# Patient Record
Sex: Male | Born: 1935 | Race: White | Hispanic: No | Marital: Married | Smoking: Never smoker
Health system: Southern US, Community
[De-identification: ages and names within clinical notes are randomized; demographics above are authoritative.]

## PROBLEM LIST (undated history)

## (undated) DIAGNOSIS — G43109 Migraine with aura, not intractable, without status migrainosus: Secondary | ICD-10-CM

## (undated) DIAGNOSIS — I639 Cerebral infarction, unspecified: Secondary | ICD-10-CM

## (undated) DIAGNOSIS — R519 Headache, unspecified: Secondary | ICD-10-CM

## (undated) DIAGNOSIS — K635 Polyp of colon: Secondary | ICD-10-CM

## (undated) DIAGNOSIS — N183 Chronic kidney disease, stage 3 unspecified: Secondary | ICD-10-CM

## (undated) DIAGNOSIS — E876 Hypokalemia: Secondary | ICD-10-CM

## (undated) DIAGNOSIS — E785 Hyperlipidemia, unspecified: Secondary | ICD-10-CM

## (undated) DIAGNOSIS — R51 Headache: Secondary | ICD-10-CM

## (undated) DIAGNOSIS — G43909 Migraine, unspecified, not intractable, without status migrainosus: Secondary | ICD-10-CM

## (undated) DIAGNOSIS — Z9289 Personal history of other medical treatment: Secondary | ICD-10-CM

## (undated) DIAGNOSIS — R42 Dizziness and giddiness: Secondary | ICD-10-CM

## (undated) DIAGNOSIS — I10 Essential (primary) hypertension: Secondary | ICD-10-CM

## (undated) DIAGNOSIS — R339 Retention of urine, unspecified: Secondary | ICD-10-CM

## (undated) DIAGNOSIS — I5189 Other ill-defined heart diseases: Secondary | ICD-10-CM

## (undated) DIAGNOSIS — M109 Gout, unspecified: Secondary | ICD-10-CM

## (undated) DIAGNOSIS — Z8673 Personal history of transient ischemic attack (TIA), and cerebral infarction without residual deficits: Secondary | ICD-10-CM

## (undated) DIAGNOSIS — R55 Syncope and collapse: Secondary | ICD-10-CM

## (undated) DIAGNOSIS — Z8669 Personal history of other diseases of the nervous system and sense organs: Secondary | ICD-10-CM

## (undated) DIAGNOSIS — R739 Hyperglycemia, unspecified: Secondary | ICD-10-CM

## (undated) DIAGNOSIS — I4892 Unspecified atrial flutter: Secondary | ICD-10-CM

## (undated) HISTORY — DX: Hyperlipidemia, unspecified: E78.5

## (undated) HISTORY — DX: Migraine, unspecified, not intractable, without status migrainosus: G43.909

## (undated) HISTORY — DX: Headache: R51

## (undated) HISTORY — DX: Cerebral infarction, unspecified: I63.9

## (undated) HISTORY — PX: EYE SURGERY: SHX253

## (undated) HISTORY — DX: Personal history of other medical treatment: Z92.89

## (undated) HISTORY — DX: Headache, unspecified: R51.9

## (undated) HISTORY — PX: COLONOSCOPY: SHX174

## (undated) HISTORY — DX: Polyp of colon: K63.5

## (undated) HISTORY — DX: Hyperglycemia, unspecified: R73.9

## (undated) HISTORY — DX: Unspecified atrial flutter: I48.92

---

## 1998-08-26 ENCOUNTER — Ambulatory Visit (HOSPITAL_COMMUNITY): Admission: RE | Admit: 1998-08-26 | Discharge: 1998-08-26 | Payer: Self-pay | Admitting: Gastroenterology

## 2002-11-02 ENCOUNTER — Emergency Department (HOSPITAL_COMMUNITY): Admission: EM | Admit: 2002-11-02 | Discharge: 2002-11-03 | Payer: Self-pay | Admitting: Emergency Medicine

## 2004-12-22 ENCOUNTER — Encounter (INDEPENDENT_AMBULATORY_CARE_PROVIDER_SITE_OTHER): Payer: Self-pay | Admitting: Specialist

## 2004-12-22 ENCOUNTER — Ambulatory Visit (HOSPITAL_COMMUNITY): Admission: RE | Admit: 2004-12-22 | Discharge: 2004-12-22 | Payer: Self-pay | Admitting: Gastroenterology

## 2005-11-01 ENCOUNTER — Encounter: Admission: RE | Admit: 2005-11-01 | Discharge: 2005-11-01 | Payer: Self-pay | Admitting: Internal Medicine

## 2007-02-17 DIAGNOSIS — Z8673 Personal history of transient ischemic attack (TIA), and cerebral infarction without residual deficits: Secondary | ICD-10-CM

## 2007-02-17 HISTORY — DX: Personal history of transient ischemic attack (TIA), and cerebral infarction without residual deficits: Z86.73

## 2007-03-02 ENCOUNTER — Emergency Department (HOSPITAL_COMMUNITY): Admission: EM | Admit: 2007-03-02 | Discharge: 2007-03-02 | Payer: Self-pay | Admitting: *Deleted

## 2007-03-04 ENCOUNTER — Ambulatory Visit: Payer: Self-pay | Admitting: *Deleted

## 2007-03-04 ENCOUNTER — Observation Stay (HOSPITAL_COMMUNITY): Admission: EM | Admit: 2007-03-04 | Discharge: 2007-03-05 | Payer: Self-pay | Admitting: Emergency Medicine

## 2011-01-31 NOTE — Consult Note (Signed)
NAME:  Adrian Melendez, Adrian Melendez NO.:  [id number]   MEDICAL RECORD NO.:  [id number]          PATIENT TYPE:  INP   LOCATION:  4707                         FACILITY:  [facility]   PHYSICIAN:  Deanna Artis. Hickling, M.D.DATE OF BIRTH:  Feb 05, 1935   DATE OF CONSULTATION:  03/04/2007  DATE OF DISCHARGE:                                 CONSULTATION   CHIEF COMPLAINT:  Left brain TIA.   HISTORY OF THE PRESENT CONDITION:  I was asked by Dr. Eloise Harman to see  Mr. Luckey, who is a 76 year old right-handed married gentleman, who  had a right vertex headache on Friday, February 28, 2005.  He took Tylenol  and went to sleep.  The next morning, while reading, he had some  problems with word-finding.  This lasted for no more than 5 minutes.  In  very quick sequence he then had paresthesias in the right hand and  forearm, and later in the corner of his right mouth.  The entire cluster  of behaviors lasted for no more than 30 minutes.   He believed that he had a TIA event and went to the hospital.  I have  reviewed the ER record and it only mentions numbness in the hand and  mouth; it does not mention the garbled speech on June 14.   The patient was sent home with a diagnosis of hypertension.  He was  advised to see his primary doctor.  He worried about his condition and  later that night had a migrainous aura followed by a mild to moderate  headache.  He decided to go to the emergency room where he was evaluated  by Dr. Lynelle Doctor, who elected to keep in the hospital until he could be seen  by Dr. Eloise Harman, who decided to admit him.   PAST MEDICAL HISTORY:  Remarkable for hypertension, dyslipidemia,  migraine with aura and migraine variants, colonic polyps.   PAST SURGICAL HISTORY:  None.   CURRENT MEDICATIONS:  1. Coreg 25 twice daily.  2. Zestoretic 40/25 daily.  3. KCl 40 mEq daily.  4. Norvasc 10 mg daily.   DRUG ALLERGIES:  None known.   FAMILY HISTORY:  Father died in his 32s during  World War II.  Mother  died in her 23s.  No history of early coronary artery disease, diabetes  mellitus, colon cancer.  There is a family history of dyslipidemia.   REVIEW OF SYSTEMS:  Positive for dry cough, rhinitis, chronic left  shoulder and ankle pain.  Twelve-system review is otherwise negative  except as noted above.   SOCIAL HISTORY:  The patient is married.  He has a 74 year old daughter.  He does not use alcohol.  He used tobacco years ago when he was young.  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  He has some abnormality in his right  eye that sounded vascular and was treated with laser.  He does not have  macular degeneration.   On admission he had a blood pressure 192/108.  It is considerably  improved.  Zestoretic was changed to Micardis/hydrochlorothiazide 80/25.  His fasting lipid panel was carried out and shows  evidence of  cholesterol of 248, triglyceride 210, HDL cholesterol 33, LDL 173, VLDL  42.  All three glucose determinations are slightly elevated in the 117-  120 range.  This suggests a mild glucose intolerance.  Cardiac enzymes  were negative.   I have reviewed his MRI scan of the brain and it shows significant white  matter disease, a bit more prominent on the left than the right.  He has  three separate lacunes that are cystic near the head of the left  caudate.  There are no acute lesions.  His vessels are ectatic and show  no evidence of occlusion in the intracranial contents.  Carotid Doppler  showed no hemodynamically significant stenosis in the carotids and  antegrade flow in the vertebrals.  A 2-D echocardiogram is pending at  this time.   EXAMINATION TODAY:  GENERAL:  This is a pleasant gentleman, no acute  distress.  VITAL SIGNS:  Blood pressure 132/82, resting pulse 57, respirations 20,  oxygen saturation 94% on room air.  Weight 100.7 kg. height 71 inches.  HEENT:  No bruits, meningismus, infection.  He has a supple neck.  LUNGS:  Clear.  HEART:   No murmurs, pulses normal.  ABDOMEN:  Soft, protuberant.  Bowel sounds normal.  No  hepatosplenomegaly.  EXTREMITIES:  Normal.  NEUROLOGIC EXAMINATION:  Mental status:  Awake, alert, without  dysphasia, dyspraxia, or dysarthria.  Cranial nerves:  Round reactive  pupils.  Visual fields full to double simultaneous stimuli.  Symmetric  facial strength.  Midline tongue.  Air conduction greater than bone  conduction.  Motor examination:  Normal strength.  Fine motor movements  normal.  No drift.  Sensation shows a mild peripheral polyneuropathy to  the stocking and glove distribution.  He has intact stereoagnosis.  Cerebellar examination:  Good finger-to-nose, rapid repetitive  movements.  Gait was normal.  Deep tendon reflexes are diminished.  The  patient had bilateral flexor plantar responses.   IMPRESSION:  1. Transient ischemic attack, left brain. (435.8)  2. Migraine with aura. (345.00)  3. Migraine variant.(345.20)  4. Hypertension. (404.10)  5. Dyslipidemia.  6. Mild glucose intolerance.  7. 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   PLAN:  I agree with treatment with aspirin, blood pressure control,  vigorous lipid control with a statin medication.  We will check  hemoglobin A1c.  The 2-D echocardiogram needs to be reviewed when it is  available.  The patient can be discharged tomorrow because his workup is  complete.   I appreciate the opportunity to participate in his care.  I will follow  him up at your request.      Deanna Artis. Sharene Skeans, M.D.  Electronically Signed     [facility]/MEDQ  D:  03/04/2007  T:  03/05/2007  Job:  [id number]

## 2011-02-03 NOTE — Op Note (Signed)
NAME:  Adrian Melendez, Adrian Melendez NO.:  [id number]   MEDICAL RECORD NO.:  [id number]          PATIENT TYPE:  AMB   LOCATION:  ENDO                         FACILITY:  [facility]   PHYSICIAN:  James L. Malon Kindle., M.D.DATE OF BIRTH:  [birth year]   DATE OF PROCEDURE:  12/22/2004  DATE OF DISCHARGE:                                 OPERATIVE REPORT   PROCEDURE:  Colonoscopy with polypectomy.   MEDICATIONS:  Fentanyl 100 mcg, Versed 8.5 mg IV.   INDICATIONS:  Previous history of adenomatous colon polyps.   DESCRIPTION OF PROCEDURE:  The procedure had been explained to the patient  and consent obtained.   With the patient in the left lateral decubitus position, the Olympus scope  was inserted and advanced.  We were able to advance to the cecum easily.  The ileocecal valve and appendiceal orifice were seen.  The  scope was  withdrawn and the cecum, ascending colon, transverse colon were seen well  and were normal.  No polyps were seen.  In the proximal descending colon, a  0.5 cm sessile polyp was removed through the snare and sucked through the  scope.  No other polyps were seen.  The remainder of the descending and  sigmoid colon were seen well.  No polyps were seen.  The rectum was free of  polyps.  The scope was withdrawn. The patient tolerated the procedure well.   ASSESSMENT:  Descending colon polyp removed.  211.43.   PLAN:  Routine post polypectomy instructions.  Will recommend repeating  procedure in five years and recommend yearly hemoccults.      JLE/MEDQ  D:  12/22/2004  T:  12/22/2004  Job:  [id number]   cc:   Wilson Singer, M.D.  [address]  [place]  [place] [address]  Fax: [phone]

## 2011-02-03 NOTE — Discharge Summary (Signed)
NAME:  Adrian Melendez, Adrian Melendez NO.:  [id number]   MEDICAL RECORD NO.:  [id number]          PATIENT TYPE:  INP   LOCATION:  4707                         FACILITY:  [facility]   PHYSICIAN:  Barry Dienes. Eloise Harman, M.D.DATE OF BIRTH:  1934-12-03   DATE OF ADMISSION:  03/04/2007  DATE OF DISCHARGE:  03/05/2007                               DISCHARGE SUMMARY   CHIEF COMPLAINT:  Headache and transient numbness of the right hand.   HISTORY OF PRESENT ILLNESS:  The patient is a 76 year old white male who  is right-handed and who on the March 01, 2007 in the evening had a  moderate headache for which she took Tylenol and went to sleep.  On  Saturday, June 14th in the a.m., he had transient word-finding  difficulty with numbness of the right hand and right lip.  He was seen  in the emergency room at that time, and it was felt that his symptoms  were due to a musculoskeletal etiology.  On last evening, he had  moderate headache with blurred vision, so he presented to the emergency  room for evaluation.  He was seen at the [facility] emergency room.  At  the time of my evaluation, he felt fine, was no longer having right hand  numbness or headache or difficulty speaking.   PAST MEDICAL HISTORY:  Hypertension, hyperlipidemia, classic ophthalmic  migraines for 15 years, colon polyps with 2006 colonoscopy.   MEDICATIONS:  Prior to admission:  1. Coreg 25 mg p.o. b.i.d.  2. Zestoretic 40/25 1 tablet p.o. q.a.m.  3. Potassium chloride 40 mEq daily.  4. Norvasc 10 mg daily.   ALLERGIES:  NO KNOWN DRUG ALLERGIES.   PAST SURGICAL HISTORY:  None.   SOCIAL HISTORY:  He is married and has one daughter, age 86 years.  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  He has no history of tobacco or alcohol  abuse.   FAMILY HISTORY:  His father died his 30s of injuries from World War II.  His mother died in her 79s.  He has a family history of hyperlipidemia,  but none of early coronary disease, diabetes  mellitus, or colon cancer.   REVIEW OF SYSTEMS:  Lately, he has had a dry cough, rhinitis, and has  chronic problems with aching in the left shoulder and left ankle, as  well as nocturia.  He denies recent fever, chills, shortness of breath,  chest pain, palpitations, nausea, vomiting, diarrhea, constipation,  anxiety, depression, or symptoms of obstructive sleep apnea.   INITIAL PHYSICAL EXAM:  VITAL SIGNS:  Blood pressure 192/108, pulse 56,  respirations 16, temperature 98.5, pulse oxygen saturation 95% on room  air.  GENERAL:  He is a mildly overweight white male, who had an occasional  dry cough.  HEENT:  Exam was within normal limits.  NECK:  Supple and without jugular venous distension or carotid bruit.  CHEST:  Clear to auscultation.  HEART:  Had a irregular rate and rhythm without significant murmur or  gallop.  ABDOMEN:  Normal bowel sounds and no hepatosplenomegaly or tenderness.  EXTREMITIES:  Trace right ankle edema.  NEUROLOGICAL:  He is alert and oriented x4, cranial nerves II-XII were  normal, sensory exam was grossly normal, he had intact finger-to-nose  testing bilaterally, motor strength was 5/5 throughout, gait was normal.   LABORATORY STUDIES:  Serum sodium 139, potassium 3.8, chloride 108, BUN  13, creatinine 1.0, glucose 117, white blood cells 6.8, hemoglobin 14,  hematocrit 43, platelets 292.  A CT scan of the head without IV contrast  was unremarkable.  EKG showed normal sinus rhythm   HOSPITAL COURSE:  The patient's symptoms were consistent with transient  ischemic attacks, so he was admitted to a medical bed with telemetry.  He was also seen by a neurology consultant who recommended initiation of  aspirin treatment, aggressive control of hypertension and elevated  lipids, as well as the imaging studies described below.  On June 16, she  had a transthoracic echocardiogram that showed the following:  Left  ventricular ejection fraction of 60-65%, mild left  ventricular  hypertrophy, mild aortic valve sclerosis without stenosis, left atrial  enlargement with left atrial diameter 45 mm, mild right ventricular  enlargement and mild right atrial enlargement.  He also had a carotid  ultrasound exam that showed bilaterally no evidence of ICA stenosis and  antegrade vertebral artery flow.  A MRI scan of the brain showed no  acute infarcts with old lacunar infarcts in the left caudate.  A MRA  scan of the brain showed possible left posterior cerebral artery  stenosis and mild dilatation of the basilar artery.  A fasting lipid  panel showed total cholesterol 248, with triglycerides 210, HDL 33, LDL  173.   COMPLICATIONS:  None.   CONDITION ON DISCHARGE:  He feels great and has not had any symptoms  suggestive of TIAs, during this hospitalization.  His telemetry has  showed a sinus rhythm throughout his stay.  Most recent vital signs  include blood pressure 141/80, pulse 60, respirations 18, temperature  97.5, pulse oxygen saturation 93% on room air.  His chest is clear to  auscultation.  His heart has a regular rate and rhythm.  His abdomen is  benign, and neurological exam shows no focal deficits.   DISCHARGE DIAGNOSES:  1. Transient ischemic attacks.  2. 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  3. Hypertension, essential, uncontrolled.  4. Hyperlipidemia.  5. Metabolic syndrome.  6. History of classic migraine headaches.  7. Colon polyps.   DISCHARGE MEDICATIONS:  1. Micardis HCT 80/25 1 tablet p.o. q.a.m.  2. Coreg 25 mg p.o. b.i.d.  3. Norvasc 10 mg p.o. daily.  4. Potassium chloride 40 mEq daily.  5. Ecotrin 325 mg daily.  6. Simvastatin 80 mg p.o. daily.  7. Tylenol 500 mg q.i.d. p.r.n. pain or headaches.   DISPOSITION AND FOLLOWUP:  The patient will be seen in followup by Dr.  Jarome Matin at [facility], within 1 week following  discharge.  He was also advised to schedule a follow-up appointment with  Dr.  Ellison Carwin at [facility], in 2 to 4 weeks  following discharge and was given a telephone number to schedule an  appointment.           ______________________________  Barry Dienes. Eloise Harman, M.D.     DGP/MEDQ  D:  04/30/2007  T:  05/01/2007  Job:  [id number]   cc:   Fayrene Fearing L. Malon Kindle., M.D.  Lyn Records, M.D.  Deanna Artis. Sharene Skeans, M.D.

## 2011-07-05 LAB — CBC
HCT: 43.3
Hemoglobin: 14.8
MCV: 91.2
RBC: 4.75
WBC: 6.8

## 2011-07-05 LAB — DIFFERENTIAL
Basophils Absolute: 0
Eosinophils Absolute: 0.2
Lymphs Abs: 1.6
Monocytes Relative: 9
Neutro Abs: 4.4
Neutrophils Relative %: 64

## 2011-07-05 LAB — COMPREHENSIVE METABOLIC PANEL
ALT: 29
AST: 34
BUN: 10
CO2: 26
Calcium: 9.3
GFR calc non Af Amer: >60
Total Bilirubin: 1
Total Protein: 7.4

## 2011-07-05 LAB — URINALYSIS, ROUTINE W REFLEX MICROSCOPIC
Glucose, UA: NEGATIVE
Nitrite: NEGATIVE
pH: 6.5

## 2011-07-05 LAB — I-STAT 8, (EC8 V) (CONVERTED LAB)
Acid-Base Excess: 1
Bicarbonate: 24.2 — ABNORMAL HIGH
Chloride: 108
Glucose, Bld: 117 — ABNORMAL HIGH
HCT: 44
TCO2: 25

## 2011-07-05 LAB — LIPID PANEL: Total CHOL/HDL Ratio: 7.5

## 2011-08-03 ENCOUNTER — Other Ambulatory Visit: Payer: Self-pay | Admitting: Gastroenterology

## 2012-02-29 ENCOUNTER — Encounter (HOSPITAL_COMMUNITY): Payer: Self-pay | Admitting: *Deleted

## 2012-02-29 ENCOUNTER — Observation Stay (HOSPITAL_COMMUNITY): Payer: Medicare Other

## 2012-02-29 ENCOUNTER — Observation Stay (HOSPITAL_COMMUNITY)
Admission: EM | Admit: 2012-02-29 | Discharge: 2012-03-01 | Disposition: A | Discharge status: Home / Self Care | Payer: Medicare Other | Source: Ambulatory Visit | Attending: Cardiovascular Disease | Admitting: Cardiovascular Disease

## 2012-02-29 DIAGNOSIS — R55 Syncope and collapse: Principal | ICD-10-CM

## 2012-02-29 DIAGNOSIS — I10 Essential (primary) hypertension: Secondary | ICD-10-CM | POA: Insufficient documentation

## 2012-02-29 DIAGNOSIS — R110 Nausea: Secondary | ICD-10-CM | POA: Insufficient documentation

## 2012-02-29 DIAGNOSIS — E876 Hypokalemia: Secondary | ICD-10-CM | POA: Insufficient documentation

## 2012-02-29 DIAGNOSIS — R42 Dizziness and giddiness: Secondary | ICD-10-CM

## 2012-02-29 DIAGNOSIS — Z8673 Personal history of transient ischemic attack (TIA), and cerebral infarction without residual deficits: Secondary | ICD-10-CM | POA: Insufficient documentation

## 2012-02-29 DIAGNOSIS — M109 Gout, unspecified: Secondary | ICD-10-CM | POA: Insufficient documentation

## 2012-02-29 HISTORY — DX: Personal history of transient ischemic attack (TIA), and cerebral infarction without residual deficits: Z86.73

## 2012-02-29 HISTORY — DX: Personal history of other diseases of the nervous system and sense organs: Z86.69

## 2012-02-29 HISTORY — DX: Essential (primary) hypertension: I10

## 2012-02-29 HISTORY — DX: Dizziness and giddiness: R42

## 2012-02-29 HISTORY — DX: Hypokalemia: E87.6

## 2012-02-29 HISTORY — DX: Syncope and collapse: R55

## 2012-02-29 HISTORY — DX: Gout, unspecified: M10.9

## 2012-02-29 LAB — BASIC METABOLIC PANEL
Chloride: 100 mEq/L (ref 96–112)
Creatinine, Ser: 1.01 mg/dL (ref 0.50–1.35)
GFR calc Af Amer: 82 mL/min — ABNORMAL LOW (ref >90)
GFR calc non Af Amer: 71 mL/min — ABNORMAL LOW (ref >90)
Glucose, Bld: 120 mg/dL — ABNORMAL HIGH (ref 70–99)
Potassium: 3.2 mEq/L — ABNORMAL LOW (ref 3.5–5.1)
Sodium: 139 mEq/L (ref 135–145)

## 2012-02-29 LAB — CBC
HCT: 41.4 % (ref 39.0–52.0)
HCT: 40 % (ref 39.0–52.0)
Hemoglobin: 14.2 g/dL (ref 13.0–17.0)
MCH: 31.8 pg (ref 26.0–34.0)
MCH: 31.7 pg (ref 26.0–34.0)
MCHC: 35.7 g/dL (ref 30.0–36.0)
Platelets: 210 10*3/uL (ref 150–400)
RBC: 4.65 MIL/uL (ref 4.22–5.81)
RBC: 4.48 MIL/uL (ref 4.22–5.81)
WBC: 9 10*3/uL (ref 4.0–10.5)

## 2012-02-29 LAB — CARDIAC PANEL(CRET KIN+CKTOT+MB+TROPI)
CK, MB: 2.3 ng/mL (ref 0.3–4.0)
Troponin I: <0.3 ng/mL (ref <0.30)

## 2012-02-29 LAB — CREATININE, SERUM: Creatinine, Ser: 0.99 mg/dL (ref 0.50–1.35)

## 2012-02-29 LAB — DIFFERENTIAL
Eosinophils Absolute: 0.1 10*3/uL (ref 0.0–0.7)
Eosinophils Relative: 1 % (ref 0–5)
Monocytes Relative: 6 % (ref 3–12)

## 2012-02-29 LAB — HEPATIC FUNCTION PANEL
Alkaline Phosphatase: 102 U/L (ref 39–117)
Indirect Bilirubin: 0.4 mg/dL (ref 0.3–0.9)
Total Bilirubin: 0.5 mg/dL (ref 0.3–1.2)
Total Protein: 7.2 g/dL (ref 6.0–8.3)

## 2012-02-29 LAB — PROTIME-INR
INR: 1.08 (ref 0.00–1.49)
Prothrombin Time: 14.2 seconds (ref 11.6–15.2)

## 2012-02-29 LAB — POCT I-STAT TROPONIN I

## 2012-02-29 MED ORDER — HYDROCHLOROTHIAZIDE 25 MG PO TABS
25.0000 mg | ORAL_TABLET | Freq: Every day | ORAL | Status: DC
  Administered 2012-03-01: 25 mg via ORAL
  Filled 2012-02-29: qty 1

## 2012-02-29 MED ORDER — AMLODIPINE BESYLATE 10 MG PO TABS
10.0000 mg | ORAL_TABLET | Freq: Every day | ORAL | Status: DC
  Administered 2012-02-29 – 2012-03-01 (×2): 10 mg via ORAL
  Filled 2012-02-29 (×2): qty 1

## 2012-02-29 MED ORDER — SODIUM CHLORIDE 0.9 % IV SOLN: INTRAVENOUS | Status: DC

## 2012-02-29 MED ORDER — TELMISARTAN-HCTZ 80-25 MG PO TABS: 1.0000 | ORAL_TABLET | Freq: Every day | ORAL | Status: DC

## 2012-02-29 MED ORDER — ONDANSETRON HCL 4 MG/2ML IJ SOLN: 4.0000 mg | Freq: Four times a day (QID) | INTRAMUSCULAR | Status: DC | PRN

## 2012-02-29 MED ORDER — POTASSIUM CHLORIDE CRYS ER 20 MEQ PO TBCR
40.0000 meq | EXTENDED_RELEASE_TABLET | Freq: Once | ORAL | Status: AC
  Administered 2012-02-29: 40 meq via ORAL

## 2012-02-29 MED ORDER — ASPIRIN 81 MG PO CHEW
324.0000 mg | CHEWABLE_TABLET | ORAL | Status: AC
  Administered 2012-02-29: 324 mg via ORAL
  Filled 2012-02-29: qty 4

## 2012-02-29 MED ORDER — ONDANSETRON HCL 4 MG/2ML IJ SOLN
INTRAMUSCULAR | Status: AC
  Administered 2012-02-29: 14:00:00
  Filled 2012-02-29: qty 2

## 2012-02-29 MED ORDER — SODIUM CHLORIDE 0.9 % IV BOLUS (SEPSIS)
500.0000 mL | Freq: Once | INTRAVENOUS | Status: AC
  Administered 2012-02-29: 500 mL via INTRAVENOUS

## 2012-02-29 MED ORDER — ASPIRIN EC 81 MG PO TBEC
81.0000 mg | DELAYED_RELEASE_TABLET | Freq: Every day | ORAL | Status: DC
  Administered 2012-03-01: 81 mg via ORAL
  Filled 2012-02-29: qty 1

## 2012-02-29 MED ORDER — ACETAMINOPHEN 325 MG PO TABS: 650.0000 mg | ORAL_TABLET | ORAL | Status: DC | PRN

## 2012-02-29 MED ORDER — IRBESARTAN 300 MG PO TABS
300.0000 mg | ORAL_TABLET | Freq: Every day | ORAL | Status: DC
  Administered 2012-03-01: 300 mg via ORAL
  Filled 2012-02-29: qty 1

## 2012-02-29 MED ORDER — ALPRAZOLAM 0.25 MG PO TABS: 0.2500 mg | ORAL_TABLET | Freq: Two times a day (BID) | ORAL | Status: DC | PRN

## 2012-02-29 MED ORDER — METOPROLOL TARTRATE 12.5 MG HALF TABLET
12.5000 mg | ORAL_TABLET | Freq: Two times a day (BID) | ORAL | Status: DC
  Administered 2012-02-29 – 2012-03-01 (×2): 12.5 mg via ORAL
  Filled 2012-02-29 (×3): qty 1

## 2012-02-29 MED ORDER — ALLOPURINOL 150 MG HALF TABLET
450.0000 mg | ORAL_TABLET | Freq: Every day | ORAL | Status: DC
  Administered 2012-02-29 – 2012-03-01 (×2): 450 mg via ORAL
  Filled 2012-02-29 (×2): qty 1

## 2012-02-29 MED ORDER — POTASSIUM CHLORIDE CRYS ER 20 MEQ PO TBCR
20.0000 meq | EXTENDED_RELEASE_TABLET | Freq: Every day | ORAL | Status: DC
  Administered 2012-03-01: 20 meq via ORAL
  Filled 2012-02-29 (×3): qty 1

## 2012-02-29 MED ORDER — ASPIRIN 300 MG RE SUPP
300.0000 mg | RECTAL | Status: AC
  Filled 2012-02-29: qty 1

## 2012-02-29 MED ORDER — ONDANSETRON HCL 4 MG/2ML IJ SOLN
4.0000 mg | Freq: Once | INTRAMUSCULAR | Status: AC
  Administered 2012-02-29: 4 mg via INTRAVENOUS
  Filled 2012-02-29: qty 2

## 2012-02-29 MED ORDER — NITROGLYCERIN 0.4 MG SL SUBL: 0.4000 mg | SUBLINGUAL_TABLET | SUBLINGUAL | Status: DC | PRN

## 2012-02-29 MED ORDER — ZOLPIDEM TARTRATE 5 MG PO TABS: 5.0000 mg | ORAL_TABLET | Freq: Every evening | ORAL | Status: DC | PRN

## 2012-02-29 MED ORDER — HEPARIN SODIUM (PORCINE) 5000 UNIT/ML IJ SOLN
5000.0000 [IU] | Freq: Three times a day (TID) | INTRAMUSCULAR | Status: DC
  Administered 2012-02-29 – 2012-03-01 (×3): 5000 [IU] via SUBCUTANEOUS
  Filled 2012-02-29 (×5): qty 1

## 2012-02-29 NOTE — H&P (Signed)
Adrian Melendez is an 77 y.o. male.    PCP Dr. Ivery Quale  Chief Complaint: near syncope with lightheadedness and dizziness HPI: 77 year old male presents to the ER with weakness, dizziness, nausea and near syncope. He has a history of HTN. Remote work up many years ago with stress test that was normal.  Also with hyperlipidemia but statins have caused leg weakness.  He has tried several statins and currently is not taking any.  After receiving medication (zofran) for nausea his nausea and dizziness have resolved.  He does have occ. PVC on monitor-he stated that he has had premature beats for years.  Today he felt well until sudden onset of lightheadedness.  This increased, especially if he looked into the sun.  He stated that these symptoms today are different than previous migraines, TIA, and CVA.   Denies any chest pain, no SOB.  EMS was called and pt. Presented to ER.  EKG SR, no acute EKG changes, occ. PVC on second EKG.  First EKG with static on baseline, but was SR.  Initial troponin I is negative.   Past Medical History  Diagnosis Date  . Hypertension   . Gout   . Dizziness 02/29/2012  . Syncope, near 02/29/2012  . HTN (hypertension) 02/29/2012  . Hx-TIA (transient ischemic attack) 02/29/2012  . CVA, old, alterations of sensations   . Hx of migraines     History reviewed. No pertinent past surgical history.  History reviewed. No pertinent family history. His father died in his 59's from injuries in WWII.  Mother died in her 46's, unsure of cause of death.  His grandfather did die with MI, one sister with elevated cholesterol.  Social History:  reports that he has never smoked. He does not have any smokeless tobacco history on file. He reports that he does not drink alcohol or use illicit drugs. 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   Allergies: No Known Allergies  Outpatient Medications:  Allopurinol 300 mg daily ? 450 daily Norvasc 10 mg  daily Colchicine 0.6 mg prn  Naprosyn 500 mg prn arthritis pain Micardis/hct 80-25mg  1 daily K+  But he is not taking currently.   Results for orders placed during the hospital encounter of 02/29/12 (from the past 48 hour(s))  CBC     Status: Normal   Collection Time   02/29/12  3:10 PM      Component Value Range Comment   WBC 9.0  4.0 - 10.5 K/uL    RBC 4.65  4.22 - 5.81 MIL/uL    Hemoglobin 14.8  13.0 - 17.0 g/dL    HCT 16.1  09.6 - 04.5 %    MCV 89.0  78.0 - 100.0 fL    MCH 31.8  26.0 - 34.0 pg    MCHC 35.7  30.0 - 36.0 g/dL    RDW 40.9  81.1 - 91.4 %    Platelets 210  150 - 400 K/uL   DIFFERENTIAL     Status: Abnormal   Collection Time   02/29/12  3:10 PM      Component Value Range Comment   Neutrophils Relative 80 (*) 43 - 77 %    Neutro Abs 7.2  1.7 - 7.7 K/uL    Lymphocytes Relative 13  12 - 46 %    Lymphs Abs 1.1  0.7 - 4.0 K/uL    Monocytes Relative 6  3 - 12 %    Monocytes Absolute 0.6  0.1 - 1.0 K/uL    Eosinophils Relative 1  0 - 5 %    Eosinophils Absolute 0.1  0.0 - 0.7 K/uL    Basophils Relative 0  0 - 1 %    Basophils Absolute 0.0  0.0 - 0.1 K/uL   BASIC METABOLIC PANEL     Status: Abnormal   Collection Time   02/29/12  3:10 PM      Component Value Range Comment   Sodium 139  135 - 145 mEq/L    Potassium 3.2 (*) 3.5 - 5.1 mEq/L    Chloride 100  96 - 112 mEq/L    CO2 20  19 - 32 mEq/L    Glucose, Bld 120 (*) 70 - 99 mg/dL    BUN 17  6 - 23 mg/dL    Creatinine, Ser 1.61  0.50 - 1.35 mg/dL    Calcium 9.6  8.4 - 09.6 mg/dL    GFR calc non Af Amer 71 (*) >90 mL/min    GFR calc Af Amer 82 (*) >90 mL/min   POCT I-STAT TROPONIN I     Status: Normal   Collection Time   02/29/12  3:21 PM      Component Value Range Comment   Troponin i, poc 0.00  0.00 - 0.08 ng/mL    Comment 3             No results found.  ROS: General:no colds or fevers Skin:no rashes or ulcers HEENT:no blurred vision no double vision CV:no chest pain PUL:no SOB GI:no diarhhea, no  constipation, no melena  GU:no hematuria, no dysuria MS:hx of gout and arthritis none now Neuro:no syncope Endo:no diabetes, no thyroid disease   Blood pressure 155/80, pulse 75, temperature 97.6 F (36.4 C), temperature source Oral, resp. rate 16, SpO2 95.00%. PE: General:alert and oriented , pleasant affect, no acute distress Skin:W&D, brisk capillary refill HEENT:normocephalic, sclera clear Neck:supple, no JVD, no bruits Heart:S1S2 RRR with occ. Premature beats, soft systolic murmur 2/6 sem lsb Lungs:harsh breath sounds, no rales. No wheezes EAV:WUJWJ soft non tender very prominent diastasis recti Ext:no edema, 2+ pedal pulses bil. Neuro:A&OX 3, MAE, + facial symmetry, grips, equal, push pull of arms and legs equal.     Assessment/Plan Patient Active Problem List  Diagnosis  . Dizziness associated with nausea  . Syncope, near  . HTN (hypertension)  . Hx-TIA (transient ischemic attack)   PLAN: Admit to tele, monitor, serial CKMBs 2 D echo, VTE pro.   INGOLD,LAURA R 02/29/2012, 4:32 PM   Patient seen and examined. Agree with assessment and plan. 77 yr old WM who presents today after experiencing dizziness and lightheadeness without frank vertigo. This occurred while in car. He was unaware of any rhythm disturbance. No associated paresthesias or weakness. ECG and monitor have shown isolated PAC's and PVC's in ER. He is hypokalemic which may be contributing to arrhythmia; check Mg.  Will admit for observation. 2d echo to assess murmur for which he was unaware. He has h/o TIA/CVA wiill obtain head CT. Doubt ischemic. Will emperically add low dose beta-blocker.   Lennette Bihari, MD, [facility] 02/29/2012 5:11 PM

## 2012-02-29 NOTE — ED Notes (Addendum)
Pt. Reports sudden onset of dizziness, lightheadedness and nausea apporx 1300. Pt states " I feel lightheaded sometimes, from medication, but never like this.  Denies SOB. Denies Chest pain. Sensitive to light. Reports "seeing spots sometimes". Denies headache.  A. O. X 4. NAD. Hxt of hypertension.

## 2012-02-29 NOTE — ED Provider Notes (Signed)
History     CSN: [id number]  Arrival date & time 02/29/12  1322   First MD Initiated Contact with Patient 02/29/12 1408      Chief Complaint  Patient presents with  . Dizziness  . Nausea    Patient is a 77 y.o. male presenting with weakness. The history is provided by the patient and a relative.  Weakness The primary symptoms include dizziness and nausea. Primary symptoms do not include headaches, syncope, loss of consciousness, visual change, focal weakness or vomiting. Episode onset: several hours ago. The symptoms are unchanged. The neurological symptoms are diffuse.  Dizziness also occurs with nausea and weakness. Dizziness does not occur with vomiting.   Additional symptoms include weakness.  worsened by - movement Improved with rest  Pt reports onset of dizziness, feeling faint and nausea Denies cp/sob.  Denies headache.  Denies focal weakness.  No visual change.  Denies vertigo   Past Medical History  Diagnosis Date  . Hypertension   . Gout     History reviewed. No pertinent past surgical history.  History reviewed. No pertinent family history.  History  Substance Use Topics  . Smoking status: Never Smoker   . Smokeless tobacco: Not on file  . Alcohol Use: No      Review of Systems  Cardiovascular: Negative for syncope.  Gastrointestinal: Positive for nausea. Negative for vomiting.  Neurological: Positive for dizziness and weakness. Negative for focal weakness, loss of consciousness and headaches.  All other systems reviewed and are negative.    Allergies  Review of patient's allergies indicates no known allergies.  Home Medications   Current Outpatient Rx  Name Route Sig Dispense Refill  . NAPROXEN 250 MG PO TABS Oral Take 250 mg by mouth daily as needed. For pain      BP 160/70  Pulse 79  Temp 97.6 F (36.4 C) (Oral)  Resp 16  SpO2 100%  Physical Exam CONSTITUTIONAL: Well developed/well nourished HEAD AND FACE:  Normocephalic/atraumatic EYES: EOMI/PERRL ENMT: Mucous membranes moist NECK: supple no meningeal signs SPINE:entire spine nontender CV: S1/S2 noted, no murmurs/rubs/gallops noted LUNGS: Lungs are clear to auscultation bilaterally, no apparent distress ABDOMEN: soft, nontender, no rebound or guarding GU:no cva tenderness NEURO: Pt is awake/alert, moves all extremitiesx4 No arm/leg drift Face is symmetric No past pointing EXTREMITIES: pulses normal, full ROM SKIN: warm, color normal PSYCH: no abnormalities of mood noted  ED Course  Procedures   Labs Reviewed  DIFFERENTIAL - Abnormal; Notable for the following:    Neutrophils Relative 80 (*)     All other components within normal limits  CBC  POCT I-STAT TROPONIN I  BASIC METABOLIC PANEL  pt now improved but did report near syncopal event He is known to [facility], will admit to their service D/w PA Annie Paras to see in ED   MDM  Nursing notes including past medical history and social history reviewed and considered in documentation All labs/vitals reviewed and considered      Date: 02/29/2012  Rate: 78  Rhythm: normal sinus rhythm  QRS Axis: normal  Intervals: normal  ST/T Wave abnormalities: nonspecific ST changes  Conduction Disutrbances:none  Narrative Interpretation:   Old EKG Reviewed: changes noted Artifact noted, but appears to be sinus rhythm      Joya Gaskins, MD 02/29/12 [phone]

## 2012-02-29 NOTE — ED Notes (Signed)
Pt. Reports decreased nausea. Denies lightheadedness and dizziness.

## 2012-02-29 NOTE — ED Notes (Signed)
Pt from home-was driving when he had sudden onset of Nausea-no vomiting this am.  Reports dizziness with movement.  No CP/SOB.  Vitals stable-150s systolic.  CBG 87.  20Lwrist.  4mg  zofran.

## 2012-02-29 NOTE — ED Notes (Signed)
Pt reports driving, having a sudden onset of nausea-denies vomiting.  Pt reports being dizzy with movement and having increased dizziness with light.  Pt denies abdominal pain or chest pain.  No acute distress noted.

## 2012-02-29 NOTE — ED Notes (Signed)
Pt helped to stand at bedside with little assistance to use urinal.  Pt returned to bed with bed rails up and locked

## 2012-03-01 HISTORY — PX: TRANSTHORACIC ECHOCARDIOGRAM: SHX275

## 2012-03-01 LAB — HEMOGLOBIN A1C
Hgb A1c MFr Bld: 5.9 % — ABNORMAL HIGH (ref <5.7)
Mean Plasma Glucose: 123 mg/dL — ABNORMAL HIGH (ref <117)

## 2012-03-01 LAB — CARDIAC PANEL(CRET KIN+CKTOT+MB+TROPI)
CK, MB: 2.8 ng/mL (ref 0.3–4.0)
CK, MB: 2.8 ng/mL (ref 0.3–4.0)
Total CK: 93 U/L (ref 7–232)
Total CK: 122 U/L (ref 7–232)
Troponin I: <0.3 ng/mL (ref <0.30)
Troponin I: <0.3 ng/mL (ref <0.30)

## 2012-03-01 LAB — BASIC METABOLIC PANEL
CO2: 26 mEq/L (ref 19–32)
Chloride: 103 mEq/L (ref 96–112)
GFR calc non Af Amer: 66 mL/min — ABNORMAL LOW (ref >90)
Glucose, Bld: 100 mg/dL — ABNORMAL HIGH (ref 70–99)
Potassium: 3.3 mEq/L — ABNORMAL LOW (ref 3.5–5.1)
Sodium: 143 mEq/L (ref 135–145)

## 2012-03-01 LAB — CBC
HCT: 41.5 % (ref 39.0–52.0)
Hemoglobin: 14.1 g/dL (ref 13.0–17.0)
MCHC: 34 g/dL (ref 30.0–36.0)
RBC: 4.57 MIL/uL (ref 4.22–5.81)
WBC: 7 10*3/uL (ref 4.0–10.5)

## 2012-03-01 LAB — LIPID PANEL
Cholesterol: 212 mg/dL — ABNORMAL HIGH (ref 0–200)
LDL Cholesterol: 134 mg/dL — ABNORMAL HIGH (ref 0–99)
VLDL: 43 mg/dL — ABNORMAL HIGH (ref 0–40)

## 2012-03-01 MED ORDER — POTASSIUM CHLORIDE CRYS ER 20 MEQ PO TBCR: 20.0000 meq | EXTENDED_RELEASE_TABLET | Freq: Every day | ORAL | Status: AC

## 2012-03-01 MED ORDER — METOPROLOL TARTRATE 25 MG PO TABS: 12.5000 mg | ORAL_TABLET | Freq: Two times a day (BID) | ORAL | Status: DC

## 2012-03-01 MED ORDER — ASPIRIN 81 MG PO TBEC: 81.0000 mg | DELAYED_RELEASE_TABLET | Freq: Every day | ORAL | Status: AC

## 2012-03-01 MED ORDER — POTASSIUM CHLORIDE CRYS ER 20 MEQ PO TBCR
40.0000 meq | EXTENDED_RELEASE_TABLET | Freq: Once | ORAL | Status: AC
  Administered 2012-03-01: 40 meq via ORAL
  Filled 2012-03-01: qty 2

## 2012-03-01 NOTE — Discharge Instructions (Signed)
Heart Healthy Diet.  Follow up with Dr. Jarold Motto in 1-2 weeks.  Call our office if any further problems before your appointments  Have blood work done Monday or Tuesday to check potassium, at Dr. Norval Gable office or the 1st floor of Dr. Hazle Coca office after you come to have monitor placed.

## 2012-03-01 NOTE — Progress Notes (Signed)
Discharge instructions given to pt and family. Pt verbalized understanding. Ready for discharge

## 2012-03-01 NOTE — Discharge Summary (Signed)
Physician Discharge Summary  Patient ID: Adrian Melendez MRN: [id number] DOB/AGE: [birth year] 77 y.o.  Admit date: 02/29/2012 Discharge date: 03/01/2012  Discharge Diagnoses:  Principal Problem:  *Syncope, near Active Problems:  Dizziness associated with nausea  Hypokalemia  HTN (hypertension)  Hx-TIA (transient ischemic attack)   Discharged Condition: good  Hospital Course:  77 year old male presents to the ER with weakness, dizziness, nausea and near syncope.  He has a history of HTN. Remote work up many years ago with stress test that was normal. Also with hyperlipidemia but statins have caused leg weakness. He has tried several statins and currently is not taking any. After receiving medication (zofran) for nausea his nausea and dizziness have resolved. He does have occ. PVC on monitor-he stated that he has had premature beats for years.  Day of admit he felt well until sudden onset of lightheadedness. This increased, especially if he looked into the sun. He stated that these symptoms today are different than previous migraines, TIA, and CVA. Denies any chest pain, no SOB. EMS was called and pt. Presented to ER.  EKG SR, no acute EKG changes, occ. PVC on second EKG. First EKG with static on baseline, but was SR.   Patient was admitted for observation overnight where he maintained sinus rhythm with occasional PVCs.  He had no further dizziness lightheadedness no chest pain at all and no shortness of breath.  CT of the head was negative for acute process.  Cardiac enzymes are negative. Patient was seen by Dr. Allyson Sabal.  2-D echo of the heart was within normal limits patient was felt stable and ready for discharge home he'll obtain an event monitor from our office on Monday, June 17 and then follow up with Dr. Allyson Sabal.  Patient was hypokalemic and replaced twice during hospitalization.  Additionally his LDL continues to be elevated at 134 he has been intolerant to statins.  Consults:  None  Significant Diagnostic Studies:  2D echo:  Study Conclusions  - Left ventricle: The cavity size was normal. Wall thickness was normal. Systolic function was normal. The estimated ejection fraction was in the range of 55% to 60%. Wall motion was normal; there were no regional wall motion abnormalities. Left ventricular diastolic function parameters were normal. - Aortic valve: Mild regurgitation. - Mitral valve: Calcified annulus.  CT of the head 02/29/2012:  IMPRESSION:  Chronic atrophy and microvascular ischemic changes. No interval  change or acute process   Chest x-ray was done cardiomegaly without heart failure.  Laboratory data sodium 143 potassium 3.3 chloride 103 CO2 26 BUN 15 creatinine 1.07 calcium 9.3 magnesium 1.5 LFTs were normal Cardiac enzymes were negative with troponin I less than 0.30x3  Total cholesterol 212 triglycerides 216 HDL 35 LDL 134  Hemoglobin 14.1 hematocrit 41.5 WBC 7.0 platelets 223  Hemoglobin A1c 5.9 TSH 1.857  Discharge Exam: Blood pressure 136/72, pulse 55, temperature 97.3 F (36.3 C), temperature source Oral, resp. rate 16, height 5\' 11"  (1.803 m), weight 100.699 kg (222 lb), SpO2 94.00%.    General appearance: alert, cooperative and appears stated age  Neck: no adenopathy, no carotid bruit, no JVD, supple, symmetrical, trachea midline and thyroid not enlarged, symmetric, no tenderness/mass/nodules  Lungs: clear to auscultation bilaterally  Heart: regular rate and rhythm, S1, S2 normal, no murmur, click, rub or gallop  Extremities: extremities normal, atraumatic, no cyanosis or edema  Pulses: 2+ and symmetric    Disposition:  home   Medication List  As of 03/01/2012  4:43 PM  TAKE these medications         allopurinol 300 MG tablet   Commonly known as: ZYLOPRIM   Take 450 mg by mouth daily.      amLODipine 10 MG tablet   Commonly known as: NORVASC   Take 10 mg by mouth daily.      aspirin 81 MG EC tablet   Take 1  tablet (81 mg total) by mouth daily.      colchicine 0.6 MG tablet   Take 0.6 mg by mouth daily.      metoprolol tartrate 25 MG tablet   Commonly known as: LOPRESSOR   Take 0.5 tablets (12.5 mg total) by mouth 2 (two) times daily.      naproxen 250 MG tablet   Commonly known as: NAPROSYN   Take 500 mg by mouth daily as needed. For pain      potassium chloride SA 20 MEQ tablet   Commonly known as: K-DUR,KLOR-CON   Take 1 tablet (20 mEq total) by mouth daily.      telmisartan-hydrochlorothiazide 80-25 MG per tablet   Commonly known as: MICARDIS HCT   Take 1 tablet by mouth daily.           Follow-up Information    Follow up with Runell Gess, MD on 03/04/2012. (to place heart monitor, at 3:00 pm)    Contact information:   [address] [place] [address] [phone]       Follow up with Runell Gess, MD on 03/15/2012. (at  4:00 pm    to see Dr. Allyson Sabal for follow up.)    Contact information:   [address] [place] [address] [phone]         DISCHARGE INSTRUCTIONS: Heart Healthy Diet.  Follow up with Dr. Jarold Motto in 1-2 weeks.  Call our office if any further problems before your appointments  Have blood work done Monday or Tuesday to check potassium, at Dr. Norval Gable office or the 1st floor of Dr. Hazle Coca office after you come to have monitor placed.       SignedLeone Brand 03/01/2012, 4:43 PM

## 2012-03-01 NOTE — Progress Notes (Signed)
Subjective:  No CP/SOB or recurrent dizziness  Objective:  Temp:  [97.6 F (36.4 C)-99.2 F (37.3 C)] 98.6 F (37 C) (06/14 0425) Pulse Rate:  [55-80] 55  (06/14 0425) Resp:  [16-18] 18  (06/14 0425) BP: (155-175)/(70-80) 158/75 mmHg (06/14 0425) SpO2:  [92 %-100 %] 93 % (06/14 0425) Weight:  [100.699 kg (222 lb)-102.2 kg (225 lb 5 oz)] 100.699 kg (222 lb) (06/14 0425) Weight change:   Intake/Output from previous day: 06/13 0701 - 06/14 0700 In: 600 [P.O.:600] Out: 1150 [Urine:1150]  Intake/Output from this shift:    Physical Exam: General appearance: alert, cooperative and appears stated age Neck: no adenopathy, no carotid bruit, no JVD, supple, symmetrical, trachea midline and thyroid not enlarged, symmetric, no tenderness/mass/nodules Lungs: clear to auscultation bilaterally Heart: regular rate and rhythm, S1, S2 normal, no murmur, click, rub or gallop Extremities: extremities normal, atraumatic, no cyanosis or edema Pulses: 2+ and symmetric  Lab Results: Results for orders placed during the hospital encounter of 02/29/12 (from the past 48 hour(s))  CBC     Status: Normal   Collection Time   02/29/12  3:10 PM      Component Value Range Comment   WBC 9.0  4.0 - 10.5 K/uL    RBC 4.65  4.22 - 5.81 MIL/uL    Hemoglobin 14.8  13.0 - 17.0 g/dL    HCT 16.1  09.6 - 04.5 %    MCV 89.0  78.0 - 100.0 fL    MCH 31.8  26.0 - 34.0 pg    MCHC 35.7  30.0 - 36.0 g/dL    RDW 40.9  81.1 - 91.4 %    Platelets 210  150 - 400 K/uL   DIFFERENTIAL     Status: Abnormal   Collection Time   02/29/12  3:10 PM      Component Value Range Comment   Neutrophils Relative 80 (*) 43 - 77 %    Neutro Abs 7.2  1.7 - 7.7 K/uL    Lymphocytes Relative 13  12 - 46 %    Lymphs Abs 1.1  0.7 - 4.0 K/uL    Monocytes Relative 6  3 - 12 %    Monocytes Absolute 0.6  0.1 - 1.0 K/uL    Eosinophils Relative 1  0 - 5 %    Eosinophils Absolute 0.1  0.0 - 0.7 K/uL    Basophils Relative 0  0 - 1 %    Basophils  Absolute 0.0  0.0 - 0.1 K/uL   BASIC METABOLIC PANEL     Status: Abnormal   Collection Time   02/29/12  3:10 PM      Component Value Range Comment   Sodium 139  135 - 145 mEq/L    Potassium 3.2 (*) 3.5 - 5.1 mEq/L    Chloride 100  96 - 112 mEq/L    CO2 20  19 - 32 mEq/L    Glucose, Bld 120 (*) 70 - 99 mg/dL    BUN 17  6 - 23 mg/dL    Creatinine, Ser 7.82  0.50 - 1.35 mg/dL    Calcium 9.6  8.4 - 95.6 mg/dL    GFR calc non Af Amer 71 (*) >90 mL/min    GFR calc Af Amer 82 (*) >90 mL/min   POCT I-STAT TROPONIN I     Status: Normal   Collection Time   02/29/12  3:21 PM      Component Value Range Comment   Troponin i,  poc 0.00  0.00 - 0.08 ng/mL    Comment 3            CARDIAC PANEL(CRET KIN+CKTOT+MB+TROPI)     Status: Normal   Collection Time   02/29/12  4:46 PM      Component Value Range Comment   Total CK 71  7 - 232 U/L    CK, MB 2.3  0.3 - 4.0 ng/mL    Troponin I <0.30  <0.30 ng/mL    Relative Index RELATIVE INDEX IS INVALID  0.0 - 2.5   HEPATIC FUNCTION PANEL     Status: Normal   Collection Time   02/29/12  4:46 PM      Component Value Range Comment   Total Protein 7.2  6.0 - 8.3 g/dL    Albumin 4.1  3.5 - 5.2 g/dL    AST 21  0 - 37 U/L    ALT 17  0 - 53 U/L    Alkaline Phosphatase 102  39 - 117 U/L    Total Bilirubin 0.5  0.3 - 1.2 mg/dL    Bilirubin, Direct 0.1  0.0 - 0.3 mg/dL    Indirect Bilirubin 0.4  0.3 - 0.9 mg/dL   PROTIME-INR     Status: Normal   Collection Time   02/29/12  4:46 PM      Component Value Range Comment   Prothrombin Time 14.2  11.6 - 15.2 seconds    INR 1.08  0.00 - 1.49   MAGNESIUM     Status: Normal   Collection Time   02/29/12  4:46 PM      Component Value Range Comment   Magnesium 1.5  1.5 - 2.5 mg/dL   TSH     Status: Normal   Collection Time   02/29/12  4:46 PM      Component Value Range Comment   TSH 1.857  0.350 - 4.500 uIU/mL   CBC     Status: Normal   Collection Time   02/29/12  5:40 PM      Component Value Range Comment   WBC 8.3   4.0 - 10.5 K/uL    RBC 4.48  4.22 - 5.81 MIL/uL    Hemoglobin 14.2  13.0 - 17.0 g/dL    HCT 16.1  09.6 - 04.5 %    MCV 89.3  78.0 - 100.0 fL    MCH 31.7  26.0 - 34.0 pg    MCHC 35.5  30.0 - 36.0 g/dL    RDW 40.9  81.1 - 91.4 %    Platelets 206  150 - 400 K/uL   CREATININE, SERUM     Status: Abnormal   Collection Time   02/29/12  5:40 PM      Component Value Range Comment   Creatinine, Ser 0.99  0.50 - 1.35 mg/dL    GFR calc non Af Amer 79 (*) >90 mL/min    GFR calc Af Amer >90  >90 mL/min   CARDIAC PANEL(CRET KIN+CKTOT+MB+TROPI)     Status: Normal   Collection Time   03/01/12 12:27 AM      Component Value Range Comment   Total CK 93  7 - 232 U/L    CK, MB 2.8  0.3 - 4.0 ng/mL    Troponin I <0.30  <0.30 ng/mL    Relative Index RELATIVE INDEX IS INVALID  0.0 - 2.5   CBC     Status: Normal   Collection Time   03/01/12  6:00 AM  Component Value Range Comment   WBC 7.0  4.0 - 10.5 K/uL    RBC 4.57  4.22 - 5.81 MIL/uL    Hemoglobin 14.1  13.0 - 17.0 g/dL    HCT 16.1  09.6 - 04.5 %    MCV 90.8  78.0 - 100.0 fL    MCH 30.9  26.0 - 34.0 pg    MCHC 34.0  30.0 - 36.0 g/dL    RDW 40.9  81.1 - 91.4 %    Platelets 223  150 - 400 K/uL   BASIC METABOLIC PANEL     Status: Abnormal   Collection Time   03/01/12  6:00 AM      Component Value Range Comment   Sodium 143  135 - 145 mEq/L    Potassium 3.3 (*) 3.5 - 5.1 mEq/L    Chloride 103  96 - 112 mEq/L    CO2 26  19 - 32 mEq/L    Glucose, Bld 100 (*) 70 - 99 mg/dL    BUN 15  6 - 23 mg/dL    Creatinine, Ser 7.82  0.50 - 1.35 mg/dL    Calcium 9.3  8.4 - 95.6 mg/dL    GFR calc non Af Amer 66 (*) >90 mL/min    GFR calc Af Amer 77 (*) >90 mL/min   LIPID PANEL     Status: Abnormal   Collection Time   03/01/12  6:00 AM      Component Value Range Comment   Cholesterol 212 (*) 0 - 200 mg/dL    Triglycerides 213 (*) <150 mg/dL    HDL 35 (*) >08 mg/dL    Total CHOL/HDL Ratio 6.1      VLDL 43 (*) 0 - 40 mg/dL    LDL Cholesterol 657 (*) 0  - 99 mg/dL     Imaging: Imaging results have been reviewed  Assessment/Plan:   1. Principal Problem: 2.  *Syncope, near 3. Active Problems: 4.  Dizziness associated with nausea 5.  HTN (hypertension) 6.  Hx-TIA (transient ischemic attack) 7.  Hypokalemia 8.   Time Spent Directly with Patient:  20 minutes  Length of Stay:  LOS: 1 day   Pt admitted with dizziness. Symptoms resolved with hydration. He has had frequent episodes similar to this w/o syncope. Denies CP/SOB. EKG/CXR and head CT w/o abnormality. K+ low. Exam benign. 2D pending this AM. OK to be D/Cd home later today. Replete K. OP event monitor. ROV with me 2 weeks.  Sani Madariaga J 03/01/2012, 8:30 AM

## 2012-03-01 NOTE — Care Management Note (Unsigned)
    Page 1 of 1   03/01/2012     9:06:44 AM   CARE MANAGEMENT NOTE 03/01/2012  Patient:  Adrian Melendez, Adrian Melendez   Account Number:  [id number]  Date Initiated:  03/01/2012  Documentation initiated by:  SIMMONS,Licet Dunphy  Subjective/Objective Assessment:   ADMITTED WITH NEAR SYNCOPE; LIVES AT HOME WITH WIFE -Adrian Melendez; IPTA; USES [facility] ON [address].     Action/Plan:   DISCHARGE PLANNING INITIATED.   Anticipated DC Date:  03/02/2012   Anticipated DC Plan:  HOME/SELF CARE      DC Planning Services  CM consult      Choice offered to / List presented to:             Status of service:  In process, will continue to follow Medicare Important Message given?   (If response is "NO", the following Medicare IM given date fields will be blank) Date Medicare IM given:   Date Additional Medicare IM given:    Discharge Disposition:    Per UR Regulation:  Reviewed for med. necessity/level of care/duration of stay  If discussed at Long Length of Stay Meetings, dates discussed:    Comments:  03/01/12 0900  Kairie Vangieson SIMMONS RN, BSN [phone] NCM WILL FOLLOW.

## 2012-03-01 NOTE — Progress Notes (Signed)
  Echocardiogram 2D Echocardiogram has been performed.  Adrian Melendez A 03/01/2012, 10:21 AM

## 2012-03-28 DIAGNOSIS — Z9289 Personal history of other medical treatment: Secondary | ICD-10-CM

## 2012-03-28 HISTORY — DX: Personal history of other medical treatment: Z92.89

## 2013-05-28 ENCOUNTER — Ambulatory Visit (INDEPENDENT_AMBULATORY_CARE_PROVIDER_SITE_OTHER): Payer: Medicare Other | Admitting: Cardiology

## 2013-05-28 ENCOUNTER — Encounter: Payer: Self-pay | Admitting: Cardiology

## 2013-05-28 VITALS — BP 160/70 | HR 72 | Ht 71.0 in | Wt 232.7 lb

## 2013-05-28 DIAGNOSIS — I4891 Unspecified atrial fibrillation: Secondary | ICD-10-CM

## 2013-05-28 DIAGNOSIS — E785 Hyperlipidemia, unspecified: Secondary | ICD-10-CM

## 2013-05-28 DIAGNOSIS — I480 Paroxysmal atrial fibrillation: Secondary | ICD-10-CM

## 2013-05-28 DIAGNOSIS — Z7901 Long term (current) use of anticoagulants: Secondary | ICD-10-CM

## 2013-05-28 DIAGNOSIS — R42 Dizziness and giddiness: Secondary | ICD-10-CM

## 2013-05-28 DIAGNOSIS — Z8673 Personal history of transient ischemic attack (TIA), and cerebral infarction without residual deficits: Secondary | ICD-10-CM

## 2013-05-28 DIAGNOSIS — I10 Essential (primary) hypertension: Secondary | ICD-10-CM

## 2013-05-28 NOTE — Patient Instructions (Signed)
Your physician recommends that you schedule a follow-up appointment in:6 months with Dr Berry 

## 2013-05-28 NOTE — Assessment & Plan Note (Signed)
He is not on a beta blocker

## 2013-05-28 NOTE — Assessment & Plan Note (Signed)
No recurrent sustained PAF episodes, only occasional brief "flutter"

## 2013-05-28 NOTE — Assessment & Plan Note (Addendum)
132/80 on repeat

## 2013-05-28 NOTE — Progress Notes (Signed)
05/28/2013 Adrian Melendez   1935/07/07  [id number]  Primary Physicia Garlan Fillers, MD Primary Cardiologist: Dr Allyson Sabal  HPI:  78 y/o who had near syncope June 2013. He was seen in the ER. He was in NSR then. A follow up Myoview and echo were normal July 2013. He did have PAF with pauses on a Holter monitor. Dr Allyson Sabal felt he was at enough risk that he was placed on Xarelto, (he had a documented TIA in 2008 and has HTN). He has done well since. He is not on a beta blocker. He has only occasional "fluttering".   Current Outpatient Prescriptions  Medication Sig Dispense Refill  . allopurinol (ZYLOPRIM) 300 MG tablet Take 450 mg by mouth daily.      [name] amLODipine (NORVASC) 10 MG tablet Take 10 mg by mouth daily.      . colchicine 0.6 MG tablet Take 0.6 mg by mouth daily.      . potassium chloride SA (K-DUR,KLOR-CON) 20 MEQ tablet Take 20 mEq by mouth daily.      [name] telmisartan-hydrochlorothiazide (MICARDIS HCT) 80-25 MG per tablet Take 1 tablet by mouth daily.      [name] 20 MG TABS tablet Take 20 mg by mouth daily.       No current facility-administered medications for this visit.    Allergies  Allergen Reactions  . Statins     Cause leg weakness     History   Social History  . Marital Status: Married    Spouse Name: N/A    Number of Children: N/A  . Years of Education: N/A   Occupational History  . Not on file.   Social History Main Topics  . Smoking status: Never Smoker   . Smokeless tobacco: Not on file  . Alcohol Use: No  . Drug Use: No  . Sexual Activity: Yes   Other Topics Concern  . Not on file   Social History Narrative  . No narrative on file     Review of Systems: General: negative for chills, fever, night sweats or weight changes.  Cardiovascular: negative for chest pain, dyspnea on exertion, edema, orthopnea, palpitations, paroxysmal nocturnal dyspnea or shortness of breath Dermatological: negative for rash Respiratory: negative for cough or  wheezing Urologic: negative for hematuria Abdominal: negative for nausea, vomiting, diarrhea, bright red blood per rectum, melena, or hematemesis Neurologic: negative for visual changes, syncope, or dizziness All other systems reviewed and are otherwise negative except as noted above.He denies snoring or daytime somnolence. His says his does not report that he has significant apnea or snoring.    Blood pressure 160/70, pulse 72, height 5\' 11"  (1.803 m), weight 232 lb 11.2 oz (105.552 kg).  General appearance: alert, cooperative and no distress Lungs: clear to auscultation bilaterally Heart: regular rate and rhythm Abdomen: obese  EKG NSR, LAD  ASSESSMENT AND PLAN:   PAF (paroxysmal atrial fibrillation) by Holter June 2012 No recurrent sustained PAF episodes, only occasional brief "flutter"  Chronic anticoagulation- Xarelto .  Hx-TIA (transient ischemic attack) June 2008 .  HTN (hypertension) 132/80 on repeat  Dizziness associated with nausea june 2013- suspected to be secondary to PAF with pauses He is not on a beta blocker  Dyslipidemia- statin intol .   PLAN  Same cardiac Rx, Avoid OTC decongestants or stimulants. F/U Dr Allyson Sabal in 6 months.  Adrian Melendez KPA-C 05/28/2013 2:15 PM

## 2013-05-29 ENCOUNTER — Encounter: Payer: Self-pay | Admitting: Cardiovascular Disease

## 2013-06-05 ENCOUNTER — Other Ambulatory Visit: Payer: Self-pay | Admitting: Cardiology

## 2013-06-05 NOTE — Telephone Encounter (Signed)
Rx was sent to pharmacy electronically. 

## 2013-06-10 ENCOUNTER — Encounter: Payer: Self-pay | Admitting: *Deleted

## 2013-06-11 ENCOUNTER — Encounter: Payer: Self-pay | Admitting: Cardiology

## 2013-06-11 ENCOUNTER — Ambulatory Visit (INDEPENDENT_AMBULATORY_CARE_PROVIDER_SITE_OTHER): Payer: Medicare Other | Admitting: Cardiology

## 2013-06-11 VITALS — BP 160/90 | HR 74 | Ht 71.0 in | Wt 232.6 lb

## 2013-06-11 DIAGNOSIS — I10 Essential (primary) hypertension: Secondary | ICD-10-CM

## 2013-06-11 DIAGNOSIS — Z7901 Long term (current) use of anticoagulants: Secondary | ICD-10-CM

## 2013-06-11 DIAGNOSIS — Z8673 Personal history of transient ischemic attack (TIA), and cerebral infarction without residual deficits: Secondary | ICD-10-CM

## 2013-06-11 DIAGNOSIS — R42 Dizziness and giddiness: Secondary | ICD-10-CM

## 2013-06-11 DIAGNOSIS — I4891 Unspecified atrial fibrillation: Secondary | ICD-10-CM

## 2013-06-11 NOTE — Patient Instructions (Addendum)
Keep your appointment with Dr Allyson Sabal in 6 months.

## 2013-06-11 NOTE — Progress Notes (Signed)
06/11/2013 Adrian Melendez   1934-10-05  [id number]  Primary Physicia Adrian Fillers, MD Primary Cardiologist: Dr Adrian Melendez  HPI:  The patient is a 78 year old, moderately overweight, married Caucasian male, father of 1, grandfather to 1 granddaughter who I just saw a few weeks ago. He came back today because he had some more questions about atrial fibrillation.. 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 His risk factors include hypertension, hyperlipidemia intolerant to statin drugs. He had an episode of presyncope requiring hospitalization back in June. A 2D echo was normal, as was a head CT. A monitor showed PVCs. He was begun on a low dose beta blocker. Myoview performed in our office March 28, 2012, was nonischemic. However, event monitor did show PAF with fairly long pauses which would certainly explain his symptoms. Dr Adrian Melendez elected to put him on Xarelto and discontinued his beta blocker. Since that time, he has felt clinically improve  He still has occasional episodes that last "a few seconds".    Current Outpatient Prescriptions  Medication Sig Dispense Refill  . allopurinol (ZYLOPRIM) 300 MG tablet Take 450 mg by mouth daily.      [name] amLODipine (NORVASC) 10 MG tablet Take 10 mg by mouth daily.      . colchicine 0.6 MG tablet Take 0.6 mg by mouth daily.      . potassium chloride SA (K-DUR,KLOR-CON) 20 MEQ tablet TAKE ONE TABLET BY MOUTH ONE TIME DAILY  30 tablet  11  . telmisartan-hydrochlorothiazide (MICARDIS HCT) 80-25 MG per tablet Take 1 tablet by mouth daily.      Adrian Melendez 20 MG TABS tablet Take 20 mg by mouth daily.       No current facility-administered medications for this visit.    Allergies  Allergen Reactions  . Statins     Cause leg weakness     History   Social History  . Marital Status: Married    Spouse Name: N/A    Number of Children: 1  . 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 Smoking  status: Never Smoker   . Smokeless tobacco: Not on file  . Alcohol Use: No  . Drug Use: No  . Sexual Activity: Yes   Other Topics Concern  . Not on file   Social History Narrative  . No narrative on file     Review of Systems: General: negative for chills, fever, night sweats or weight changes.  Cardiovascular: negative for chest pain, dyspnea on exertion, edema, orthopnea, palpitations, paroxysmal nocturnal dyspnea or shortness of breath Dermatological: negative for rash Respiratory: negative for cough or wheezing Urologic: negative for hematuria Abdominal: negative for nausea, vomiting, diarrhea, bright red blood per rectum, melena, or hematemesis Neurologic: negative for visual changes, syncope, or dizziness All other systems reviewed and are otherwise negative except as noted above. He does not snore, he denies day somnolence.   Blood pressure 160/90, pulse 74, height 5\' 11"  (1.803 m), weight 232 lb 9.6 oz (105.507 kg).  General appearance: alert, cooperative, no distress and moderately obese Heart: regular rate and rhythm  EKG NSR, 1st degree AVB  ASSESSMENT AND PLAN:   Dizziness associated with nausea june 2013- suspected to be secondary to PAF with pauses .  HTN (hypertension) .  Chronic anticoagulation- Xarelto .  Hx-TIA (transient ischemic attack) June 2008 .   PLAN  I answered Adrian Melendez questions and we discussed different aspects of the treatment  and symptoms of PAF. He'll keep his appointment with Dr Adrian Melendez in 6 months.  Adrian Melendez KPA-C 06/11/2013 2:40 PM

## 2013-06-12 ENCOUNTER — Encounter: Payer: Self-pay | Admitting: Cardiology

## 2013-06-30 ENCOUNTER — Other Ambulatory Visit: Payer: Self-pay | Admitting: Pharmacist Clinician (PhC)/ Clinical Pharmacy Specialist

## 2013-07-31 LAB — IFOBT (OCCULT BLOOD): IFOBT: NEGATIVE

## 2013-08-04 ENCOUNTER — Other Ambulatory Visit: Payer: Self-pay | Admitting: Gastroenterology

## 2013-11-28 ENCOUNTER — Encounter: Payer: Self-pay | Admitting: Cardiovascular Disease

## 2013-11-28 ENCOUNTER — Ambulatory Visit (INDEPENDENT_AMBULATORY_CARE_PROVIDER_SITE_OTHER): Payer: Medicare Other | Admitting: Cardiovascular Disease

## 2013-11-28 VITALS — BP 171/81 | HR 73 | Ht 71.0 in | Wt 229.0 lb

## 2013-11-28 DIAGNOSIS — Z79899 Other long term (current) drug therapy: Secondary | ICD-10-CM

## 2013-11-28 DIAGNOSIS — I480 Paroxysmal atrial fibrillation: Secondary | ICD-10-CM

## 2013-11-28 DIAGNOSIS — I4891 Unspecified atrial fibrillation: Secondary | ICD-10-CM

## 2013-11-28 DIAGNOSIS — E785 Hyperlipidemia, unspecified: Secondary | ICD-10-CM

## 2013-11-28 DIAGNOSIS — E782 Mixed hyperlipidemia: Secondary | ICD-10-CM

## 2013-11-28 DIAGNOSIS — I10 Essential (primary) hypertension: Secondary | ICD-10-CM

## 2013-11-28 NOTE — Assessment & Plan Note (Signed)
Mildly elevated today though he says he has an element of "whitecoat hypertension. His blood pressure home runs in the 130/80 range

## 2013-11-28 NOTE — Patient Instructions (Signed)
Dr Allyson SabalBerry has ordered some blood work. This needs to be done FASTING.  Your physician recommends that you schedule a follow-up appointment in: 6 months with Corine ShelterLuke Kilroy, PA-C. Dr Allyson SabalBerry wants you to follow-up in 1 year. You will receive a reminder letter in the mail two months in advance. If you don't receive a letter, please call our office to schedule the follow-up appointment.

## 2013-11-28 NOTE — Assessment & Plan Note (Signed)
Into normal sinus rhythm on Xarelto anticoagulations

## 2013-11-28 NOTE — Assessment & Plan Note (Signed)
Not on statin therapy. We will recheck a lipid and liver profile. 

## 2013-11-28 NOTE — Progress Notes (Signed)
11/28/2013 Adrian Melendez   07/11/1935  [id number]  Primary Physician Garlan Fillers, MD Primary Cardiologist: Runell Gess MD Adrian Melendez   HPI:  The patient is a 79 year old, moderately overweight, married Caucasian male, father of 1, grandfather to 1 granddaughter who I just saw a few weeks ago. He came back today because he had some more questions about atrial fibrillation.. 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 His risk factors include hypertension, hyperlipidemia intolerant to statin drugs. He had an episode of presyncope requiring hospitalization back in June. A 2D echo was normal, as was a head CT. A monitor showed PVCs. He was begun on a low dose beta blocker. Myoview performed in our office March 28, 2012, was nonischemic. However, event monitor did show PAF with fairly long pauses which would certainly explain his symptoms. Dr Allyson Sabal elected to put him on Xarelto and discontinued his beta blocker. Since that time, he has felt clinically improve He still has occasional episodes that last "a few seconds". He saw Corine Shelter PA-C in the office back in September. Since that time he has been completely asymptomatic.   Current Outpatient Prescriptions  Medication Sig Dispense Refill  . allopurinol (ZYLOPRIM) 300 MG tablet Take 450 mg by mouth daily.      [name] amLODipine (NORVASC) 10 MG tablet Take 10 mg by mouth daily.      . colchicine 0.6 MG tablet Take 0.6 mg by mouth as needed.       . potassium chloride SA (K-DUR,KLOR-CON) 20 MEQ tablet TAKE ONE TABLET BY MOUTH ONE TIME DAILY  30 tablet  11  . telmisartan-hydrochlorothiazide (MICARDIS HCT) 80-25 MG per tablet Take 1 tablet by mouth daily.      Adrian Melendez 20 MG TABS tablet TAKE 1 TABLET BY MOUTH EVERY DAY  30 tablet  5   No current facility-administered medications for this visit.    Allergies  Allergen Reactions  . Statins     Cause leg weakness     History   Social History  . Marital Status: Married   Spouse Name: N/A    Number of Children: 1  . 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 Smoking status: Never Smoker   . Smokeless tobacco: Not on file  . Alcohol Use: No  . Drug Use: No  . Sexual Activity: Yes   Other Topics Concern  . Not on file   Social History Narrative  . No narrative on file     Review of Systems: General: negative for chills, fever, night sweats or weight changes.  Cardiovascular: negative for chest pain, dyspnea on exertion, edema, orthopnea, palpitations, paroxysmal nocturnal dyspnea or shortness of breath Dermatological: negative for rash Respiratory: negative for cough or wheezing Urologic: negative for hematuria Abdominal: negative for nausea, vomiting, diarrhea, bright red blood per rectum, melena, or hematemesis Neurologic: negative for visual changes, syncope, or dizziness All other systems reviewed and are otherwise negative except as noted above.    Blood pressure 171/81, pulse 73, height 5\' 11"  (1.803 m), weight 229 lb (103.874 kg).  General appearance: alert and no distress Neck: no adenopathy, no carotid bruit, no JVD, supple, symmetrical, trachea midline and thyroid not enlarged, symmetric, no tenderness/mass/nodules Lungs: clear to auscultation bilaterally Heart: regular rate and rhythm, S1, S2 normal, no murmur, click, rub or gallop Extremities: extremities normal, atraumatic, no cyanosis or edema  EKG normal sinus rhythm  at 3973 with no ST or T wave changes  ASSESSMENT AND PLAN:   PAF (paroxysmal atrial fibrillation) by Holter June 2012 Into normal sinus rhythm on Xarelto anticoagulations  HTN (hypertension) Mildly elevated today though he says he has an element of "whitecoat hypertension. His blood pressure home runs in the 130/80 range  Dyslipidemia- statin intol Not on statin therapy. We will recheck a lipid and liver profile      Runell GessJonathan J. Berry MD  [facility] Coast Healthcare SystemFACP,FACC,FAHA, [facility] CenterFSCAI 11/28/2013 2:07 PM

## 2013-12-29 ENCOUNTER — Other Ambulatory Visit: Payer: Self-pay | Admitting: Cardiovascular Disease

## 2014-01-27 ENCOUNTER — Other Ambulatory Visit: Payer: Self-pay | Admitting: Cardiovascular Disease

## 2014-01-28 ENCOUNTER — Telehealth: Payer: Self-pay | Admitting: Pharmacist Clinician (PhC)/ Clinical Pharmacy Specialist

## 2014-01-28 NOTE — Telephone Encounter (Signed)
Wanted to verify if we had labs, he has copy at home.

## 2014-01-28 NOTE — Telephone Encounter (Signed)
Pt LMOM for return call.  I had spoke with him earlier in the week, regarding lab work.  Refilled Xarelto, but wanted copy of most recent BMET to be sure dose was correct.

## 2014-04-24 ENCOUNTER — Telehealth: Payer: Self-pay | Admitting: Pharmacist Clinician (PhC)/ Clinical Pharmacy Specialist

## 2014-04-24 NOTE — Telephone Encounter (Signed)
Pt LMOM has some prescription naproxen 550mg  from several years ago, used to take for gout and back problems.  Has not needed for almost 2 years.  Has back ache this week, wanted to know if okay to take.  Rx filled in 2013.    Advised pt that occasional use, with food, is okay.  Suggested he would be better off discarding that bottle, as it it at least 18 months outdated.  Recommended Aleve 220mg  1 tablq12h x 2-3 days max.  With food.  If still problematic, speak to PCP.  Pt voiced understanding

## 2014-06-02 ENCOUNTER — Encounter: Payer: Self-pay | Admitting: Cardiology

## 2014-06-02 ENCOUNTER — Ambulatory Visit (INDEPENDENT_AMBULATORY_CARE_PROVIDER_SITE_OTHER): Payer: Medicare Other | Admitting: Cardiology

## 2014-06-02 VITALS — BP 126/68 | HR 70 | Ht 71.0 in | Wt 223.5 lb

## 2014-06-02 DIAGNOSIS — I480 Paroxysmal atrial fibrillation: Secondary | ICD-10-CM

## 2014-06-02 DIAGNOSIS — I4891 Unspecified atrial fibrillation: Secondary | ICD-10-CM

## 2014-06-02 DIAGNOSIS — E785 Hyperlipidemia, unspecified: Secondary | ICD-10-CM

## 2014-06-02 DIAGNOSIS — Z8673 Personal history of transient ischemic attack (TIA), and cerebral infarction without residual deficits: Secondary | ICD-10-CM

## 2014-06-02 NOTE — Progress Notes (Signed)
06/02/2014 Adrian Melendez   10-13-1934  [id number]  Primary Physicia Adrian Fillers, MD Primary Cardiologist: Dr Adrian Melendez  HPI:  79 year old, moderately overweight, married Caucasian male, father of 1, grandfather to 1 granddaughter who is followed by Dr Adrian Melendez. 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 He had an episode of presyncope requiring hospitalization back in June 2013.  An 2D echo was normal, as was a head CT. A monitor showed PVCs. He was begun on a low dose beta blocker. Myoview performed in our office March 28, 2012, was nonischemic. However, event monitor did show PAF with fairly long pauses which would certainly explain his symptoms. Dr Adrian Melendez elected to put him on Xarelto and discontinued his beta blocker. Since that time, he has done well. He still has occasional episodes that last "a few seconds. He attributes them to some problems he has been having with a Rt eye cataract.     Current Outpatient Prescriptions  Medication Sig Dispense Refill  . allopurinol (ZYLOPRIM) 300 MG tablet Take 450 mg by mouth daily.      [name] amLODipine (NORVASC) 10 MG tablet Take 10 mg by mouth daily.      . colchicine 0.6 MG tablet Take 0.6 mg by mouth as needed.       . potassium chloride SA (K-DUR,KLOR-CON) 20 MEQ tablet TAKE ONE TABLET BY MOUTH ONE TIME DAILY  30 tablet  11  . telmisartan-hydrochlorothiazide (MICARDIS HCT) 80-25 MG per tablet Take 1 tablet by mouth daily.      Adrian Melendez 20 MG TABS tablet TAKE 1 TABLET BY MOUTH EVERY DAY  30 tablet  5   No current facility-administered medications for this visit.    Allergies  Allergen Reactions  . Statins     Cause leg weakness     History   Social History  . Marital Status: Married    Spouse Name: N/A    Number of Children: 1  . 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  Smoking status: Never Smoker   . Smokeless tobacco: Not on file  . Alcohol Use: No  . Drug Use: No  . Sexual Activity: Yes   Other Topics Concern  . Not on file   Social History Narrative  . No narrative on file     Review of Systems: General: negative for chills, fever, night sweats or weight changes.  Cardiovascular: negative for chest pain, dyspnea on exertion, edema, orthopnea, palpitations, paroxysmal nocturnal dyspnea or shortness of breath Dermatological: negative for rash Respiratory: negative for cough or wheezing Urologic: negative for hematuria Abdominal: negative for nausea, vomiting, diarrhea, bright red blood per rectum, melena, or hematemesis Neurologic: negative for visual changes, syncope, or dizziness All other systems reviewed and are otherwise negative except as noted above.    Blood pressure 126/68, pulse 70, height  (1.803 m), weight 223 lb 8 oz (101.379 kg).  General appearance: alert, cooperative, no distress and mildly obese Neck: no carotid bruit and no JVD Lungs: clear to auscultation bilaterally Heart: regular rate and rhythm  EKG NSR, PVCs-rate 70  ASSESSMENT AND PLAN:   PAF (paroxysmal atrial fibrillation) by Holter June 2012 Holding NSR/SB  HTN (hypertension) Controlled  Chronic anticoagulation- Xarelto .  Dyslipidemia- statin intol .  Hx-TIA (transient ischemic attack) June 2008 .   PLAN  Same cardiac Rx. He  is low risk from a cardiac standpoint for cataract surgery if he needs that. He can hold his Xarelto if needed. He has had brief "seconds" dizzy spells and occasional short runs of tachycardia that really don't bother him much. I did not change his medications. He can see Dr Adrian Melendez in 6 months.   Adrian Melendez KPA-C 06/02/2014 12:07 PM

## 2014-06-02 NOTE — Patient Instructions (Addendum)
NO CHANGE WITH CURRENT MEDICATIONS.   Your physician wants you to follow-up in 6 MONTH DR BERRY.  You will receive a reminder letter in the mail two months in advance. If you don't receive a letter, please call our office to schedule the follow-up appointment.

## 2014-06-02 NOTE — Assessment & Plan Note (Signed)
Holding NSR-SB 

## 2014-06-02 NOTE — Assessment & Plan Note (Signed)
Controlled.  

## 2014-08-10 ENCOUNTER — Institutional Professional Consult (permissible substitution): Payer: Medicare Other | Admitting: Neurology

## 2014-08-10 ENCOUNTER — Telehealth: Payer: Self-pay | Admitting: Neurology

## 2014-08-10 NOTE — Telephone Encounter (Signed)
Called and confirmed rescheduled appt. Confirmed with wife

## 2014-08-31 ENCOUNTER — Ambulatory Visit (INDEPENDENT_AMBULATORY_CARE_PROVIDER_SITE_OTHER): Payer: Medicare Other | Admitting: Neurology

## 2014-08-31 ENCOUNTER — Encounter: Payer: Self-pay | Admitting: Neurology

## 2014-08-31 VITALS — BP 178/86 | HR 70 | Temp 97.6°F | Ht 69.0 in | Wt 228.0 lb

## 2014-08-31 DIAGNOSIS — R600 Localized edema: Secondary | ICD-10-CM

## 2014-08-31 DIAGNOSIS — E669 Obesity, unspecified: Secondary | ICD-10-CM

## 2014-08-31 DIAGNOSIS — R0683 Snoring: Secondary | ICD-10-CM

## 2014-08-31 DIAGNOSIS — I480 Paroxysmal atrial fibrillation: Secondary | ICD-10-CM

## 2014-08-31 DIAGNOSIS — G4734 Idiopathic sleep related nonobstructive alveolar hypoventilation: Secondary | ICD-10-CM

## 2014-08-31 DIAGNOSIS — R351 Nocturia: Secondary | ICD-10-CM

## 2014-08-31 NOTE — Progress Notes (Signed)
Subjective:    Patient ID: Adrian Melendez is a 79 y.o. male.  HPI     Adrian Foley, MD, PhD [facility] [address], [address] [address] [place], [place] [address]  Dear Dr. Eloise Harman,   I saw your patient, Adrian Melendez, upon your kind request in my neurologic clinic today for initial consultation of his sleep disorder, in particular, concern for underlying obstructive sleep apnea in the context of an abnormal pulse oximetry test and insomnia reported. The patient is accompanied by his wife today. As you know, Ms. Adrian Melendez is a 79 year old right-handed gentleman with an underlying medical history of hyperlipidemia, history of TIA, paroxysmal A. fib on Xarelto, gout, ophthalmic migraines, hyperglycemia, and obesity, who had a an abnormal pulse oximetry test on 07/03/2014 average oxygen saturation was 89.2%, nadir was 79%. Time below 88% saturation was 161.3 minutes. Total recording time was 5 hours and 34 minutes. He snores very little. He has significant nocturia, at least 3 times per night, every 1 1/2 hours it appears.  He goes to bed around midnight. He does not sleep well and wake up well rested. Primarily he feels his sleep is disrupted by his bathroom breaks. He usually is up around 5 AM but still lays in bed and dozes off until 9 AM. He denies restless leg symptoms. He likes to sleep on his sides. He does not have any gasping sensations in his wife is not sure if he has any problems with his breathing in his sleep. He likes to take a nap in the late morning hours. His Epworth sleepiness score is 5 out of 24 today. He has at times very vivid dreams. For the past month he has noted right leg swelling. He has some on the left as well.   His Past Medical History Is Significant For: Past Medical History  Diagnosis Date  . Hypertension   . Gout   . Dizziness 02/29/2012  . Syncope, near 02/29/2012  . Hx-TIA (transient ischemic attack) June 2008  . Hx of  migraines   . Gout   . Hyperlipidemia     statin intolerance  . History of nuclear stress test 03/28/2012    lexiscan myoview; negative for ischemia  . PAF (paroxysmal atrial fibrillation)     xarelto  . Headache   . Migraines     classic ophthalmic  . Colon polyps     colonoscopy 2006  . Hyperlipidemia   . Hyperglycemia     His Past Surgical History Is Significant For: Past Surgical History  Procedure Laterality Date  . Eye surgery      laser surgery to seal vein   . Transthoracic echocardiogram  03/01/2012    EF 55-60% with normal systolic function; mild AV regurg; calcified mitral annulus - ordered for syncope  . Colonoscopy      2006,11/12    His Family History Is Significant For: Family History  Problem Relation Age of Onset  . Cancer Maternal Grandmother   . Heart disease Maternal Grandfather   . Pneumonia Paternal Grandfather     His Social History Is Significant For: History   Social History  . Marital Status: Married    Spouse Name: N/A    Number of Children: 1  . 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 Smoking status: Never Smoker   . Smokeless tobacco: Never Used  . Alcohol Use: No  .  Drug Use: No  . Sexual Activity: Yes   Other Topics Concern  . None   Social History Narrative   Consumes no caffeine    His Allergies Are:  Allergies  Allergen Reactions  . Statins     Cause leg weakness   :   His Current Medications Are:  Outpatient Encounter Prescriptions as of 08/31/2014  Medication Sig  . allopurinol (ZYLOPRIM) 300 MG tablet Take 450 mg by mouth daily.  Melendez Kitchen. amLODipine (NORVASC) 10 MG tablet Take 10 mg by mouth daily.  . colchicine 0.6 MG tablet Take 0.6 mg by mouth as needed.   . metoprolol succinate (TOPROL-XL) 25 MG 24 hr tablet   . potassium chloride SA (K-DUR,KLOR-CON) 20 MEQ tablet TAKE ONE TABLET BY MOUTH ONE TIME DAILY  . telmisartan-hydrochlorothiazide  (MICARDIS HCT) 80-25 MG per tablet Take 1 tablet by mouth daily.  [name]. XARELTO 20 MG TABS tablet TAKE 1 TABLET BY MOUTH EVERY DAY  :  Review of Systems:  Out of a complete 14 point review of systems, all are reviewed and negative with the exception of these symptoms as listed below:   Review of Systems  Constitutional: Positive for fatigue.  Cardiovascular: Positive for leg swelling.  Gastrointestinal:       Urination problems  Endocrine:       Flushing  Musculoskeletal:       Joint pain    Objective:  Neurologic Exam  Physical Exam Physical Examination:   Filed Vitals:   08/31/14 1322  BP: 178/86  Pulse: 70  Temp: 97.6 F (36.4 C)   General Examination: The patient is a very pleasant 79 y.o. male in no acute distress. He appears well-developed and well-nourished and very well groomed.   HEENT: Normocephalic, atraumatic, pupils are equal, round and reactive to light and accommodation. Funduscopic exam is normal with sharp disc margins noted. Extraocular tracking is good without limitation to gaze excursion or nystagmus noted. Normal smooth pursuit is noted. Hearing is grossly intact. Tympanic membranes are clear bilaterally. Face is symmetric with normal facial animation and normal facial sensation. Speech is clear with no dysarthria noted. There is no hypophonia. There is no lip, neck/head, jaw or voice tremor. Neck is supple with full range of passive and active motion. There are no carotid bruits on auscultation. Oropharynx exam reveals: moderate mouth dryness, adequate dental hygiene and moderate airway crowding, due to wider tongue base, redundant soft palate and tonsils in place which I did not visualize fully. Mallampati is class III. Tongue protrudes centrally and palate elevates symmetrically. Neck size is 16-7/8.   Chest: Clear to auscultation without wheezing, rhonchi or crackles noted.  Heart: S1+S2+0, regular and normal without murmurs, rubs or gallops noted. He has the  occasional extra beat. Generally speaking his pulse and heart rate are regular.  Abdomen: Soft, non-tender and non-distended with normal bowel sounds appreciated on auscultation.  Extremities: There is 2+ pitting edema in the right leg and 1+ edema on the left leg. He has no calf tenderness.  Skin: Warm and dry without trophic changes noted. There are no varicose veins.  Musculoskeletal: exam reveals no obvious joint deformities, tenderness or joint swelling or erythema.   Neurologically:  Mental status: The patient is awake, alert and oriented in all 4 spheres. His immediate and remote memory, attention, language skills and fund of knowledge are appropriate. There is no evidence of aphasia, agnosia, apraxia or anomia. Speech is clear with normal prosody and enunciation. Thought process is linear. Mood  is normal and affect is normal.  Cranial nerves II - XII are as described above under HEENT exam. In addition: shoulder shrug is normal with equal shoulder height noted. Motor exam: Normal bulk, strength and tone is noted. There is no drift, tremor or rebound. Romberg is negative. Reflexes are 2+ throughout. Babinski: Toes are flexor bilaterally. Fine motor skills and coordination: intact with normal finger taps, normal hand movements, normal rapid alternating patting, normal foot taps and normal foot agility.  Cerebellar testing: No dysmetria or intention tremor on finger to nose testing. Heel to shin is unremarkable bilaterally. There is no truncal or gait ataxia.  Sensory exam: intact to light touch, pinprick, vibration, temperature sense in the upper and lower extremities.  Gait, station and balance: He stands easily. No veering to one side is noted. No leaning to one side is noted. Posture is age-appropriate and stance is narrow based. Gait shows normal stride length and normal pace. No problems turning are noted. He turns en bloc. Tandem walk is slightly difficult for him.                 Assessment and Plan:   In summary, Adrian Melendez is a very pleasant 79 y.o.-year old male with an underlying medical history of hyperlipidemia, history of TIA, paroxysmal A. fib on Xarelto, gout, ophthalmic migraines, hyperglycemia, and obesity, who reports mild snoring, significant nocturia, nonrestorative sleep and had a recent abnormal pulse oximetry test overnight. His history and physical exam are concerning for underlying obstructive sleep apnea (OSA). I had a long chat with the patient and his wife about my findings and the diagnosis of OSA, its prognosis and treatment options. We talked about medical treatments, surgical interventions and non-pharmacological approaches. I explained in particular the risks and ramifications of untreated moderate to severe OSA, especially with respect to developing cardiovascular disease down the Road, including congestive heart failure, difficult to treat hypertension, cardiac arrhythmias, or stroke. Even type 2 diabetes has, in part, been linked to untreated OSA. Symptoms of untreated OSA include daytime sleepiness, memory problems, mood irritability and mood disorder such as depression and anxiety, lack of energy, as well as recurrent headaches, especially morning headaches. We talked about trying to maintain a healthy lifestyle in general, as well as the importance of weight control. I encouraged the patient to eat healthy, exercise daily and keep well hydrated, to keep a scheduled bedtime and wake time routine, to not skip any meals and eat healthy snacks in between meals. I advised the patient not to drive when feeling sleepy. I recommended the following at this time: sleep study with potential positive airway pressure titration. (We will score hypopneas at 4% and split the sleep study into diagnostic and treatment portion, if the estimated. 2 hour AHI is >15/h). He's concerned about not being able to sleep with all the sensors and wires. I tried to reassure  him.  I explained the sleep test procedure to the patient and also outlined possible surgical and non-surgical treatment options of OSA, including the use of a custom-made dental device (which would require a referral to a specialist dentist or oral surgeon), upper airway surgical options, such as pillar implants, radiofrequency surgery, tongue base surgery, and UPPP (which would involve a referral to an ENT surgeon). Rarely, jaw surgery such as mandibular advancement may be considered.  I also explained the CPAP treatment option to the patient, who indicated that he would be reluctant but willing to try CPAP if the need arises.  I explained the importance of being compliant with PAP treatment, not only for insurance purposes but primarily to improve His symptoms, and for the patient's long term health benefit, including to reduce His cardiovascular risks. I answered all their questions today and the patient and his wife were in agreement. I would like to see him back after the sleep study is completed and encouraged him to call with any interim questions, concerns, problems or updates.   Thank you very much for allowing me to participate in the care of this nice patient. If I can be of any further assistance to you please do not hesitate to call me at [phone].  Sincerely,   Adrian FoleySaima Biagio Snelson, MD, PhD

## 2014-08-31 NOTE — Patient Instructions (Signed)

## 2014-10-01 ENCOUNTER — Other Ambulatory Visit: Payer: Self-pay | Admitting: Cardiovascular Disease

## 2014-11-25 ENCOUNTER — Other Ambulatory Visit: Payer: Self-pay | Admitting: Cardiovascular Disease

## 2014-11-25 LAB — HEPATIC FUNCTION PANEL
ALBUMIN: 4.2 g/dL (ref 3.5–5.2)
ALT: 12 U/L (ref 0–53)
AST: 17 U/L (ref 0–37)
Alkaline Phosphatase: 110 U/L (ref 39–117)
BILIRUBIN DIRECT: 0.1 mg/dL (ref 0.0–0.3)
BILIRUBIN INDIRECT: 0.7 mg/dL (ref 0.2–1.2)
Total Bilirubin: 0.8 mg/dL (ref 0.2–1.2)
Total Protein: 6.9 g/dL (ref 6.0–8.3)

## 2014-11-25 LAB — LIPID PANEL
Cholesterol: 189 mg/dL (ref 0–200)
HDL: 32 mg/dL — ABNORMAL LOW (ref >=40)
LDL CALC: 121 mg/dL — AB (ref 0–99)
Total CHOL/HDL Ratio: 5.9 Ratio
Triglycerides: 178 mg/dL — ABNORMAL HIGH (ref <150)
VLDL: 36 mg/dL (ref 0–40)

## 2014-12-01 ENCOUNTER — Ambulatory Visit (INDEPENDENT_AMBULATORY_CARE_PROVIDER_SITE_OTHER): Payer: Medicare Other | Admitting: Cardiovascular Disease

## 2014-12-01 ENCOUNTER — Encounter: Payer: Self-pay | Admitting: Cardiovascular Disease

## 2014-12-01 VITALS — BP 200/98 | HR 75 | Ht 68.0 in | Wt 219.0 lb

## 2014-12-01 DIAGNOSIS — I480 Paroxysmal atrial fibrillation: Secondary | ICD-10-CM

## 2014-12-01 DIAGNOSIS — E785 Hyperlipidemia, unspecified: Secondary | ICD-10-CM

## 2014-12-01 DIAGNOSIS — I10 Essential (primary) hypertension: Secondary | ICD-10-CM

## 2014-12-01 NOTE — Assessment & Plan Note (Signed)
History of paroxysmal atrial ablation in the past maintaining sinus rhythm. He was on a beta blocker one point which was discontinued because of long pauses. He is on Xarelto  oral anticoagulation.

## 2014-12-01 NOTE — Progress Notes (Signed)
12/01/2014 Adrian DrownWilliam R Melendez   02/02/1935  [id number]  Primary Physician Garlan FillersPATERSON,DANIEL G, MD Primary Cardiologist: Runell GessJonathan J. Lanore Renderos MD Roseanne RenoFACP,FACC,FAHA, FSCAI   HPI: The patient is a 80 year old, moderately overweight, married Caucasian male, father of 1, grandfather to 1 granddaughter who I just saw 1 year ago.  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 His risk factors include hypertension, hyperlipidemia intolerant to statin drugs. He had an episode of presyncope requiring hospitalization back in June. A 2D echo was normal, as was a head CT. A monitor showed PVCs. He was begun on a low dose beta blocker. Myoview performed in our office March 28, 2012, was nonischemic. However, event monitor did show PAF with fairly long pauses which would certainly explain his symptoms. I elected to put him on Xarelto and discontinued his beta blocker. Since that time, he has felt clinically improve He still has occasional episodes that last "a few seconds". He saw Corine ShelterLuke Kilroy PA-C in the office back in September. Since that time he has been completely asymptomatic.   Current Outpatient Prescriptions  Medication Sig Dispense Refill  . allopurinol (ZYLOPRIM) 300 MG tablet Take 450 mg by mouth daily.    . colchicine 0.6 MG tablet Take 0.6 mg by mouth as needed.     . potassium chloride SA (K-DUR,KLOR-CON) 20 MEQ tablet TAKE ONE TABLET BY MOUTH ONE TIME DAILY 30 tablet 11  . rivaroxaban (XARELTO) 20 MG TABS tablet Take 1 tablet (20 mg total) by mouth daily with supper. 30 tablet 5  . telmisartan-hydrochlorothiazide (MICARDIS HCT) 80-25 MG per tablet Take 1 tablet by mouth daily.     No current facility-administered medications for this visit.    Allergies  Allergen Reactions  . Statins     Cause leg weakness     History   Social History  . Marital Status: Married    Spouse Name: N/A  . Number of Children: 1  . Years of Education: [employer]   Occupational History  . 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 Smoking status: Never Smoker   . Smokeless tobacco: Never Used  . Alcohol Use: No  . Drug Use: No  . Sexual Activity: Yes   Other Topics Concern  . Not on file   Social History Narrative   Consumes no caffeine     Review of Systems: General: negative for chills, fever, night sweats or weight changes.  Cardiovascular: negative for chest pain, dyspnea on exertion, edema, orthopnea, palpitations, paroxysmal nocturnal dyspnea or shortness of breath Dermatological: negative for rash Respiratory: negative for cough or wheezing Urologic: negative for hematuria Abdominal: negative for nausea, vomiting, diarrhea, bright red blood per rectum, melena, or hematemesis Neurologic: negative for visual changes, syncope, or dizziness All other systems reviewed and are otherwise negative except as noted above.    Blood pressure 200/98, pulse 75, height 5\' 8"  (1.727 m), weight 219 lb (99.338 kg).  General appearance: alert and no distress Neck: no adenopathy, no carotid bruit, no JVD, supple, symmetrical, trachea midline and thyroid not enlarged, symmetric, no tenderness/mass/nodules Lungs: clear to auscultation bilaterally Heart: regular rate and rhythm, S1, S2 normal, no murmur, click, rub or gallop Extremities: extremities normal, atraumatic, no cyanosis or edema  EKG normal sinus rhythm at 75 with occasional PVCs. He also has septal Q waves. I personally reviewed this EKG  ASSESSMENT AND PLAN:   PAF (paroxysmal atrial fibrillation) by Holter June 2012 History of paroxysmal atrial ablation in the past  maintaining sinus rhythm. He was on a beta blocker one point which was discontinued because of long pauses. He is on Xarelto  oral anticoagulation.   HTN (hypertension) History of hypertension blood pressure measured sat 200/98. He is on Micardis 100 for thiazide. His blood pressure at home however is 135/80. Continue current medications   Dyslipidemia-  statin intol History of mild hyperlipidemia lab work performed 11/25/14 revealing a total cholesterol 189, LDL 121. He is statin intolerant. This point. I do not feel a need to rechallenge him given that he is 79 without history of ischemic heart disease.       Runell Gess MD FACP,FACC,FAHA, [facility] 12/01/2014 3:37 PM

## 2014-12-01 NOTE — Patient Instructions (Signed)
Your physician wants you to follow-up in: 1 year with Dr Berry. You will receive a reminder letter in the mail two months in advance. If you don't receive a letter, please call our office to schedule the follow-up appointment.  

## 2014-12-01 NOTE — Assessment & Plan Note (Signed)
History of hypertension blood pressure measured sat 200/98. He is on Micardis 100 for thiazide. His blood pressure at home however is 135/80. Continue current medications

## 2014-12-01 NOTE — Assessment & Plan Note (Signed)
History of mild hyperlipidemia lab work performed 11/25/14 revealing a total cholesterol 189, LDL 121. He is statin intolerant. This point. I do not feel a need to rechallenge him given that he is 79 without history of ischemic heart disease.

## 2014-12-03 ENCOUNTER — Telehealth: Payer: Self-pay | Admitting: *Deleted

## 2014-12-03 NOTE — Telephone Encounter (Signed)
Patient has an upcoming colonoscopy on 12/29/14 due to a history of colon polyps and Adrian Melendez is requesting permission to hold xarelto  I will defer to Dr Allyson SabalBerry.

## 2014-12-04 NOTE — Telephone Encounter (Signed)
Okay to stop his overall toe for his colonoscopy

## 2014-12-07 NOTE — Telephone Encounter (Signed)
verbage correction for Dr Hazle CocaBerry's note--xarelto. Okay to stop xarelto for colonoscopy.    Belenda CruiseKristin, please confirm how many days the xarelto can be help prior to the colonoscopy. Thanks!

## 2014-12-08 NOTE — Telephone Encounter (Signed)
I will route this message to Dr Randa EvensEdwards and request a copy of most recent BMP.

## 2014-12-08 NOTE — Telephone Encounter (Signed)
Don't have any record of recent BMET to assess kidney function.  Would recommend to hold x 48 hrs (2 doses).  If possible, can we get patient in for BMET to ensure CrCl WNL?

## 2014-12-09 ENCOUNTER — Telehealth: Payer: Self-pay | Admitting: Cardiovascular Disease

## 2014-12-09 NOTE — Telephone Encounter (Signed)
Please call.

## 2014-12-09 NOTE — Telephone Encounter (Signed)
i called and asked for [facility].  She was not able to answer the phone, but the woman that was on the phone commented that Adrian Folksmanda wanted to tell me that they did not have any recent labs on Adrian Melendez.  Noted.

## 2014-12-17 ENCOUNTER — Telehealth: Payer: Self-pay | Admitting: Cardiovascular Disease

## 2014-12-17 NOTE — Telephone Encounter (Signed)
   Have patient come in to see Kristen for BP evaluation and blood pressure medication changes

## 2014-12-17 NOTE — Telephone Encounter (Signed)
Pt called in stating that he was on Norvasc to help with his BP pressure but a side effect of the medicine was that it caused swelling in his legs. Dr. Allyson SabalBerry discontinued that medicine. Now this BP is now fluctuating and he would like to find another medication to help with his BP without causing edema. Please call back  Thank

## 2014-12-17 NOTE — Telephone Encounter (Signed)
Returned call to patient Dr.Berry advised to see Belenda CruiseKristin for B/P eval and B/P medication changes.Schedulers will call back with appointment with Belenda CruiseKristin.

## 2014-12-17 NOTE — Telephone Encounter (Signed)
Returned call to patient he stated his B/P has been elevated.Stated last time he saw Dr.Berry he stopped Norvasc because of legs swelling.Stated he had to restart because of elevated B/P.Stated legs started swelling again so he stopped.B/P now is 193/89 pulse 68.B/P has been ranging 163/81,144/88.Stated he would like a generic B/P medication for cost reasons.Advised [facility].

## 2014-12-29 ENCOUNTER — Other Ambulatory Visit: Payer: Self-pay | Admitting: Gastroenterology

## 2014-12-31 ENCOUNTER — Ambulatory Visit: Payer: Medicare Other | Admitting: Pharmacist Clinician (PhC)/ Clinical Pharmacy Specialist

## 2015-02-28 ENCOUNTER — Ambulatory Visit (INDEPENDENT_AMBULATORY_CARE_PROVIDER_SITE_OTHER): Payer: Medicare Other | Admitting: Neurology

## 2015-02-28 DIAGNOSIS — G4720 Circadian rhythm sleep disorder, unspecified type: Secondary | ICD-10-CM

## 2015-02-28 DIAGNOSIS — G4734 Idiopathic sleep related nonobstructive alveolar hypoventilation: Secondary | ICD-10-CM

## 2015-02-28 DIAGNOSIS — R9431 Abnormal electrocardiogram [ECG] [EKG]: Secondary | ICD-10-CM

## 2015-02-28 DIAGNOSIS — G4733 Obstructive sleep apnea (adult) (pediatric): Secondary | ICD-10-CM

## 2015-02-28 DIAGNOSIS — G479 Sleep disorder, unspecified: Secondary | ICD-10-CM

## 2015-02-28 DIAGNOSIS — R351 Nocturia: Secondary | ICD-10-CM

## 2015-02-28 NOTE — Sleep Study (Signed)
Please see the scanned sleep study interpretation located in the Procedure tab within the Chart Review section. 

## 2015-03-04 ENCOUNTER — Telehealth: Payer: Self-pay | Admitting: Neurology

## 2015-03-04 NOTE — Telephone Encounter (Signed)
Patient seen on 08/31/14, baseline sleep study on 02/28/15.  Please call and notify the patient that the recent sleep study did show evidence of obstructive sleep apnea. Unfortunately, he did not sleep well at all. Please inform patient that I would like to go over the details of the study during a follow up appointment and discuss treatment options. Pls arrange a followup appointment (please utilize a followu-up slot). Also, route or fax report to PCP and referring MD, if other than PCP.  Once you have spoken to patient, you can close this encounter.   Thanks,  Everlean Bucher, MD, PhD [facility]) 

## 2015-03-09 NOTE — Telephone Encounter (Signed)
Number is busy, I will try back later. I will fax this report to PCP.

## 2015-03-10 ENCOUNTER — Telehealth: Payer: Self-pay

## 2015-03-10 NOTE — Telephone Encounter (Signed)
Patient seen on 08/31/14, baseline sleep study on 02/28/15.  Please call and notify the patient that the recent sleep study did show evidence of obstructive sleep apnea. Unfortunately, he did not sleep well at all. Please inform patient that I would like to go over the details of the study during a follow up appointment and discuss treatment options. Pls arrange a followup appointment (please utilize a followu-up slot). Also, route or fax report to PCP and referring MD, if other than PCP.  Once you have spoken to patient, you can close this encounter.   Thanks,  [name], MD, PhD [facility])

## 2015-03-10 NOTE — Telephone Encounter (Signed)
Left message to call back for sleep study results.  

## 2015-03-10 NOTE — Telephone Encounter (Signed)
I spoke to patient. He is aware of results and was not able to come in until July 19. He will call us if needed sooner.

## 2015-04-05 ENCOUNTER — Other Ambulatory Visit: Payer: Self-pay | Admitting: Cardiovascular Disease

## 2015-04-06 ENCOUNTER — Ambulatory Visit (INDEPENDENT_AMBULATORY_CARE_PROVIDER_SITE_OTHER): Payer: Medicare Other | Admitting: Neurology

## 2015-04-06 ENCOUNTER — Encounter: Payer: Self-pay | Admitting: Neurology

## 2015-04-06 ENCOUNTER — Encounter: Payer: Self-pay | Admitting: Cardiovascular Disease

## 2015-04-06 VITALS — BP 158/72 | HR 72 | Resp 16 | Ht 69.0 in | Wt 220.0 lb

## 2015-04-06 DIAGNOSIS — G4734 Idiopathic sleep related nonobstructive alveolar hypoventilation: Secondary | ICD-10-CM

## 2015-04-06 DIAGNOSIS — G4733 Obstructive sleep apnea (adult) (pediatric): Secondary | ICD-10-CM | POA: Diagnosis not present

## 2015-04-06 DIAGNOSIS — I480 Paroxysmal atrial fibrillation: Secondary | ICD-10-CM | POA: Diagnosis not present

## 2015-04-06 NOTE — Progress Notes (Signed)
Subjective:    Patient ID: Adrian Melendez is a 79 y.o. male.  HPI     Interim history:   Adrian Melendez is a 80 year old right-handed gentleman with an underlying medical history of hyperlipidemia, history of TIA, paroxysmal A. fib on Xarelto, gout, ophthalmic migraines, hyperglycemia, and obesity, who presents for follow-up consultation of his sleep disorder, after his recent sleep study. The patient is accompanied by his wife and daughter today.  I first met him on 08/31/2014 at the request of his primary care physician at which time the patient reported snoring, nocturia, and nonrestorative sleep. I invited him back for sleep study. He had a baseline sleep study on 02/28/2015 and underwent over his test results with him in detail today. His sleep efficiency was very poor and the study was very limited because of poor sleep consolidation and 6 restroom breaks and a sleep efficiency of only 5.3%. His sleep latency was 249 minutes. Wake after sleep onset was 165.5 minutes. He had an elevated arousal index. He had no significant PLMS or EEG changes. He had intermittent PVCs on EKG. He had a total of 1 obstructive and 4 central apneas as well as 14 obstructive hypopneas. AHI was 49.6 per hour. Average oxygen saturation was 91%, nadir was 86%. He had absence of slow wave and REM sleep.   Today, 04/06/2015: He reports no significant changes in his symptoms. He still has to get up to use the bathroom quite a bit during the night. He feels that he did not have a good sleep study because he was nervous about it and he felt the technologist was not very reassuring and seemed irritated with him, especially since he had to get up all tubal times to use the bathroom. He is not sure if he goes in and out of A. fib. He sees his cardiologist once a year. He continues to have some right leg swelling. This improved when he was taken off of amlodipine but then his blood pressure increased so amlodipine was restarted  in the lower dose.  Previously:   He had an abnormal pulse oximetry test on 07/03/2014: average oxygen saturation was 89.2%, nadir was 79%. Time below 88% saturation was 161.3 minutes. Total recording time was 5 hours and 34 minutes. He snores very little. He has significant nocturia, at least 3 times per night, every 1 1/2 hours it appears.  He goes to bed around midnight. He does not sleep well and wake up well rested. Primarily he feels his sleep is disrupted by his bathroom breaks. He usually is up around 5 AM but still lays in bed and dozes off until 9 AM. He denies restless leg symptoms. He likes to sleep on his sides. He does not have any gasping sensations in his wife is not sure if he has any problems with his breathing in his sleep. He likes to take a nap in the late morning hours. His Epworth sleepiness score is 5 out of 24 today. He has at times very vivid dreams. For the past month he has noted right leg swelling. He has some on the left as   His Past Medical History Is Significant For: Past Medical History  Diagnosis Date  . Hypertension   . Gout   . Dizziness 02/29/2012  . Syncope, near 02/29/2012  . Hx-TIA (transient ischemic attack) June 2008  . Hx of migraines   . Gout   . Hyperlipidemia     statin intolerance  . History of  nuclear stress test 03/28/2012    lexiscan myoview; negative for ischemia  . PAF (paroxysmal atrial fibrillation)     xarelto  . Headache   . Migraines     classic ophthalmic  . Colon polyps     colonoscopy 2006  . Hyperlipidemia   . Hyperglycemia     His Past Surgical History Is Significant For: Past Surgical History  Procedure Laterality Date  . Eye surgery      laser surgery to seal vein   . Transthoracic echocardiogram  03/01/2012    EF 55-60% with normal systolic function; mild AV regurg; calcified mitral annulus - ordered for syncope  . Colonoscopy      2006,11/12    His Family History Is Significant For: Family History  Problem  Relation Age of Onset  . Cancer Maternal Grandmother   . Heart disease Maternal Grandfather   . Pneumonia Paternal Grandfather     His Social History Is Significant For: History   Social History  . Marital Status: Married    Spouse Name: N/A  . Number of Children: 1  . Years of Education: [employer]   Occupational History  . retired Corporate treasurer    Social History Main Topics  . Smoking status: Never Smoker   . Smokeless tobacco: Never Used  . Alcohol Use: No  . Drug Use: No  . Sexual Activity: Yes   Other Topics Concern  . None   Social History Narrative   Consumes no caffeine    His Allergies Are:  Allergies  Allergen Reactions  . Statins     Cause leg weakness   :   His Current Medications Are:  Outpatient Encounter Prescriptions as of 04/06/2015  Medication Sig  . allopurinol (ZYLOPRIM) 300 MG tablet Take 450 mg by mouth daily.  [name] amLODipine (NORVASC) 5 MG tablet TK 1 T PO QD FOR BP  . colchicine 0.6 MG tablet Take 0.6 mg by mouth as needed.   . potassium chloride SA (K-DUR,KLOR-CON) 20 MEQ tablet TAKE ONE TABLET BY MOUTH ONE TIME DAILY  . telmisartan-hydrochlorothiazide (MICARDIS HCT) 80-25 MG per tablet Take 1 tablet by mouth daily.  Alveda Reasons 20 MG TABS tablet TAKE 1 TABLET BY MOUTH DAILY WITH SUPPER   No facility-administered encounter medications on file as of 04/06/2015.  :  Review of Systems:  Out of a complete 14 point review of systems, all are reviewed and negative with the exception of these symptoms as listed below:   Review of Systems  All other systems reviewed and are negative.   Objective:  Neurologic Exam  Physical Exam Physical Examination:   Filed Vitals:   04/06/15 1515  BP: 158/72  Pulse: 72  Resp: 16   General Examination: The patient is a very pleasant 80 y.o. male in no acute distress. He appears well-developed and well-nourished and very well groomed. He is in good spirits today.  HEENT: Normocephalic, atraumatic, pupils  are equal, round and reactive to light and accommodation. Extraocular tracking is good without limitation to gaze excursion or nystagmus noted. Normal smooth pursuit is noted. Hearing is grossly intact. Face is symmetric with normal facial animation and normal facial sensation. Speech is clear with no dysarthria noted. There is no hypophonia. There is no lip, neck/head, jaw or voice tremor. Neck is supple with full range of passive and active motion. There are no carotid bruits on auscultation. Oropharynx exam reveals: moderate mouth dryness, adequate dental hygiene and moderate airway crowding, due to wider tongue  base, redundant soft palate and tonsils in place which I did not visualize fully. Mallampati is class III. Tongue protrudes centrally and palate elevates symmetrically.  Chest: Clear to auscultation without wheezing, rhonchi or crackles noted.  Heart: S1+S2+0, regular and normal without murmurs, rubs or gallops noted. He has the occasional extra beat. Generally speaking his pulse and heart rate are regular.  Abdomen: Soft, non-tender and non-distended with normal bowel sounds appreciated on auscultation.  Extremities: There is 1+ pitting edema in the right leg and trace edema on the left leg. He has no calf tenderness.  Skin: Warm and dry without trophic changes noted. There are no varicose veins.  Musculoskeletal: exam reveals no obvious joint deformities, tenderness or joint swelling or erythema.   Neurologically:  Mental status: The patient is awake, alert and oriented in all 4 spheres. His immediate and remote memory, attention, language skills and fund of knowledge are appropriate. There is no evidence of aphasia, agnosia, apraxia or anomia. Speech is clear with normal prosody and enunciation. Thought process is linear. Mood is normal and affect is normal.  Cranial nerves II - XII are as described above under HEENT exam. In addition: shoulder shrug is normal with equal shoulder height  noted. Motor exam: Normal bulk, strength and tone is noted. There is no drift, tremor or rebound. Romberg is negative. Reflexes are 2+ throughout. Fine motor skills and coordination: intact.  Cerebellar testing: No dysmetria or intention tremor on finger to nose testing.There is no truncal or gait ataxia.  Sensory exam: intact to light touch, pinprick, vibration, temperature sense in the upper and lower extremities.  Gait, station and balance: He stands easily. No veering to one side is noted. No leaning to one side is noted. Posture is age-appropriate and stance is narrow based. Gait shows normal stride length and normal pace. No problems turning are noted. He turns en bloc.                 Assessment and Plan:   In summary, Adrian Melendez is a very pleasant 80 year old male with an underlying medical history of hyperlipidemia, history of TIA, paroxysmal A. fib on Xarelto, gout, ophthalmic migraines, hyperglycemia, and obesity, who presents for follow-up consultation after his recent sleep study. His sleep study in June showed very limited sleep with poor sleep consolidation and multiple bathroom breaks. Nevertheless, the limited data available suggests obstructive sleep disordered breathing. I talked to the patient and his family about his symptoms of nocturia, nonrestorative sleep, daytime tiredness, and in light of his medical history of paroxysmal A. Fib, how obstructive sleep apnea comes into play. In addition, he had an abnormal pulse oximetry test in October 2015. At this juncture, I suggested that we try AutoPap at home. This will allow him to try out positive airway pressure treatment in the comfort of his own bedroom and ease himself into it gradually. He is advised to think about it and discuss it with his family as well. He is encouraged to call or email once he is ready to embark on a trial of AutoPap. I will see him back after that. I explained the sleep study findings to him and answered  all their questions. I had a long chat with the patient and his wife about my findings and the diagnosis of OSA, its prognosis and treatment options. We talked about medical treatments, surgical interventions and non-pharmacological approaches. I explained in particular the risks and ramifications of untreated moderate to severe OSA, especially  with respect to developing cardiovascular disease down the Road, including congestive heart failure, difficult to treat hypertension, cardiac arrhythmias, or stroke. Even type 2 diabetes has, in part, been linked to untreated OSA. Symptoms of untreated OSA include daytime sleepiness, memory problems, mood irritability and mood disorder such as depression and anxiety, lack of energy, as well as recurrent headaches, especially morning headaches. The patient and his family were in agreement.  I spent 15 minutes in total face-to-face time with the patient, more than 50% of which was spent in counseling and coordination of care, reviewing test results, reviewing medication and discussing or reviewing the diagnosis of OSA, its prognosis and treatment options.

## 2015-04-06 NOTE — Patient Instructions (Signed)
Please think about trying autoPAP at home for treatment of your sleep apnea.   I will see you back as needed. Please call or email, when you are ready to try it, as discussed.   Based on your limited sleep study results you have evidence of obstructive sleep apnea or OSA. Please remember, the risks and ramifications of moderate to severe obstructive sleep apnea or OSA are: Cardiovascular disease, including congestive heart failure, stroke, difficult to control hypertension, arrhythmias, and even type 2 diabetes has been linked to untreated OSA. Sleep apnea causes disruption of sleep and sleep deprivation in most cases, which, in turn, can cause recurrent headaches, problems with memory, mood, concentration, focus, and vigilance. Most people with untreated sleep apnea report excessive daytime sleepiness, which can affect their ability to drive. Please do not drive if you feel sleepy.

## 2015-05-03 ENCOUNTER — Other Ambulatory Visit: Payer: Self-pay | Admitting: Cardiovascular Disease

## 2015-06-02 ENCOUNTER — Telehealth: Payer: Self-pay | Admitting: Neurology

## 2015-06-02 DIAGNOSIS — G4733 Obstructive sleep apnea (adult) (pediatric): Secondary | ICD-10-CM

## 2015-06-02 NOTE — Telephone Encounter (Signed)
Patient called stating he is agreeable to try the Autopap at home. Please call and advise. Patient can be reached at [phone].

## 2015-06-02 NOTE — Telephone Encounter (Signed)
I spoke to patient. He has an account at [facility]. Orders will be sent there.

## 2015-06-02 NOTE — Telephone Encounter (Signed)
AutoPAP order place, please process, thx

## 2015-07-29 ENCOUNTER — Encounter: Payer: Self-pay | Admitting: Neurology

## 2015-07-29 ENCOUNTER — Telehealth: Payer: Self-pay

## 2015-07-29 ENCOUNTER — Ambulatory Visit (INDEPENDENT_AMBULATORY_CARE_PROVIDER_SITE_OTHER): Payer: Medicare Other | Admitting: Neurology

## 2015-07-29 VITALS — BP 172/84 | HR 78 | Resp 16 | Ht 69.0 in | Wt 222.0 lb

## 2015-07-29 DIAGNOSIS — G4733 Obstructive sleep apnea (adult) (pediatric): Secondary | ICD-10-CM

## 2015-07-29 DIAGNOSIS — Z9989 Dependence on other enabling machines and devices: Principal | ICD-10-CM

## 2015-07-29 NOTE — Progress Notes (Signed)
Subjective:    Patient ID: Adrian Melendez is a 80 y.o. male.  HPI     Interim history:  Adrian Melendez is a 80 year old right-handed gentleman with an underlying medical history of hyperlipidemia, history of TIA, paroxysmal A. fib on Xarelto, gout, ophthalmic migraines, hyperglycemia, and obesity, who presents for follow-up consultation of his obstructive sleep apnea, now on treatment with AutoPap. The patient is accompanied by his daughter today. I last saw him on 04/06/2015, at which time we talked about his recent sleep study results. He reported no significant changes in his symptoms. He had significant nocturia. He had some right leg swelling. He did not have a good experience with a sleep study. I suggested we try him on AutoPap at home. He wanted to think about it. I asked him to call us back when he was ready to embark on a trial of treatment at home. He called back on 06/02/2015, reported that he would like to try AutoPap therapy. We set him up through a DME company.  Today, 07/29/2015: I reviewed his AutoPap compliance data from 06/27/2015 through 07/26/2015 which is a total of 30 days during which time he used his machine every night with percent used days greater than 4 hours at 100%, indicating superb compliance with an average usage of 4 hours and 15 minutes, residual AHI low at 0.8 per hour, 95th percentile of pressure at 7.1, leak low with the 95th percentile at 5 L/m on a pressure setting of 5-15 cm with EPR.   Today, 07/29/2015: He reports doing fairly well. He uses a medium FFM. He is still adapting to it, perhaps a little better rested and less nocturia. No big problem going to sleep and staying asleep. He is willing to continue with it.   Previously:   I first met him on 08/31/2014 at the request of his primary care physician at which time the patient reported snoring, nocturia, and nonrestorative sleep. I invited him back for sleep study. He had a baseline sleep study on  02/28/2015 and underwent over his test results with him in detail today. His sleep efficiency was very poor and the study was very limited because of poor sleep consolidation and 6 restroom breaks and a sleep efficiency of only 5.3%. His sleep latency was 249 minutes. Wake after sleep onset was 165.5 minutes. He had an elevated arousal index. He had no significant PLMS or EEG changes. He had intermittent PVCs on EKG. He had a total of 1 obstructive and 4 central apneas as well as 14 obstructive hypopneas. AHI was 49.6 per hour. Average oxygen saturation was 91%, nadir was 86%. He had absence of slow wave and REM sleep.   He had an abnormal pulse oximetry test on 07/03/2014: average oxygen saturation was 89.2%, nadir was 79%. Time below 88% saturation was 161.3 minutes. Total recording time was 5 hours and 34 minutes. He snores very little. He has significant nocturia, at least 3 times per night, every 1 1/2 hours it appears.   He goes to bed around midnight. He does not sleep well and wake up well rested. Primarily he feels his sleep is disrupted by his bathroom breaks. He usually is up around 5 AM but still lays in bed and dozes off until 9 AM. He denies restless leg symptoms. He likes to sleep on his sides. He does not have any gasping sensations in his wife is not sure if he has any problems with his breathing in his sleep. He  likes to take a nap in the late morning hours. His Epworth sleepiness score is 5 out of 24 today. He has at times very vivid dreams. For the past month he has noted right leg swelling. He has some on the left as well.   His Past Medical History Is Significant For: Past Medical History  Diagnosis Date  . Hypertension   . Gout   . Dizziness 02/29/2012  . Syncope, near 02/29/2012  . Hx-TIA (transient ischemic attack) June 2008  . Hx of migraines   . Gout   . Hyperlipidemia     statin intolerance  . History of nuclear stress test 03/28/2012    lexiscan myoview; negative for  ischemia  . PAF (paroxysmal atrial fibrillation) ([place])     xarelto  . Headache   . Migraines     classic ophthalmic  . Colon polyps     colonoscopy 2006  . Hyperlipidemia   . Hyperglycemia     His Past Surgical History Is Significant For: Past Surgical History  Procedure Laterality Date  . Eye surgery      laser surgery to seal vein   . Transthoracic echocardiogram  03/01/2012    EF 55-60% with normal systolic function; mild AV regurg; calcified mitral annulus - ordered for syncope  . Colonoscopy      2006,11/12    His Family History Is Significant For: Family History  Problem Relation Age of Onset  . Cancer Maternal Grandmother   . Heart disease Maternal Grandfather   . Pneumonia Paternal Grandfather     His Social History Is Significant For: Social History   Social History  . Marital Status: Married    Spouse Name: N/A  . Number of Children: 1  . Years of Education: [employer]   Occupational History  . retired Corporate treasurer    Social History Main Topics  . Smoking status: Never Smoker   . Smokeless tobacco: Never Used  . Alcohol Use: No  . Drug Use: No  . Sexual Activity: Yes   Other Topics Concern  . None   Social History Narrative   Consumes no caffeine    His Allergies Are:  Allergies  Allergen Reactions  . Statins     Cause leg weakness   :   His Current Medications Are:  Outpatient Encounter Prescriptions as of 07/29/2015  Medication Sig  . allopurinol (ZYLOPRIM) 300 MG tablet Take 450 mg by mouth daily.  [name] amLODipine (NORVASC) 5 MG tablet TK 1 T PO QD FOR BP  . colchicine 0.6 MG tablet Take 0.6 mg by mouth as needed.   . potassium chloride SA (K-DUR,KLOR-CON) 20 MEQ tablet TAKE ONE TABLET BY MOUTH ONE TIME DAILY  . telmisartan-hydrochlorothiazide (MICARDIS HCT) 80-25 MG per tablet Take 1 tablet by mouth daily.  Alveda Reasons 20 MG TABS tablet TAKE 1 TABLET BY MOUTH DAILY WITH SUPPER.   No facility-administered encounter medications on file as  of 07/29/2015.  :  Review of Systems:  Out of a complete 14 point review of systems, all are reviewed and negative with the exception of these symptoms as listed below:   Review of Systems  Neurological:       Patient is here for AutoPap f/u. States he has trouble being able to sleep with the machine.     Objective:  Neurologic Exam  Physical Exam Physical Examination:   Filed Vitals:   07/29/15 1137  BP: 172/84  Pulse: 78  Resp: 16   General Examination:  The patient is a very pleasant 80 y.o. male in no acute distress. He appears well-developed and well-nourished and very well groomed. He is in good spirits today.  HEENT: Normocephalic, atraumatic, pupils are equal, round and reactive to light and accommodation. Extraocular tracking is good without limitation to gaze excursion or nystagmus noted. Normal smooth pursuit is noted. Hearing is grossly intact. Face is symmetric with normal facial animation and normal facial sensation. Speech is clear with no dysarthria noted. There is no hypophonia. There is no lip, neck/head, jaw or voice tremor. Neck is supple with full range of passive and active motion. There are no carotid bruits on auscultation. Oropharynx exam reveals: moderate mouth dryness, adequate dental hygiene and moderate airway crowding, due to wider tongue base, redundant soft palate and tonsils in place which I did not visualize fully. Mallampati is class III. Tongue protrudes centrally and palate elevates symmetrically.  Chest: Clear to auscultation without wheezing, rhonchi or crackles noted.  Heart: S1+S2+0, regular and normal without murmurs, rubs or gallops noted. He has the occasional extra beat. Generally speaking his pulse and heart rate are regular.  Abdomen: Soft, non-tender and non-distended with normal bowel sounds appreciated on auscultation.  Extremities: There is 1 to 2+ pitting edema in the right leg and trace to 1+ edema on the left leg. He has no calf  tenderness.  Skin: Warm and dry without trophic changes noted. There are no varicose veins.  Musculoskeletal: exam reveals no obvious joint deformities, tenderness or joint swelling or erythema, maybe slight R knee swelling, medially.   Neurologically:  Mental status: The patient is awake, alert and oriented in all 4 spheres. His immediate and remote memory, attention, language skills and fund of knowledge are appropriate. There is no evidence of aphasia, agnosia, apraxia or anomia. Speech is clear with normal prosody and enunciation. Thought process is linear. Mood is normal and affect is normal.  Cranial nerves II - XII are as described above under HEENT exam. In addition: shoulder shrug is normal with equal shoulder height noted. Motor exam: Normal bulk, strength and tone is noted. There is no drift, tremor or rebound. Romberg is negative. Reflexes are 1+ throughout. Fine motor skills and coordination: intact.  Cerebellar testing: No dysmetria or intention tremor on finger to nose testing.There is no truncal or gait ataxia.  Sensory exam: intact to light touch in the upper and lower extremities.  Gait, station and balance: He stands easily. No veering to one side is noted. No leaning to one side is noted. Posture is age-appropriate and stance is narrow based. Gait shows normal stride length and normal pace. No problems turning are noted. He turns en bloc.                 Assessment and Plan:   In summary, Adrian Melendez is a very pleasant 80 year old male with an underlying medical history of hyperlipidemia, history of TIA, paroxysmal A. fib on Xarelto, gout, ophthalmic migraines, hyperglycemia, and obesity, who presents for follow-up consultation of his obstructive sleep apnea. He had a sleep study in June of this year which showed very limited sleep and what ever little sleepy had showed evidence of sleep apnea. He was agreeable to trying AutoPap at the last visit and has been on it for a  little over a month. His compliance is superb. He is congratulated on this and commended for trying. While he does not have a day and night difference or great results thus far, he  does admit that his wife told him he looks better rested. He has more consolidated sleep and less nocturia. I encouraged him to continue with treatment. Today, I suggested we change him from AutoPap to a set pressure of 7. He's using a medium full facemask and maybe eventually able to try nose mask. We will wait it out some. His exam is stable. Perhaps his right foot and leg swelling is a little worse. It tends to swell more on the right side he reports. Swelling improved after he was taken off of Norvasc for a while but he is back on it. I talked to the patient and his daughter at length today. He is advised to continue with treatment.  Sometimes it takes a few months to get fully used to it and realizes the benefit from it. At this juncture, I suggested we do a 3 month checkup, and I placed an order for CPAP pressure of 7 cm. Down the road, we will think about trying to nose mask. I answered all their questions today and the patient and his daughter were in agreement.  I spent 20 minutes in total face-to-face time with the patient, more than 50% of which was spent in counseling and coordination of care, reviewing test results, reviewing medication and discussing or reviewing the diagnosis of OSA, its prognosis and treatment options.

## 2015-07-29 NOTE — Telephone Encounter (Signed)
Patient did not show for appt today.                         

## 2015-07-29 NOTE — Patient Instructions (Signed)
Please continue using your CPAP regularly. While your insurance requires that you use CPAP at least 4 hours each night on 70% of the nights, I recommend, that you not skip any nights and use it throughout the night if you can. Getting used to CPAP and staying with the treatment long term does take time and patience and discipline. Untreated obstructive sleep apnea when it is moderate to severe can have an adverse impact on cardiovascular health and raise her risk for heart disease, arrhythmias, hypertension, congestive heart failure, stroke and diabetes. Untreated obstructive sleep apnea causes sleep disruption, nonrestorative sleep, and sleep deprivation. This can have an impact on your day to day functioning and cause daytime sleepiness and impairment of cognitive function, memory loss, mood disturbance, and problems focussing. Using CPAP regularly can improve these symptoms.  We will change pressure to 7 cm and perhaps you can try a nose mask in the near future.

## 2015-07-29 NOTE — Telephone Encounter (Signed)
Patient was late for appt but we were able to work in to schedule.

## 2015-09-21 ENCOUNTER — Telehealth: Payer: Self-pay | Admitting: Neurology

## 2015-09-21 NOTE — Telephone Encounter (Signed)
I sent Angelica ChessmanMandy with [facility] a message about this patient asking her to check on this. I called the patient and let him know that I have contact [facility] about the pressure change.

## 2015-09-21 NOTE — Telephone Encounter (Signed)
Patient is calling and states that he understood that the pressure was to be changed to 7cm for his CPAP machine.  He had not noticed any change so he called [facility] [phone] and they stated they had not received an order for change.  Please call [facility] with order or contact patient to clarify.  Thanks!

## 2015-10-28 ENCOUNTER — Ambulatory Visit: Payer: Medicare Other | Admitting: Neurology

## 2015-10-29 ENCOUNTER — Telehealth: Payer: Self-pay | Admitting: Cardiovascular Disease

## 2015-10-29 MED ORDER — RIVAROXABAN 20 MG PO TABS: ORAL_TABLET | ORAL | Status: DC

## 2015-10-29 NOTE — Telephone Encounter (Signed)
Refill for 30 days of Xarelto 20 mgsent to [facility] on [address]

## 2015-10-29 NOTE — Telephone Encounter (Signed)
New message      *STAT* If patient is at the pharmacy, call can be transferred to refill team.   1. Which medications need to be refilled? (please list name of each medication and dose if known) xarelto  2. Which pharmacy/location (including street and city if local pharmacy) is medication to be sent to? [email or web address]   3. Do they need a 30 day or 90 day supply? 30 day

## 2015-11-25 ENCOUNTER — Encounter: Payer: Self-pay | Admitting: Neurology

## 2015-11-25 ENCOUNTER — Ambulatory Visit (INDEPENDENT_AMBULATORY_CARE_PROVIDER_SITE_OTHER): Payer: Medicare Other | Admitting: Neurology

## 2015-11-25 VITALS — BP 148/80 | HR 70 | Resp 18 | Ht 69.0 in | Wt 225.0 lb

## 2015-11-25 DIAGNOSIS — R351 Nocturia: Secondary | ICD-10-CM | POA: Diagnosis not present

## 2015-11-25 DIAGNOSIS — R600 Localized edema: Secondary | ICD-10-CM | POA: Diagnosis not present

## 2015-11-25 DIAGNOSIS — G4733 Obstructive sleep apnea (adult) (pediatric): Secondary | ICD-10-CM

## 2015-11-25 DIAGNOSIS — G479 Sleep disorder, unspecified: Secondary | ICD-10-CM

## 2015-11-25 NOTE — Progress Notes (Signed)
Subjective:    Patient ID: Adrian Melendez is a 81 y.o. male.  HPI     Interim history:   [name] is a 81 year old right-handed gentleman with an underlying medical history of hyperlipidemia, history of TIA, paroxysmal A. fib on Xarelto, gout, ophthalmic migraines, hyperglycemia, and obesity, who presents for follow-up consultation of his obstructive sleep apnea, on treatment with CPAP. The patient is accompanied by his daughter today. I last saw him on 07/29/2015, at which time he was fully compliant with his AutoPap treatment. He reported doing fairly well. He was using a fullface mask. We mutually agreed to switch him from AutoPap to set pressure of 7 cm. Also suggested he could try nasal mask when he would be up for a mask replacement next time.  Today, 11/25/2015: I reviewed his CPAP compliance data from 09/22/2015 through 11/23/2015 which is a total of 63 days during which time he used his CPAP machine only 11 days with percent used days greater than 4 hours it only 10%, indicating noncompliance. He has essentially stopped using his CPAP on 10/22/2015.  Today, 11/25/2015: He reports he has never actually been able to sleep very well with the CPAP or autoPAP machine. He was just using it while awake to fulfill compliance criteria. I guess I misunderstood last time that he was actually sleeping some with it and that he felt slightly improved with his sleep quality and nocturia. It turns out, that he actually meant to say that he was using it and in between he would sleep without it. He still has nocturia. He still has lower extremity swelling, right more than left. He has not seen a urologist. He has not been tried on a bladder medication. He says his PSA levels have been fine. He also reports difficulty with the mask and since he likes to sleep on his sides, the mask does not feel comfortable. He also had difficulty obtaining supplies but finally got new supplies recently.  Previously:    I saw him on 04/06/2015, at which time we talked about his recent sleep study results. He reported no significant changes in his symptoms. He had significant nocturia. He had some right leg swelling. He did not have a good experience with a sleep study. I suggested we try him on AutoPap at home. He wanted to think about it. I asked him to call us back when he was ready to embark on a trial of treatment at home. He called back on 06/02/2015, reported that he would like to try AutoPap therapy. We set him up through a DME company.  I reviewed his AutoPap compliance data from 06/27/2015 through 07/26/2015 which is a total of 30 days during which time he used his machine every night with percent used days greater than 4 hours at 100%, indicating superb compliance with an average usage of 4 hours and 15 minutes, residual AHI low at 0.8 per hour, 95th percentile of pressure at 7.1, leak low with the 95th percentile at 5 L/m on a pressure setting of 5-15 cm with EPR.   I first met him on 08/31/2014 at the request of his primary care physician at which time the patient reported snoring, nocturia, and nonrestorative sleep. I invited him back for sleep study. He had a baseline sleep study on 02/28/2015 and I went over his test results with him in detail today. His sleep efficiency was very poor and the study was very limited because of poor sleep consolidation and 6 restroom  breaks and a sleep efficiency of only 5.3%. His sleep latency was 249 minutes. Wake after sleep onset was 165.5 minutes. He had an elevated arousal index. He had no significant PLMS or EEG changes. He had intermittent PVCs on EKG. He had a total of 1 obstructive and 4 central apneas as well as 14 obstructive hypopneas. AHI was 49.6 per hour. Average oxygen saturation was 91%, nadir was 86%. He had absence of slow wave and REM sleep.   He had an abnormal pulse oximetry test on 07/03/2014: average oxygen saturation was 89.2%, nadir was 79%. Time  below 88% saturation was 161.3 minutes. Total recording time was 5 hours and 34 minutes. He snores very little. He has significant nocturia, at least 3 times per night, every 1 1/2 hours it appears.   He goes to bed around midnight. He does not sleep well and wake up well rested. Primarily he feels his sleep is disrupted by his bathroom breaks. He usually is up around 5 AM but still lays in bed and dozes off until 9 AM. He denies restless leg symptoms. He likes to sleep on his sides. He does not have any gasping sensations in his wife is not sure if he has any problems with his breathing in his sleep. He likes to take a nap in the late morning hours. His Epworth sleepiness score is 5 out of 24 today. He has at times very vivid dreams. For the past month he has noted right leg swelling. He has some on the left as well.    His Past Medical History Is Significant For: Past Medical History  Diagnosis Date  . Hypertension   . Gout   . Dizziness 02/29/2012  . Syncope, near 02/29/2012  . Hx-TIA (transient ischemic attack) June 2008  . Hx of migraines   . Gout   . Hyperlipidemia     statin intolerance  . History of nuclear stress test 03/28/2012    lexiscan myoview; negative for ischemia  . PAF (paroxysmal atrial fibrillation) ([place])     xarelto  . Headache   . Migraines     classic ophthalmic  . Colon polyps     colonoscopy 2006  . Hyperlipidemia   . Hyperglycemia     His Past Surgical History Is Significant For: Past Surgical History  Procedure Laterality Date  . Eye surgery      laser surgery to seal vein   . Transthoracic echocardiogram  03/01/2012    EF 55-60% with normal systolic function; mild AV regurg; calcified mitral annulus - ordered for syncope  . Colonoscopy      2006,11/12    His Family History Is Significant For: Family History  Problem Relation Age of Onset  . Cancer Maternal Grandmother   . Heart disease Maternal Grandfather   . Pneumonia Paternal Grandfather      His Social History Is Significant For: Social History   Social History  . Marital Status: Married    Spouse Name: N/A  . Number of Children: 1  . Years of Education: [employer]   Occupational History  . retired Corporate treasurer    Social History Main Topics  . Smoking status: Never Smoker   . Smokeless tobacco: Never Used  . Alcohol Use: No  . Drug Use: No  . Sexual Activity: Yes   Other Topics Concern  . None   Social History Narrative   Consumes no caffeine    His Allergies Are:  Allergies  Allergen Reactions  .  Statins     Cause leg weakness   :   His Current Medications Are:  Outpatient Encounter Prescriptions as of 11/25/2015  Medication Sig  . allopurinol (ZYLOPRIM) 300 MG tablet Take 450 mg by mouth daily.  [name] amLODipine (NORVASC) 5 MG tablet TK 1 T PO QD FOR BP  . colchicine 0.6 MG tablet Take 0.6 mg by mouth as needed.   . potassium chloride SA (K-DUR,KLOR-CON) 20 MEQ tablet TAKE ONE TABLET BY MOUTH ONE TIME DAILY  . rivaroxaban (XARELTO) 20 MG TABS tablet TAKE 1 TABLET BY MOUTH DAILY WITH SUPPER.  [name] telmisartan-hydrochlorothiazide (MICARDIS HCT) 80-25 MG per tablet Take 1 tablet by mouth daily.   No facility-administered encounter medications on file as of 11/25/2015.  :  Review of Systems:  Out of a complete 14 point review of systems, all are reviewed and negative with the exception of these symptoms as listed below:   Review of Systems  Neurological:       Patient reports that he cannot sleep with CPAP. No new concerns.     Objective:  Neurologic Exam  Physical Exam Physical Examination:   Filed Vitals:   11/25/15 1611  BP: 148/80  Pulse: 70  Resp: 18   General Examination: The patient is a very pleasant 81 y.o. male in no acute distress. He appears well-developed and well-nourished and well groomed.   HEENT: Normocephalic, atraumatic, pupils are equal, round and reactive to light and accommodation. Extraocular tracking is good without  limitation to gaze excursion or nystagmus noted. Normal smooth pursuit is noted. Hearing is grossly intact. Face is symmetric with normal facial animation and normal facial sensation. Speech is clear with no dysarthria noted. There is no hypophonia. There is no lip, neck/head, jaw or voice tremor. Neck is supple with full range of passive and active motion. There are no carotid bruits on auscultation. Oropharynx exam reveals: mild mouth dryness, adequate dental hygiene and moderate airway crowding, due to wider tongue base, redundant soft palate and tonsils in place. Mallampati is class III. Tongue protrudes centrally and palate elevates symmetrically.  Chest: Clear to auscultation without wheezing, rhonchi or crackles noted.  Heart: S1+S2+0, regular and normal without murmurs, rubs or gallops noted. He has the occasional extra beat. Generally speaking his pulse and heart rate are regular.  Abdomen: Soft, non-tender and non-distended with normal bowel sounds appreciated on auscultation.  Extremities: There is 1 to 2+ pitting edema in the right leg and trace to 1+ edema on the left leg. He has no calf tenderness, stable findings.  Skin: Warm and dry without trophic changes noted. There are no varicose veins.  Musculoskeletal: exam reveals no obvious joint deformities, tenderness or joint swelling or erythema, maybe slight R knee swelling, medially.   Neurologically:  Mental status: The patient is awake, alert and oriented in all 4 spheres. His immediate and remote memory, attention, language skills and fund of knowledge are appropriate. There is no evidence of aphasia, agnosia, apraxia or anomia. Speech is clear with normal prosody and enunciation. Thought process is linear. Mood is normal and affect is normal.  Cranial nerves II - XII are as described above under HEENT exam. In addition: shoulder shrug is normal with equal shoulder height noted. Motor exam: Normal bulk, strength and tone is noted.  There is no drift, tremor or rebound. Reflexes are 1+ throughout. Fine motor skills and coordination: intact.  Cerebellar testing: No dysmetria or intention tremor.  Sensory exam: intact to light touch in the  upper and lower extremities.  Gait, station and balance: He stands easily. No veering to one side is noted. No leaning to one side is noted. Posture is age-appropriate and stance is narrow based. Gait shows normal stride length and normal pace. No problems turning are noted. He turns en bloc.                 Assessment and Plan:   In summary, EARLY STEEL is a very pleasant 81 year old male with an underlying medical history of hyperlipidemia, history of TIA, paroxysmal A. fib on Xarelto, gout, ophthalmic migraines, hyperglycemia, and obesity, who presents for follow-up consultation of his obstructive sleep apnea. Hehad a baseline sleep study on 02/28/2015,which unfortunately showed very little sleep and some evidence of obstructive sleep apnea. Based on that we mutually agree to try him on AutoPap therapy and he was using it indicating mild improvements but today he states that he was actually not using it to sleep and mostly was awake while using it and did not actually feel better sleep better or had improvement in his nocturia which I documented last time. At any rate, we switched this to CPAP of 7 cm and he was using a fullface mask which he continues to use. I suggested he continue to try. He could try melatonin to help him sleep at night. He is encouraged to talk to his primary care physician about potentially trying a bladder medication for bladder hyperactivity or consider seeing a urologist. We talked about sleep apnea and potential alternative treatment options. While he can stand to lose some weight and is indeed encouraged to try to lose some weight, he is also advised that weighs loss takes time and in fact he has gained a little bit of weight. In addition, weight loss does not always  fix sleep apnea and may not help his sleep maintenance problems or nocturia. Surgical option is really not desirable and we mutually agreed that surgery to upper airway is not an option for him. At this juncture, he is encouraged to continue to try and I will see him back routinely in a few months, sooner if needed. I answered all their questions today and the patient and his daughter were in agreement.  I spent 25 minutes in total face-to-face time with the patient, more than 50% of which was spent in counseling and coordination of care, reviewing test results, reviewing medication and discussing or reviewing the diagnosis of OSA, its prognosis and treatment options.

## 2015-11-25 NOTE — Patient Instructions (Addendum)
Please try to use your CPAP regularly. While your insurance requires that you use CPAP at least 4 hours each night on 70% of the nights, I recommend, that you not skip any nights and use it throughout the night if you can. Getting used to CPAP and staying with the treatment long term does take time and patience and discipline. Untreated obstructive sleep apnea when it is moderate to severe can have an adverse impact on cardiovascular health and raise her risk for heart disease, arrhythmias, hypertension, congestive heart failure, stroke and diabetes. Untreated obstructive sleep apnea causes sleep disruption, nonrestorative sleep, and sleep deprivation. This can have an impact on your day to day functioning and cause daytime sleepiness and impairment of cognitive function, memory loss, mood disturbance, and problems focussing. Using CPAP regularly can improve these symptoms.  You can try Melatonin at night for sleep: take 3 to 5 mg one to two hours before your bedtime.   Maybe Dr. Eloise HarmanPaterson is comfortable trying you on a bladder medication?

## 2015-11-30 ENCOUNTER — Encounter: Payer: Self-pay | Admitting: *Deleted

## 2016-01-05 ENCOUNTER — Encounter (HOSPITAL_COMMUNITY): Payer: Self-pay | Admitting: Emergency Medicine

## 2016-01-05 ENCOUNTER — Telehealth: Payer: Self-pay | Admitting: Cardiovascular Disease

## 2016-01-05 DIAGNOSIS — M109 Gout, unspecified: Secondary | ICD-10-CM | POA: Diagnosis present

## 2016-01-05 DIAGNOSIS — Z79899 Other long term (current) drug therapy: Secondary | ICD-10-CM

## 2016-01-05 DIAGNOSIS — I129 Hypertensive chronic kidney disease with stage 1 through stage 4 chronic kidney disease, or unspecified chronic kidney disease: Secondary | ICD-10-CM | POA: Diagnosis present

## 2016-01-05 DIAGNOSIS — N183 Chronic kidney disease, stage 3 (moderate): Secondary | ICD-10-CM | POA: Diagnosis present

## 2016-01-05 DIAGNOSIS — I6322 Cerebral infarction due to unspecified occlusion or stenosis of basilar arteries: Principal | ICD-10-CM | POA: Diagnosis present

## 2016-01-05 DIAGNOSIS — R42 Dizziness and giddiness: Secondary | ICD-10-CM | POA: Diagnosis not present

## 2016-01-05 DIAGNOSIS — Z6830 Body mass index (BMI) 30.0-30.9, adult: Secondary | ICD-10-CM

## 2016-01-05 DIAGNOSIS — G43109 Migraine with aura, not intractable, without status migrainosus: Secondary | ICD-10-CM | POA: Diagnosis present

## 2016-01-05 DIAGNOSIS — I480 Paroxysmal atrial fibrillation: Secondary | ICD-10-CM | POA: Diagnosis present

## 2016-01-05 DIAGNOSIS — Z7901 Long term (current) use of anticoagulants: Secondary | ICD-10-CM

## 2016-01-05 DIAGNOSIS — E785 Hyperlipidemia, unspecified: Secondary | ICD-10-CM | POA: Diagnosis present

## 2016-01-05 DIAGNOSIS — E669 Obesity, unspecified: Secondary | ICD-10-CM | POA: Diagnosis present

## 2016-01-05 DIAGNOSIS — N179 Acute kidney failure, unspecified: Secondary | ICD-10-CM | POA: Diagnosis present

## 2016-01-05 DIAGNOSIS — Z8673 Personal history of transient ischemic attack (TIA), and cerebral infarction without residual deficits: Secondary | ICD-10-CM

## 2016-01-05 DIAGNOSIS — E876 Hypokalemia: Secondary | ICD-10-CM | POA: Diagnosis present

## 2016-01-05 LAB — COMPREHENSIVE METABOLIC PANEL
ALT: 17 U/L (ref 17–63)
ANION GAP: 14 (ref 5–15)
AST: 23 U/L (ref 15–41)
Albumin: 4.1 g/dL (ref 3.5–5.0)
Alkaline Phosphatase: 96 U/L (ref 38–126)
BUN: 18 mg/dL (ref 6–20)
CALCIUM: 9.3 mg/dL (ref 8.9–10.3)
CHLORIDE: 102 mmol/L (ref 101–111)
CO2: 24 mmol/L (ref 22–32)
CREATININE: 1.35 mg/dL — AB (ref 0.61–1.24)
GFR, EST AFRICAN AMERICAN: 56 mL/min — AB (ref >60)
GFR, EST NON AFRICAN AMERICAN: 49 mL/min — AB (ref >60)
Glucose, Bld: 133 mg/dL — ABNORMAL HIGH (ref 65–99)
Potassium: 3.4 mmol/L — ABNORMAL LOW (ref 3.5–5.1)
Sodium: 140 mmol/L (ref 135–145)
Total Bilirubin: 0.8 mg/dL (ref 0.3–1.2)
Total Protein: 7.4 g/dL (ref 6.5–8.1)

## 2016-01-05 LAB — CBC WITH DIFFERENTIAL/PLATELET
Basophils Absolute: 0.1 10*3/uL (ref 0.0–0.1)
Basophils Relative: 1 %
EOS PCT: 3 %
Eosinophils Absolute: 0.2 10*3/uL (ref 0.0–0.7)
HCT: 42.7 % (ref 39.0–52.0)
Hemoglobin: 14.6 g/dL (ref 13.0–17.0)
LYMPHS ABS: 2 10*3/uL (ref 0.7–4.0)
LYMPHS PCT: 26 %
MCH: 31.2 pg (ref 26.0–34.0)
MCHC: 34.2 g/dL (ref 30.0–36.0)
MCV: 91.2 fL (ref 78.0–100.0)
MONO ABS: 0.5 10*3/uL (ref 0.1–1.0)
MONOS PCT: 7 %
Neutro Abs: 5 10*3/uL (ref 1.7–7.7)
Neutrophils Relative %: 63 %
PLATELETS: 237 10*3/uL (ref 150–400)
RBC: 4.68 MIL/uL (ref 4.22–5.81)
RDW: 13.5 % (ref 11.5–15.5)
WBC: 7.8 10*3/uL (ref 4.0–10.5)

## 2016-01-05 NOTE — Telephone Encounter (Signed)
Pt calling c/o lightheadedness since Saturday , BP sitting 136-88-denies chest pain-SOB-pls advise [phone]

## 2016-01-05 NOTE — ED Notes (Signed)
Pt. reports " silent migraine " aura onset this evening , seeing lights with intermittent lightheadedness , alert and oriented , denies nausea or photophobia .

## 2016-01-05 NOTE — Telephone Encounter (Signed)
Returned call to pt. C/o feeling lightheaded at times. He says the spells only last for a "moment, just a couple seconds." It may happen when he is walking or sitting. He feels like his head is "filling up." He said he had been more congested and needing to blow his nose. Pt states his average BP has been 130/88.  He is taking his medications as listed in chart. Encouraged pt to see his primary care in the meantime. He has an appt with Dr Allyson SabalBerry on 01/28/16.   Says he has had "silent migraines for years." Where he sees bright lights and jagged lines for about 15 minutes. He said the frequency has increased. Encouraged him to contact his primary care to address that issue.  Will route to Dr Allyson SabalBerry for further recommendation if needed.

## 2016-01-06 ENCOUNTER — Encounter (HOSPITAL_COMMUNITY): Payer: Medicare Other

## 2016-01-06 ENCOUNTER — Inpatient Hospital Stay (HOSPITAL_COMMUNITY)
Admission: EM | Admit: 2016-01-06 | Discharge: 2016-01-07 | DRG: 065 | Disposition: A | Discharge status: Home / Self Care | Payer: Medicare Other | Attending: Internal Medicine | Admitting: Internal Medicine

## 2016-01-06 ENCOUNTER — Emergency Department (HOSPITAL_COMMUNITY): Payer: Medicare Other

## 2016-01-06 ENCOUNTER — Observation Stay (HOSPITAL_COMMUNITY): Payer: Medicare Other

## 2016-01-06 DIAGNOSIS — G43619 Persistent migraine aura with cerebral infarction, intractable, without status migrainosus: Secondary | ICD-10-CM

## 2016-01-06 DIAGNOSIS — I4891 Unspecified atrial fibrillation: Secondary | ICD-10-CM | POA: Diagnosis not present

## 2016-01-06 DIAGNOSIS — Z79899 Other long term (current) drug therapy: Secondary | ICD-10-CM | POA: Diagnosis not present

## 2016-01-06 DIAGNOSIS — Z8673 Personal history of transient ischemic attack (TIA), and cerebral infarction without residual deficits: Secondary | ICD-10-CM | POA: Diagnosis not present

## 2016-01-06 DIAGNOSIS — N179 Acute kidney failure, unspecified: Secondary | ICD-10-CM | POA: Diagnosis present

## 2016-01-06 DIAGNOSIS — E785 Hyperlipidemia, unspecified: Secondary | ICD-10-CM | POA: Diagnosis present

## 2016-01-06 DIAGNOSIS — I639 Cerebral infarction, unspecified: Secondary | ICD-10-CM

## 2016-01-06 DIAGNOSIS — Z7901 Long term (current) use of anticoagulants: Secondary | ICD-10-CM | POA: Diagnosis not present

## 2016-01-06 DIAGNOSIS — G43611 Persistent migraine aura with cerebral infarction, intractable, with status migrainosus: Secondary | ICD-10-CM | POA: Diagnosis not present

## 2016-01-06 DIAGNOSIS — G459 Transient cerebral ischemic attack, unspecified: Secondary | ICD-10-CM | POA: Diagnosis not present

## 2016-01-06 DIAGNOSIS — E669 Obesity, unspecified: Secondary | ICD-10-CM | POA: Diagnosis present

## 2016-01-06 DIAGNOSIS — N183 Chronic kidney disease, stage 3 (moderate): Secondary | ICD-10-CM | POA: Diagnosis present

## 2016-01-06 DIAGNOSIS — R42 Dizziness and giddiness: Secondary | ICD-10-CM

## 2016-01-06 DIAGNOSIS — I480 Paroxysmal atrial fibrillation: Secondary | ICD-10-CM

## 2016-01-06 DIAGNOSIS — I10 Essential (primary) hypertension: Secondary | ICD-10-CM | POA: Diagnosis not present

## 2016-01-06 DIAGNOSIS — M109 Gout, unspecified: Secondary | ICD-10-CM | POA: Diagnosis present

## 2016-01-06 DIAGNOSIS — I6322 Cerebral infarction due to unspecified occlusion or stenosis of basilar arteries: Secondary | ICD-10-CM | POA: Diagnosis present

## 2016-01-06 DIAGNOSIS — Z6830 Body mass index (BMI) 30.0-30.9, adult: Secondary | ICD-10-CM | POA: Diagnosis not present

## 2016-01-06 DIAGNOSIS — I63011 Cerebral infarction due to thrombosis of right vertebral artery: Secondary | ICD-10-CM

## 2016-01-06 DIAGNOSIS — G43109 Migraine with aura, not intractable, without status migrainosus: Secondary | ICD-10-CM | POA: Diagnosis present

## 2016-01-06 DIAGNOSIS — E876 Hypokalemia: Secondary | ICD-10-CM | POA: Diagnosis present

## 2016-01-06 DIAGNOSIS — H539 Unspecified visual disturbance: Secondary | ICD-10-CM

## 2016-01-06 DIAGNOSIS — I6340 Cerebral infarction due to embolism of unspecified cerebral artery: Secondary | ICD-10-CM

## 2016-01-06 DIAGNOSIS — I129 Hypertensive chronic kidney disease with stage 1 through stage 4 chronic kidney disease, or unspecified chronic kidney disease: Secondary | ICD-10-CM | POA: Diagnosis present

## 2016-01-06 HISTORY — DX: Migraine with aura, not intractable, without status migrainosus: G43.109

## 2016-01-06 LAB — LIPID PANEL
CHOL/HDL RATIO: 6.6 ratio
CHOLESTEROL: 223 mg/dL — AB (ref 0–200)
HDL: 34 mg/dL — ABNORMAL LOW (ref >40)
LDL Cholesterol: 158 mg/dL — ABNORMAL HIGH (ref 0–99)
Triglycerides: 157 mg/dL — ABNORMAL HIGH (ref <150)
VLDL: 31 mg/dL (ref 0–40)

## 2016-01-06 LAB — SEDIMENTATION RATE: Sed Rate: 19 mm/hr — ABNORMAL HIGH (ref 0–16)

## 2016-01-06 MED ORDER — ASPIRIN 81 MG PO CHEW
324.0000 mg | CHEWABLE_TABLET | Freq: Once | ORAL | Status: AC
  Administered 2016-01-06: 324 mg via ORAL
  Filled 2016-01-06: qty 4

## 2016-01-06 MED ORDER — STROKE: EARLY STAGES OF RECOVERY BOOK
Freq: Once | Status: AC
  Administered 2016-01-06: 1
  Filled 2016-01-06: qty 1

## 2016-01-06 MED ORDER — IRBESARTAN 300 MG PO TABS
300.0000 mg | ORAL_TABLET | Freq: Every day | ORAL | Status: DC
  Administered 2016-01-06 – 2016-01-07 (×2): 300 mg via ORAL
  Filled 2016-01-06 (×2): qty 1

## 2016-01-06 MED ORDER — SODIUM CHLORIDE 0.9% FLUSH
3.0000 mL | Freq: Two times a day (BID) | INTRAVENOUS | Status: DC
  Administered 2016-01-06 – 2016-01-07 (×3): 3 mL via INTRAVENOUS

## 2016-01-06 MED ORDER — DIPHENHYDRAMINE HCL 50 MG/ML IJ SOLN
12.5000 mg | Freq: Once | INTRAMUSCULAR | Status: AC
  Administered 2016-01-06: 12.5 mg via INTRAVENOUS
  Filled 2016-01-06: qty 1

## 2016-01-06 MED ORDER — POTASSIUM CHLORIDE CRYS ER 20 MEQ PO TBCR
20.0000 meq | EXTENDED_RELEASE_TABLET | Freq: Every day | ORAL | Status: DC
  Administered 2016-01-06 – 2016-01-07 (×2): 20 meq via ORAL
  Filled 2016-01-06 (×4): qty 1

## 2016-01-06 MED ORDER — ALLOPURINOL 100 MG PO TABS
450.0000 mg | ORAL_TABLET | Freq: Every day | ORAL | Status: DC
  Administered 2016-01-06 – 2016-01-07 (×2): 450 mg via ORAL
  Filled 2016-01-06 (×2): qty 5

## 2016-01-06 MED ORDER — DIVALPROEX SODIUM ER 500 MG PO TB24
500.0000 mg | ORAL_TABLET | Freq: Every day | ORAL | Status: DC
  Administered 2016-01-06 – 2016-01-07 (×2): 500 mg via ORAL
  Filled 2016-01-06 (×2): qty 1

## 2016-01-06 MED ORDER — HYDROCHLOROTHIAZIDE 25 MG PO TABS
25.0000 mg | ORAL_TABLET | Freq: Every day | ORAL | Status: DC
  Administered 2016-01-06 – 2016-01-07 (×2): 25 mg via ORAL
  Filled 2016-01-06 (×2): qty 1

## 2016-01-06 MED ORDER — AMLODIPINE BESYLATE 5 MG PO TABS
5.0000 mg | ORAL_TABLET | Freq: Every day | ORAL | Status: DC
  Administered 2016-01-06 – 2016-01-07 (×2): 5 mg via ORAL
  Filled 2016-01-06 (×2): qty 1

## 2016-01-06 MED ORDER — HYDRALAZINE HCL 20 MG/ML IJ SOLN
10.0000 mg | INTRAMUSCULAR | Status: DC | PRN
  Filled 2016-01-06: qty 1

## 2016-01-06 MED ORDER — SENNOSIDES-DOCUSATE SODIUM 8.6-50 MG PO TABS
1.0000 | ORAL_TABLET | Freq: Every evening | ORAL | Status: DC | PRN
  Administered 2016-01-06: 1 via ORAL
  Filled 2016-01-06: qty 1

## 2016-01-06 MED ORDER — CLOPIDOGREL BISULFATE 75 MG PO TABS: 75.0000 mg | ORAL_TABLET | Freq: Every day | ORAL | Status: DC

## 2016-01-06 MED ORDER — METOCLOPRAMIDE HCL 5 MG/ML IJ SOLN
10.0000 mg | Freq: Once | INTRAMUSCULAR | Status: AC
  Administered 2016-01-06: 10 mg via INTRAVENOUS
  Filled 2016-01-06: qty 2

## 2016-01-06 MED ORDER — ROSUVASTATIN CALCIUM 20 MG PO TABS
20.0000 mg | ORAL_TABLET | Freq: Every day | ORAL | Status: DC
  Administered 2016-01-06 – 2016-01-07 (×2): 20 mg via ORAL
  Filled 2016-01-06 (×2): qty 1

## 2016-01-06 MED ORDER — RIVAROXABAN 20 MG PO TABS
20.0000 mg | ORAL_TABLET | Freq: Every day | ORAL | Status: DC
  Administered 2016-01-06: 20 mg via ORAL
  Filled 2016-01-06: qty 1

## 2016-01-06 MED ORDER — TELMISARTAN-HCTZ 80-25 MG PO TABS: 1.0000 | ORAL_TABLET | Freq: Every day | ORAL | Status: DC

## 2016-01-06 MED ORDER — DEXTROSE 5 % IV SOLN
1000.0000 mg | Freq: Once | INTRAVENOUS | Status: DC
  Filled 2016-01-06: qty 10

## 2016-01-06 MED ORDER — POTASSIUM CHLORIDE CRYS ER 20 MEQ PO TBCR
20.0000 meq | EXTENDED_RELEASE_TABLET | Freq: Once | ORAL | Status: AC
  Administered 2016-01-06: 20 meq via ORAL
  Filled 2016-01-06: qty 1

## 2016-01-06 MED ORDER — AMITRIPTYLINE HCL 25 MG PO TABS
25.0000 mg | ORAL_TABLET | Freq: Every day | ORAL | Status: DC
Start: 2016-01-06 — End: 2016-01-06
  Administered 2016-01-06: 25 mg via ORAL
  Filled 2016-01-06: qty 1

## 2016-01-06 MED ORDER — ASPIRIN 81 MG PO CHEW
81.0000 mg | CHEWABLE_TABLET | Freq: Every day | ORAL | Status: DC
  Administered 2016-01-06 – 2016-01-07 (×2): 81 mg via ORAL
  Filled 2016-01-06 (×2): qty 1

## 2016-01-06 NOTE — ED Notes (Signed)
EDP at bedside  

## 2016-01-06 NOTE — ED Notes (Signed)
Called main lab to add on sed rate

## 2016-01-06 NOTE — ED Notes (Signed)
Called MRI, stated unable to get pt before taking upstairs. Another pt on the table. Will inform floor when calling report

## 2016-01-06 NOTE — Progress Notes (Signed)
VASCULAR LAB PRELIMINARY  PRELIMINARY  PRELIMINARY  PRELIMINARY  Carotid duplex completed.    Preliminary report:  1-39% ICA plaquing.  Vertebral artery flow is antegrade.   Emiya Loomer, RVT 01/06/2016, 9:10 AM

## 2016-01-06 NOTE — Progress Notes (Signed)
STROKE TEAM PROGRESS NOTE   HISTORY OF PRESENT ILLNESS Adrian DrownWilliam R Melendez is a 81 y.o. male with complex medical history including afib on xarelto - he is compliant and "retinal, acephalgic migraines" characterized by squigles and colorful phosphenes. They can last up to 2 minutes and typically come without headache. He denies any other neurological symptoms. Typically after these spells he is tired. They last 15 minutes. He has had these for the last several years, and has a frequency of approx 6 per year. But since Saturday he has been getting 6 per day or so and the pattern is increasing. Because of this he came to the ED and an MRI showed tiny cortically based strokes. Patient was not administered IV t-PA.   SUBJECTIVE (INTERVAL HISTORY) His daughter Adrian Melendez is at the bedside.  Overall he feels his condition is stable.I obtained detailed history from him about his longstanding migraines. He says his visual disturbances are still there, just less pronounced. Present in both eyes. Jagged lines with bright colors than progress to big shapes moving all around. No accompanied HA. typically last 15 mins, may or may not recur. This time, they have not resolved.    OBJECTIVE Temp:  [97.5 F (36.4 C)-98.7 F (37.1 C)] 97.8 F (36.6 C) (04/20 1145) Pulse Rate:  [56-74] 66 (04/20 1145) Cardiac Rhythm:  [-] Atrial fibrillation (04/20 0544) Resp:  [13-20] 17 (04/20 1145) BP: (140-195)/(77-91) 140/77 mmHg (04/20 1145) SpO2:  [91 %-99 %] 97 % (04/20 1145) Weight:  [99.338 kg (219 lb)-99.791 kg (220 lb)] 99.338 kg (219 lb) (04/20 0536)  CBC:   Recent Labs Lab 01/05/16 2250  WBC 7.8  NEUTROABS 5.0  HGB 14.6  HCT 42.7  MCV 91.2  PLT 237    Basic Metabolic Panel:   Recent Labs Lab 01/05/16 2250  NA 140  K 3.4*  CL 102  CO2 24  GLUCOSE 133*  BUN 18  CREATININE 1.35*  CALCIUM 9.3    Lipid Panel:     Component Value Date/Time   CHOL 223* 01/05/2016 0451   TRIG 157* 01/05/2016  0451   HDL 34* 01/05/2016 0451   CHOLHDL 6.6 01/05/2016 0451   VLDL 31 01/05/2016 0451   LDLCALC 158* 01/05/2016 0451   HgbA1c:  Lab Results  Component Value Date   HGBA1C 5.9* 03/01/2012   Urine Drug Screen: No results found for: LABOPIA, COCAINSCRNUR, LABBENZ, AMPHETMU, THCU, LABBARB    IMAGING  Adrian Maxine GlennMra Head Wo Contrast 01/06/2016  1. The Right PCA is within normal limits. 2. Severe proximal Basilar artery stenosis, and severe Left PCA origin stenosis have developed since 2008. 3. 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 Right PICA remains patent. The left vertebral artery is chronically dominant. 4. The anterior circulation is more stable since 2008, with only mild bilateral MCA origin stenoses.   Adrian Brain Wo Contrast 01/06/2016   At least 3 subcentimeter foci of probable acute ischemia RIGHT occipital lobe. Focal loss of RIGHT vertebral artery V4 flow void concerning for focal occlusion. Moderate to severe chronic small vessel ischemic disease. Old small RIGHT cerebellar and LEFT pontine infarcts. Old bilateral basal ganglia lacunar infarcts.   Carotid Doppler   There is 1-39% bilateral ICA stenosis. Vertebral artery flow is antegrade.    PHYSICAL EXAM Pleasant elderly Caucasian male not in distress. . Afebrile. Head is nontraumatic. Neck is supple without bruit.    Cardiac exam no murmur or gallop. Lungs are clear  to auscultation. Distal pulses are well felt. Neurological Exam ;  Awake  Alert oriented x 3. Normal speech and language.eye movements full without nystagmus.fundi were not visualized. Vision acuity  appears normal. Partial left hemianopia on bedside testing.Hearing is normal. Palatal movements are normal. Face symmetric. Tongue midline. Normal strength, tone, reflexes and coordination. Normal sensation. Gait deferred. ASSESSMENT/PLAN Adrian. SAMMUEL Melendez is a 81 y.o. male with history of atrial fibrillation on xarelto and  retinal migraines presenting with visual auras. He did not receive IV t-PA.   Stroke:  Multiple small  right occipital infarcts embolic secondary to known atrial fibrillation vs Migrainous infarction or symptomatic basilar artery stenosis  Resultant  Visual field abnormalities  MRI  Punctate acute infarcts R occipital lobe. Old R cerebellar, L pontine, B basal ganglia lacunes  MRA  Severe prox BA stenosis, severe L PCA origin stenosis. R VA occlusion. Anterior circulation stable.   Carotid Doppler  No significant stenosis   2D Echo  pending   LDL 158  HgbA1c pending  xarelto for VTE prophylaxis Diet Heart Room service appropriate?: Yes; Fluid consistency:: Thin  Xarelto (rivaroxaban) daily prior to admission, now on Xarelto (rivaroxaban) daily. Add aspirin low dose given large vessel stenosis  Patient counseled to be compliant with his antithrombotic medications  Ongoing aggressive stroke risk factor management  Therapy recommendations:  pending   Disposition:  pending   Basilar Artery stenosis  Will add low dose aspirin to xarelto given large vessel stenosis  No intervention at this time  Follow   Paroxysmal Atrial Fibrillation  Home anticoagulation:  Xarelto (rivaroxaban) daily   Continue xarelto at discharge   Hypertension  Stable, 150-180s Permissive hypertension (OK if < 220/120) but gradually normalize in 5-7 days  Hyperlipidemia  Home meds:  No statin  LDL 158, goal < 70  Intolerant to multiple statins in the past  Consider trying a different statin vs OP injections  Other Stroke Risk Factors  Advanced age  Obesity, Body mass index is 30.56 kg/(m^2).   Hx stroke/TIA  Migraines. Elavil added yesterday by neuro for prophylaxis. Received depacon this am. Will d/c elavil and add depakote 500 daily.  Other Active Problems  Gout   Hypokalemia, replaced  Hospital day # 0  Adrian Melendez [name] See Amion for Pager  information 01/06/2016 1:07 PM  I have personally examined this patient, reviewed notes, independently viewed imaging studies, participated in medical decision making and plan of care. I have made any additions or clarifications directly to the above note. Agree with note above. He presented with prolonged visual migraine symptoms but has right occipital infarcts-etiology unclear migrainous infarct versus embolic from afib or basilar stenosis. He is at very high risk for recurrent stroke, TIA, neurological worsening and presents a treatment challenge with medical decision making of high complexity as he has failed anticoagulation. Recommend add aspirin 81 mg for basilar stenosis. Start Depakote for visual migraines. I reviewed risk benefit of intracranial stenting with patient and daughter and stated current evidence does favor aggressive medical therapy first over stenting. Greater than 50% time during this 35 minute visit was spent on coordination of care and counselling on stroke prevention treatment strategies and discussion with daughter at bedside. Delia Heady, MD Medical Director [facility] Pager: [phone] 01/06/2016 6:44 PM    To contact Stroke Continuity provider, please refer to [email or web address]. After hours, contact General Neurology

## 2016-01-06 NOTE — Progress Notes (Signed)
Patient arrived around 0530 alert and oriented no pain with wife and daughter, able to ambulate to bed from stretcher and to bathroom with standby assistance.

## 2016-01-06 NOTE — ED Provider Notes (Signed)
CSN: [id number]     Arrival date & time 01/05/16  2205 History  By signing my name below, I, Bethel Born, attest that this documentation has been prepared under the direction and in the presence of Dione Booze, MD. Electronically Signed: Bethel Born, ED Scribe. 01/06/2016. 1:57 AM   Chief Complaint  Patient presents with  . Migraine  . Dizziness   The history is provided by the patient. No language interpreter was used.   Adrian Melendez is a 81 y.o. male with history of migraines, HTN, and TIA who presents to the Emergency Department complaining of a worsening, intermittent, visual disturbance, seeing a thin line of color, with onset 6 days ago. Pt states that he has history of "silent migraines" (migraine aura with no associated headache). He reports that typically the disturbance is mild and fades quickly. Recently he has been having more than 6 episodes per day and the symptoms are more severe. Associated symptoms include intermittent fullness in the head, nasal congestion, and tinnitus which is new. Pt denies vision loss and nausea. He sees Dr. Jarold Motto for primary care.   Past Medical History  Diagnosis Date  . Hypertension   . Gout   . Dizziness 02/29/2012  . Syncope, near 02/29/2012  . Hx-TIA (transient ischemic attack) June 2008  . Hx of migraines   . Gout   . Hyperlipidemia     statin intolerance  . History of nuclear stress test 03/28/2012    lexiscan myoview; negative for ischemia  . PAF (paroxysmal atrial fibrillation) (HCC)     xarelto  . Headache   . Migraines     classic ophthalmic  . Colon polyps     colonoscopy 2006  . Hyperlipidemia   . Hyperglycemia   . Migraine aura without headache    Past Surgical History  Procedure Laterality Date  . Eye surgery      laser surgery to seal vein   . Transthoracic echocardiogram  03/01/2012    EF 55-60% with normal systolic function; mild AV regurg; calcified mitral annulus - ordered for syncope  . Colonoscopy       2006,11/12   Family History  Problem Relation Age of Onset  . Cancer Maternal Grandmother   . Heart disease Maternal Grandfather   . Pneumonia Paternal Grandfather    Social History  Substance Use Topics  . Smoking status: Never Smoker   . Smokeless tobacco: Never Used  . Alcohol Use: No    Review of Systems  HENT: Positive for congestion and tinnitus.   Eyes: Positive for visual disturbance.  Neurological: Positive for headaches.  All other systems reviewed and are negative.  Allergies  Statins  Home Medications   Prior to Admission medications   Medication Sig Start Date End Date Taking? Authorizing Provider  allopurinol (ZYLOPRIM) 300 MG tablet Take 450 mg by mouth daily.    Historical Provider, MD  amLODipine (NORVASC) 5 MG tablet TK 1 T PO QD FOR BP 03/17/15   Historical Provider, MD  colchicine 0.6 MG tablet Take 0.6 mg by mouth as needed.     Historical Provider, MD  potassium chloride SA (K-DUR,KLOR-CON) 20 MEQ tablet TAKE ONE TABLET BY MOUTH ONE TIME DAILY 06/05/13   Runell Gess, MD  rivaroxaban (XARELTO) 20 MG TABS tablet TAKE 1 TABLET BY MOUTH DAILY WITH SUPPER. 10/29/15   Runell Gess, MD  telmisartan-hydrochlorothiazide (MICARDIS HCT) 80-25 MG per tablet Take 1 tablet by mouth daily.    Historical Provider, MD  BP 172/79 mmHg  Pulse 61  Temp(Src) 98.2 F (36.8 C) (Oral)  Resp 18  Ht  (1.803 m)  Wt 220 lb (99.791 kg)  BMI 30.70 kg/m2  SpO2 97% Physical Exam  Constitutional: He is oriented to person, place, and time. He appears well-developed and well-nourished.  HENT:  Head: Normocephalic and atraumatic.  Mild tenderness over temporal arteries bilaterally   Eyes: EOM are normal.  Fundi are normal   Neck: Normal range of motion.  No carotid bruit   Cardiovascular: Normal rate, regular rhythm, normal heart sounds and intact distal pulses.   Pulmonary/Chest: Effort normal and breath sounds normal. No respiratory distress.  Abdominal:  Soft. He exhibits no distension. There is no tenderness.  Musculoskeletal: Normal range of motion.  Neurological: He is alert and oriented to person, place, and time.  Motor, strength 5/5 in all muscle groups No pronator drift   Skin: Skin is warm and dry.  Psychiatric: He has a normal mood and affect. Judgment normal.  Nursing note and vitals reviewed.   ED Course  Procedures (including critical care time) DIAGNOSTIC STUDIES: Oxygen Saturation is 97% on RA,  normal by my interpretation.    COORDINATION OF CARE: 1:49 AM Discussed treatment plan which includes lab work and MRI with pt at bedside and pt agreed to plan.  Labs Review Results for orders placed or performed during the hospital encounter of 01/06/16  CBC with Differential  Result Value Ref Range   WBC 7.8 4.0 - 10.5 K/uL   RBC 4.68 4.22 - 5.81 MIL/uL   Hemoglobin 14.6 13.0 - 17.0 g/dL   HCT 09.8 11.9 - 14.7 %   MCV 91.2 78.0 - 100.0 fL   MCH 31.2 26.0 - 34.0 pg   MCHC 34.2 30.0 - 36.0 g/dL   RDW 82.9 56.2 - 13.0 %   Platelets 237 150 - 400 K/uL   Neutrophils Relative % 63 %   Neutro Abs 5.0 1.7 - 7.7 K/uL   Lymphocytes Relative 26 %   Lymphs Abs 2.0 0.7 - 4.0 K/uL   Monocytes Relative 7 %   Monocytes Absolute 0.5 0.1 - 1.0 K/uL   Eosinophils Relative 3 %   Eosinophils Absolute 0.2 0.0 - 0.7 K/uL   Basophils Relative 1 %   Basophils Absolute 0.1 0.0 - 0.1 K/uL  Comprehensive metabolic panel  Result Value Ref Range   Sodium 140 135 - 145 mmol/L   Potassium 3.4 (L) 3.5 - 5.1 mmol/L   Chloride 102 101 - 111 mmol/L   CO2 24 22 - 32 mmol/L   Glucose, Bld 133 (H) 65 - 99 mg/dL   BUN 18 6 - 20 mg/dL   Creatinine, Ser 8.65 (H) 0.61 - 1.24 mg/dL   Calcium 9.3 8.9 - 78.4 mg/dL   Total Protein 7.4 6.5 - 8.1 g/dL   Albumin 4.1 3.5 - 5.0 g/dL   AST 23 15 - 41 U/L   ALT 17 17 - 63 U/L   Alkaline Phosphatase 96 38 - 126 U/L   Total Bilirubin 0.8 0.3 - 1.2 mg/dL   GFR calc non Af Amer 49 (L) >60 mL/min   GFR calc Af  Amer 56 (L) >60 mL/min   Anion gap 14 5 - 15  Sedimentation rate  Result Value Ref Range   Sed Rate 19 (H) 0 - 16 mm/hr   Imaging Review [name]  01/06/2016  CLINICAL DATA:  Worsening intermittent visual disturbances for 6 days. Intermittent head  fullness, nasal congestion and tinnitus. History of migraines, hypertension, hyperlipidemia. EXAM: MRI HEAD WITHOUT CONTRAST TECHNIQUE: Multiplanar, multiecho pulse sequences of the brain and surrounding structures were obtained without intravenous contrast. COMPARISON:  CT head February 29, 2012 and MRI of the head March 04, 2007 FINDINGS: INTRACRANIAL CONTENTS: At least 3 foci of reduced diffusion RIGHT occipital lobe measure up to 4 mm, difficult to localize on ADC map due to small size. Small foci of susceptibility artifact RIGHT insula, LEFT mesial thalamus. The ventricles and sulci are normal for patient's age. Patchy to confluent supratentorial white matter FLAIR T2 hyperintensities. Old small RIGHT cerebellar, LEFT pontine infarcts. Old bilateral basal ganglia lacunar infarcts. No suspicious parenchymal signal, mass lesions, mass effect. No abnormal extra-axial fluid collections. No extra-axial masses though, contrast enhanced sequences would be more sensitive. Focal loss of RIGHT V4 flow void. dolicoectatic intracranial vessels. ORBITS: The included ocular globes and orbital contents are non-suspicious. SINUSES: Small RIGHT maxillary mucosal retention cysts, small LEFT frontal sinus mucosal retention cyst. The mastoid air-cells and included paranasal sinuses are otherwise well-aerated. SKULL/SOFT TISSUES: No abnormal sellar expansion. No suspicious calvarial bone marrow signal. Craniocervical junction maintained. IMPRESSION: At least 3 subcentimeter foci of probable acute ischemia RIGHT occipital lobe. Focal loss of RIGHT vertebral artery V4 flow void concerning for focal occlusion. Moderate to severe chronic small vessel ischemic disease. Old small  RIGHT cerebellar and LEFT pontine infarcts. Old bilateral basal ganglia lacunar infarcts. Electronically Signed   By: Awilda Metroourtnay  Bloomer M.D.   On: 01/06/2016 03:53   I have personally reviewed and evaluated these images and lab results as part of my medical decision-making.   EKG Interpretation   Date/Time:  Thursday January 06 2016 04:29:42 EDT Ventricular Rate:  63 PR Interval:  152 QRS Duration: 107 QT Interval:  440 QTC Calculation: 450 R Axis:   -12 Text Interpretation:  Sinus rhythm Atrial premature complex Anteroseptal  infarct, age indeterminate Nonspecific T wave abnormality When compared  with ECG of 03/01/2002, T wave inversion in the Anterolateral leads is no  longer Present Confirmed by [facility]GL[facility]  MD, Dorna Mallet ([id number]) on 01/06/2016 4:43:12  AM      MDM   Final diagnoses:  Cerebrovascular accident (CVA) due to embolism of cerebral artery (HCC)  Paroxysmal atrial fibrillation (HCC)  Anticoagulant long-term use    Visual changes in pattern similar to what he has had with ophthalmic migraines in the past. Old records are reviewed confirming diagnosis of ophthalmic migraine. However, with worsening symptoms, I feel he does need to be evaluated for possible stroke and all possible temporal arteritis. He sedimentation rate is obtained which is come back essentially normal at 19. He is sent for MRI of the brain which shows at least 3 subcentimeter foci of probable acute ischemia in the right occipital lobe. There is a suggestion of possible occlusion of the right vertebral artery. However, separate areas of ischemia is worrisome for possible embolism. He does have history of atrial fibrillation but is anticoagulated on rivaroxaban. Case is discussed with Dr. Cena BentonVega of [facility] who agrees to see the patient consultation, and case is discussed with Dr. Lendell CapriceSullivan of [facility] who agrees to admit the patient.  I personally performed the services described in this  documentation, which was scribed in my presence. The recorded information has been reviewed and is accurate.      Dione Boozeavid Clema Skousen, MD 01/06/16 [phone]

## 2016-01-06 NOTE — Consult Note (Signed)
Neurology Consultation Reason for Consult: strokes on MRI Referring Physician: ED attending  CC: visual auras  History is obtained from patient  HPI: Adrian Melendez is a 81 y.o. male with complex medical history including afib on xarelto - he is compliant and "retinal, acephalgic migraines" characterized by squigles and colorful phosphenes.  They can last up to 2 minutes and typically come without headache.  He denies any other neurological symptoms.  Typically after these spells he is tired.  They last 15 minutes.  He has had these for the last several years, and has a frequency of approx 6 per year.  But since Saturday he has been getting 6 per day or so and the pattern is increasing.  Because of this he came to the ED and an MRI showed tiny cortically based strokes.  Full ROS otherwise completed and found to be negative.   ROS: A 14 point ROS was performed and is negative except as noted in the HPI.  Past Medical History  Diagnosis Date  . Hypertension   . Gout   . Dizziness 02/29/2012  . Syncope, near 02/29/2012  . Hx-TIA (transient ischemic attack) June 2008  . Hx of migraines   . Gout   . Hyperlipidemia     statin intolerance  . History of nuclear stress test 03/28/2012    lexiscan myoview; negative for ischemia  . PAF (paroxysmal atrial fibrillation) (HCC)     xarelto  . Headache   . Migraines     classic ophthalmic  . Colon polyps     colonoscopy 2006  . Hyperlipidemia   . Hyperglycemia   . Migraine aura without headache     Family History  Problem Relation Age of Onset  . Cancer Maternal Grandmother   . Heart disease Maternal Grandfather   . Pneumonia Paternal Grandfather    Social History:  reports that he has never smoked. He has never used smokeless tobacco. He reports that he does not drink alcohol or use illicit drugs.   Exam: Current vital signs: BP 187/85 mmHg  Pulse 61  Temp(Src) 97.9 F (36.6 C) (Oral)  Resp 13  Ht  (1.803 m)  Wt 99.791  kg (220 lb)  BMI 30.70 kg/m2  SpO2 91% Vital signs in last 24 hours: Temp:  [97.5 F (36.4 C)-98.2 F (36.8 C)] 97.9 F (36.6 C) (04/20 0430) Pulse Rate:  [56-74] 61 (04/20 0430) Resp:  [13-20] 13 (04/20 0430) BP: (156-187)/(79-87) 187/85 mmHg (04/20 0430) SpO2:  [91 %-99 %] 91 % (04/20 0430) Weight:  [99.791 kg (220 lb)] 99.791 kg (220 lb) (04/19 2216)   Physical Exam  Constitutional: Appears well-developed and well-nourished.  Psych: Affect appropriate to situation Eyes: No scleral injection HENT: No OP obstrucion Head: Normocephalic.  Cardiovascular: Normal rate and regular rhythm.  Respiratory: Effort normal and breath sounds normal to anterior ascultation GI: Soft.  No distension. There is no tenderness.  Skin: WDI  Neuro: Mental Status: Patient is awake, alert, oriented to person, place, month, year, and situation. Patient is able to give a clear and coherent history. No signs of aphasia or neglect Cranial Nerves: II: Visual Fields are full. Pupils are equal, round, and reactive to light.  III,IV, VI: EOMI without ptosis or diploplia.  V: Facial sensation is symmetric to temperature VII: Facial movement is symmetric.  VIII: hearing is intact to voice X: Uvula elevates symmetrically XI: Shoulder shrug is symmetric. XII: tongue is midline without atrophy or fasciculations.  Motor: Tone is  normal. Bulk is normal. 5/5 strength was present in all four extremities.  Sensory: Sensation is symmetric to light touch and temperature in the arms and legs. Deep Tendon Reflexes: 2+ and symmetric in the biceps and patellae.  Plantars: Toes are downgoing bilaterally. Cerebellar: FNF and HKS are intact bilaterally  Cr 1.35 I have reviewed the images obtained:At least 3 subcentimeter foci of probable acute ischemia RIGHT occipital lobe. Focal loss of RIGHT vertebral artery V4 flow void concerning for focal occlusion.  Moderate to severe chronic small vessel ischemic disease.  Old small RIGHT cerebellar and LEFT pontine infarcts. Old bilateral basal ganglia lacunar infarcts.  Impression: migranous stroke? I do not believe these are embolic phenomena.  I wonder if they can be due to his migraines - symptoms certainly are temporally related.  I am going to address both stroke and migraine here.  I am treating tonight with a migraine cocktain and will start patient on prophylactic amitriptyline.  Continue xarelto.  Will order MRA and see if there is truly a need to add baby asa to his regimen.  Echo and carotid study also ordered. LDL ordered.  The case will be followed tomorrow by the stroke attending.  Recommendations: 1) as above

## 2016-01-06 NOTE — Progress Notes (Signed)
PROGRESS NOTE  Adrian Melendez:[id number] DOB: 21-Jun-1935 DOA: 01/06/2016 PCP: Garlan Fillers, MD Outpatient Specialists:    LOS: 0 days   Brief Narrative: Adrian Melendez is a 81 year old male with a past medical history of ocular migraines, hypertension, gout and atrial fibrillation. Presented to the ED on 4/19 for increasing frequency and persistence of bilateral reddish-brown hue in visual field.Found to have multiple small areas of possible acute CVA in right occipital lobe on MRI.  Subjective: Daughter at bedside. No new complaints. Explained MRI findings and answered any questions.   Assessment & Plan: Right occipital infarct:Not sure whether these are migrainous infarcts or related to athersclerosis-as MRA brain showed proximal basilar aretery stenosis. Await further recommendations from Neurology. Await Echo. Carotid Doppler did not show significant stenosis, LDL elevated at 158-although not tolerated statins in the past-willing to try crestor.     Hyperlipidemia: See above.   Retinal acephalgic migraines: without headaches-but with visual symptoms-much better today. Neurology following-started on Depakote  Gout:  No acute exacerbation at this time, continue allopurinol.  Hypertension: uncontrolled-will allow some permissive hypertension. Currently on Avapro and Micardis HCT.Resume Amlodipine and follow.   Hypokalemia: Repleted with K-Dur on 4/20. Check levels  Atrial fibrillation: Continue Xarelto 20 mg daily-currently in sinus rhythm.  DVT prophylaxis: Xarelto Code Status: Full Family Communication: Daughter at bedside Disposition Plan: Home when stable  Consultants:  Neurology and vascular   Procedures:  Echocardiogram pending Carotid Doppler-1-39% ICA plaquing  Objective: Filed Vitals:   01/06/16 0400 01/06/16 0430 01/06/16 0430 01/06/16 0536  BP: 165/84 187/85  195/91  Pulse: 62 61  69  Temp:   97.9 F (36.6 C) 97.9 F (36.6 C)  TempSrc:     Oral  Resp: Height:     (1.803 m)  Weight:    99.338 kg (219 lb)  SpO2: 95% 91%  97%    Intake/Output Summary (Last 24 hours) at 01/06/16 1129 Last data filed at 01/06/16 0606  Gross per 24 hour  Intake    120 ml  Output      0 ml  Net    120 ml   Filed Weights   01/05/16 2216 01/06/16 0536  Weight: 99.791 kg (220 lb) 99.338 kg (219 lb)    Examination:  Filed Vitals:   01/06/16 0400 01/06/16 0430 01/06/16 0430 01/06/16 0536  BP: 165/84 187/85  195/91  Pulse: 62 61  69  Temp:   97.9 F (36.6 C) 97.9 F (36.6 C)  TempSrc:    Oral  Resp: Height:     (1.803 m)  Weight:    99.338 kg (219 lb)  SpO2: 95% 91%  97%   Constitutional: NAD, well nourished, well developed Eyes: PERRL Neck: Supple, no JVD Respiratory: clear to auscultation bilaterally. Normal respiratory effort. No accessory muscle use.  Cardiovascular: Regular rate and rhythm.  Abdomen: Soft, non tender, non distended. Bowel sounds positive.  Skin: no rashes Neurologic: non focal Psychiatric: Normal judgment and insight. Alert and oriented x 3. Normal mood.    Data Reviewed: I have personally reviewed following labs and imaging studies  CBC:  Recent Labs Lab 01/05/16 2250  WBC 7.8  NEUTROABS 5.0  HGB 14.6  HCT 42.7  MCV 91.2  PLT 237   Basic Metabolic Panel:  Recent Labs Lab 01/05/16 2250  NA 140  K 3.4*  CL 102  CO2 24  GLUCOSE 133*  BUN 18  CREATININE 1.35*  CALCIUM 9.3   GFR: Estimated Creatinine Clearance: 54.2 mL/min (by C-G formula based on Cr of 1.35).  Liver Function Tests:  Recent Labs Lab 01/05/16 2250  AST 23  ALT 17  ALKPHOS 96  BILITOT 0.8  PROT 7.4  ALBUMIN 4.1   Lipid Profile:  Recent Labs  01/05/16 0451  CHOL 223*  HDL 34*  LDLCALC 158*  TRIG 157*  CHOLHDL 6.6   Urine analysis:    Component Value Date/Time   COLORURINE YELLOW 03/04/2007 0857   APPEARANCEUR CLEAR 03/04/2007 0857   LABSPEC 1.010 03/04/2007 0857     PHURINE 6.5 03/04/2007 0857   GLUCOSEU NEGATIVE 03/04/2007 0857   HGBUR NEGATIVE 03/04/2007 0857   BILIRUBINUR NEGATIVE 03/04/2007 0857   KETONESUR NEGATIVE 03/04/2007 0857   PROTEINUR NEGATIVE 03/04/2007 0857   UROBILINOGEN 0.2 03/04/2007 0857   NITRITE NEGATIVE 03/04/2007 0857   LEUKOCYTESUR  03/04/2007 0857    NEGATIVE MICROSCOPIC NOT DONE ON URINES WITH NEGATIVE PROTEIN, BLOOD, LEUKOCYTES, NITRITE, OR GLUCOSE <1000 mg/dL.     Radiology Studies: Mr Shirlee Latch [facility] Contrast  01/06/2016  CLINICAL DATA:  81 year old male with worsening visual disturbance. Multiple punctate infarcts in the right occipital lobe on MRI. Abnormal distal right vertebral artery on MRI. Initial encounter. EXAM: MRA HEAD WITHOUT CONTRAST TECHNIQUE: Angiographic images of the Circle of Willis were obtained using MRA technique without intravenous contrast. COMPARISON:  Brain MRI 0303 hours today. Brain MRI and MRA 03/04/2007. FINDINGS: The left vertebral artery is chronically dominant. Since 2008, the right vertebral artery has occluded just distal to the right PICA origin. The right PICA remains patent (series 305, image 14). No stenosis of the distal left vertebral artery which supplies the basilar. The left PICA remains patent. Irregularity in the proximal third of the basilar artery has progressed and there is now moderate to severe stenosis (series 305, image 14). The basilar remains patent. Mild to moderate distal basilar irregularity persists, but without stenosis. Both SCA origins remain patent. The right PCA origin is stable. Right PCA branches have not significantly changed. There is a new severe stenosis at the left PCA origin (series 305, image 14). The left PCA remains patent. There is superimposed new moderate irregularity and stenosis in the left PCA P2 segment. Preserved distal left PCA flow. Posterior communicating arteries are diminutive or absent. Stable antegrade flow in both ICA siphons. Both carotid termini  remain patent. No siphon stenosis identified. Normal ophthalmic artery origins. ACA origins remain normal. There is new mild stenosis at both MCA origins. Visualized bilateral MCA branches and visualized bilateral ACA branches appear stable and within normal limits. IMPRESSION: 1. The Right PCA is within normal limits. 2. Severe proximal Basilar artery stenosis, and severe Left PCA origin stenosis have developed since 2008. 3. 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 Right PICA remains patent. The left vertebral artery is chronically dominant. 4. The anterior circulation is more stable since 2008, with only mild bilateral MCA origin stenoses. Electronically Signed   By: Odessa Fleming M.D.   On: 01/06/2016 07:33   [name]  01/06/2016  CLINICAL DATA:  Worsening intermittent visual disturbances for 6 days. Intermittent head fullness, nasal congestion and tinnitus. History of migraines, hypertension, hyperlipidemia. EXAM: MRI HEAD WITHOUT CONTRAST TECHNIQUE: Multiplanar, multiecho pulse sequences of the brain and surrounding structures were obtained without intravenous contrast. COMPARISON:  CT head February 29, 2012 and MRI of the head March 04, 2007  FINDINGS: INTRACRANIAL CONTENTS: At least 3 foci of reduced diffusion RIGHT occipital lobe measure up to 4 mm, difficult to localize on ADC map due to small size. Small foci of susceptibility artifact RIGHT insula, LEFT mesial thalamus. The ventricles and sulci are normal for patient's age. Patchy to confluent supratentorial white matter FLAIR T2 hyperintensities. Old small RIGHT cerebellar, LEFT pontine infarcts. Old bilateral basal ganglia lacunar infarcts. No suspicious parenchymal signal, mass lesions, mass effect. No abnormal extra-axial fluid collections. No extra-axial masses though, contrast enhanced sequences would be more sensitive. Focal loss of RIGHT V4 flow void. dolicoectatic intracranial vessels. ORBITS:  The included ocular globes and orbital contents are non-suspicious. SINUSES: Small RIGHT maxillary mucosal retention cysts, small LEFT frontal sinus mucosal retention cyst. The mastoid air-cells and included paranasal sinuses are otherwise well-aerated. SKULL/SOFT TISSUES: No abnormal sellar expansion. No suspicious calvarial bone marrow signal. Craniocervical junction maintained. IMPRESSION: At least 3 subcentimeter foci of probable acute ischemia RIGHT occipital lobe. Focal loss of RIGHT vertebral artery V4 flow void concerning for focal occlusion. Moderate to severe chronic small vessel ischemic disease. Old small RIGHT cerebellar and LEFT pontine infarcts. Old bilateral basal ganglia lacunar infarcts. Electronically Signed   By: Awilda Metroourtnay  Bloomer M.D.   On: 01/06/2016 03:53     Scheduled Meds: . allopurinol  450 mg Oral Daily  . amitriptyline  25 mg Oral QHS  . amLODipine  5 mg Oral Daily  . irbesartan  300 mg Oral Daily   And  . hydrochlorothiazide  25 mg Oral Daily  . potassium chloride SA  20 mEq Oral Daily  . rivaroxaban  20 mg Oral Q supper  . sodium chloride flush  3 mL Intravenous Q12H   Time spent: 45 minutes   [facility] Pager [phone]  If 7PM-7AM, please contact night-coverage [email or web address] Password [email or web address] 01/06/2016, 11:29 AM

## 2016-01-06 NOTE — H&P (Signed)
[facility] History and Physical  Adrian Melendez WJX:[id number] DOB: [birth year] DOA: 01/06/2016  Referring physician:  PCP: Garlan Fillers, MD   Chief Complaint: I'm seeing a red color in my visual field  HPI: Adrian Melendez is a 81 y.o. male  81 year old with past medical history significant for ocular migraines, hypertension, atrial fibrillation on Xarelto and gout came in the hospital tonight because patient said that for many years patient had colorful spicules that he would see in his visual field about once every 6 months, typically lasting just a few minutes. Patient said that he typically sees reddish-brown hue in his visual field. Since Saturday patient reported that the symptoms have increased frequency about 6 times per day, this afternoon said that his left visual field was completely reddish-brown in color, he was still able to see. Any associated headache. He had no other neurologic symptoms, denied any numbness tingling weakness in any other part of his body. He did not have any recent fever chills or sweating. Because of the persistence came to the emergency room.  On arrival his vitals of been stable, CBC was benign, metabolic panel showed a mildly decreased potassium at 3.4, creatinine mildly elevated at 1.35. Patient underwent an MRI of the brain which showed 3 subcentimeter foci in the right occipital lobe, focal loss of right vertebral artery in the V4 branch concerning for focal occlusion, moderate to severe chronic small vessel disease. Patient was seen by Dr. Cena Benton of neurology and then hospitalist called to admit.    Review of Systems:  Constitutional:  No weight loss, night sweats, Fevers, chills, fatigue.  HEENT:  No headaches, Difficulty swallowing,Tooth/dental problems,Sore throat, ocular symptoms as above in the history of present illness Cardio-vascular:  No chest pain, Orthopnea, PND, swelling in lower extremities, anasarca, dizziness,  palpitations  GI:  No heartburn, indigestion, abdominal pain, nausea, vomiting, diarrhea, change in bowel habits, loss of appetite  Resp:   No shortness of breath with exertion or at rest. No excess mucus, no productive cough, No non-productive cough, No coughing up of blood.No change in color of mucus.No wheezing.No chest wall deformity  Skin:  no rash or lesions.  GU:  no dysuria, change in color of urine, no urgency or frequency. No flank pain.  Musculoskeletal:   No joint pain or swelling. No decreased range of motion. No back pain.  Psych:  No change in mood or affect. No depression or anxiety. No memory loss.  Neuro:  No change in sensation, unilateral strength, or cognitive abilities. Ocular symptoms as above in history of present illness  All other systems were reviewed and are negative.  Past Medical History  Diagnosis Date  . Hypertension   . Gout   . Dizziness 02/29/2012  . Syncope, near 02/29/2012  . Hx-TIA (transient ischemic attack) June 2008  . Hx of migraines   . Gout   . Hyperlipidemia     statin intolerance  . History of nuclear stress test 03/28/2012    lexiscan myoview; negative for ischemia  . PAF (paroxysmal atrial fibrillation) (HCC)     xarelto  . Headache   . Migraines     classic ophthalmic  . Colon polyps     colonoscopy 2006  . Hyperlipidemia   . Hyperglycemia   . Migraine aura without headache    Past Surgical History  Procedure Laterality Date  . Eye surgery      laser surgery to seal vein   . Transthoracic echocardiogram  03/01/2012  EF 55-60% with normal systolic function; mild AV regurg; calcified mitral annulus - ordered for syncope  . Colonoscopy      2006,11/12   Social History:  reports that he has never smoked. He has never used smokeless tobacco. He reports that he does not drink alcohol or use illicit drugs.  Allergies  Allergen Reactions  . Statins     Cause leg weakness     Family History  Problem Relation Age of  Onset  . Cancer Maternal Grandmother   . Heart disease Maternal Grandfather   . Pneumonia Paternal Grandfather      Prior to Admission medications   Medication Sig Start Date End Date Taking? Authorizing Provider  allopurinol (ZYLOPRIM) 300 MG tablet Take 450 mg by mouth daily.   Yes Historical Provider, MD  amLODipine (NORVASC) 5 MG tablet Take 5 mg by mouth daily.   Yes Historical Provider, MD  rivaroxaban (XARELTO) 20 MG TABS tablet TAKE 1 TABLET BY MOUTH DAILY WITH SUPPER. 10/29/15  Yes Runell GessJonathan J Berry, MD  telmisartan-hydrochlorothiazide (MICARDIS HCT) 80-25 MG per tablet Take 1 tablet by mouth daily.   Yes Historical Provider, MD  potassium chloride SA (K-DUR,KLOR-CON) 20 MEQ tablet TAKE ONE TABLET BY MOUTH ONE TIME DAILY Patient not taking: Reported on 01/06/2016 06/05/13   Runell GessJonathan J Berry, MD   Physical Exam: Filed Vitals:   01/06/16 0400 01/06/16 0430 01/06/16 0430 01/06/16 0536  BP: 165/84 187/85  195/91  Pulse: 62 61  69  Temp:   97.9 F (36.6 C) 97.9 F (36.6 C)  TempSrc:    Oral  Resp: 16 13  18   Height:    5\' 11"  (1.803 m)  Weight:    99.338 kg (219 lb)  SpO2: 95% 91%  97%    Wt Readings from Last 3 Encounters:  01/06/16 99.338 kg (219 lb)  11/25/15 102.059 kg (225 lb)  07/29/15 100.699 kg (222 lb)    General:  Appears calm and comfortable Eyes:  PERRL, EOMI, normal lids, iris, visual fields intact ENT:  grossly normal hearing, lips & tongue Neck:  no LAD, masses or thyromegaly Cardiovascular:  RRR, no m/r/g. No LE edema.  Respiratory:  CTA bilaterally, no w/r/r. Normal respiratory effort. Abdomen:  soft, ntnd Skin:  no rash or induration seen on limited exam Musculoskeletal:  grossly normal tone BUE/BLE Psychiatric:  grossly normal mood and affect, speech fluent and appropriate Neurologic:  CN 2-12 grossly intact, moves all extremities in coordinated fashion.          Labs on Admission:  Basic Metabolic Panel:  Recent Labs Lab 01/05/16 2250  NA  140  K 3.4*  CL 102  CO2 24  GLUCOSE 133*  BUN 18  CREATININE 1.35*  CALCIUM 9.3   Liver Function Tests:  Recent Labs Lab 01/05/16 2250  AST 23  ALT 17  ALKPHOS 96  BILITOT 0.8  PROT 7.4  ALBUMIN 4.1   No results for input(s): LIPASE, AMYLASE in the last 168 hours. No results for input(s): AMMONIA in the last 168 hours. CBC:  Recent Labs Lab 01/05/16 2250  WBC 7.8  NEUTROABS 5.0  HGB 14.6  HCT 42.7  MCV 91.2  PLT 237   Cardiac Enzymes: No results for input(s): CKTOTAL, CKMB, CKMBINDEX, TROPONINI in the last 168 hours.  BNP (last 3 results) No results for input(s): BNP in the last 8760 hours.  ProBNP (last 3 results) No results for input(s): PROBNP in the last 8760 hours.   CREATININE: 1.35  mg/dL ABNORMAL (16/10/96 0454) Estimated creatinine clearance - 54.2 mL/min  CBG: No results for input(s): GLUCAP in the last 168 hours.  Radiological Exams on Admission: [name]  01/06/2016  CLINICAL DATA:  Worsening intermittent visual disturbances for 6 days. Intermittent head fullness, nasal congestion and tinnitus. History of migraines, hypertension, hyperlipidemia. EXAM: MRI HEAD WITHOUT CONTRAST TECHNIQUE: Multiplanar, multiecho pulse sequences of the brain and surrounding structures were obtained without intravenous contrast. COMPARISON:  CT head February 29, 2012 and MRI of the head March 04, 2007 FINDINGS: INTRACRANIAL CONTENTS: At least 3 foci of reduced diffusion RIGHT occipital lobe measure up to 4 mm, difficult to localize on ADC map due to small size. Small foci of susceptibility artifact RIGHT insula, LEFT mesial thalamus. The ventricles and sulci are normal for patient's age. Patchy to confluent supratentorial white matter FLAIR T2 hyperintensities. Old small RIGHT cerebellar, LEFT pontine infarcts. Old bilateral basal ganglia lacunar infarcts. No suspicious parenchymal signal, mass lesions, mass effect. No abnormal extra-axial fluid collections. No  extra-axial masses though, contrast enhanced sequences would be more sensitive. Focal loss of RIGHT V4 flow void. dolicoectatic intracranial vessels. ORBITS: The included ocular globes and orbital contents are non-suspicious. SINUSES: Small RIGHT maxillary mucosal retention cysts, small LEFT frontal sinus mucosal retention cyst. The mastoid air-cells and included paranasal sinuses are otherwise well-aerated. SKULL/SOFT TISSUES: No abnormal sellar expansion. No suspicious calvarial bone marrow signal. Craniocervical junction maintained. IMPRESSION: At least 3 subcentimeter foci of probable acute ischemia RIGHT occipital lobe. Focal loss of RIGHT vertebral artery V4 flow void concerning for focal occlusion. Moderate to severe chronic small vessel ischemic disease. Old small RIGHT cerebellar and LEFT pontine infarcts. Old bilateral basal ganglia lacunar infarcts. Electronically Signed   By: Awilda Metro M.D.   On: 01/06/2016 03:53    EKG: Independently reviewed. Sinus rhythm  Assessment/Plan Active Problems:   Stroke (cerebrum) (HCC)   CVA (cerebral infarction)   1. Right occipital infarct. Patient with visual aura like symptoms, case discussed with Dr. Cena Benton of general neurology. Suspects he may be having migrainous stroke. Was not thought to be embolic, currently patient is on Xarelto 20 mg daily for atrial fibrillation. Dr. Cena Benton recommended hold aspirin at this time, MRA is pending. Follow up echocardiogram, lipid panel. Monitor on telemetry. PT/OT consult  2. Gout. No acute exacerbation at this time, continue albuterol for 50 mg daily  3. Hypertension. Blood pressure elevated currently, continue morphine 5 mg daily, telmisartan/100 chlorothiazide 80-25 milligrams daily. When necessary hydralazine  4. Hypokalemia. Repleted with K-Dur.  5. Atrial fibrillation. Continue Xarelto 20 mg daily, EKG showed a rate controlled rhythm.  Code Status: Full  DVT Prophylaxis: Xarelto 20 mg daily  Family  Communication: Wife and daughter at bedside Disposition Plan: Pending Improvement    Joella Prince, MD Family Medicine [facility] [email or web address] Password [email or web address]

## 2016-01-06 NOTE — ED Notes (Signed)
Admitting at bedside 

## 2016-01-07 ENCOUNTER — Inpatient Hospital Stay (HOSPITAL_COMMUNITY): Payer: Medicare Other

## 2016-01-07 DIAGNOSIS — G459 Transient cerebral ischemic attack, unspecified: Secondary | ICD-10-CM

## 2016-01-07 DIAGNOSIS — I4891 Unspecified atrial fibrillation: Secondary | ICD-10-CM

## 2016-01-07 LAB — LIPID PANEL
Cholesterol: 216 mg/dL — ABNORMAL HIGH (ref 0–200)
HDL: 31 mg/dL — ABNORMAL LOW (ref >40)
LDL CALC: 158 mg/dL — AB (ref 0–99)
TRIGLYCERIDES: 135 mg/dL (ref <150)
Total CHOL/HDL Ratio: 7 RATIO
VLDL: 27 mg/dL (ref 0–40)

## 2016-01-07 LAB — BASIC METABOLIC PANEL
ANION GAP: 12 (ref 5–15)
BUN: 24 mg/dL — ABNORMAL HIGH (ref 6–20)
CHLORIDE: 102 mmol/L (ref 101–111)
CO2: 24 mmol/L (ref 22–32)
CREATININE: 1.44 mg/dL — AB (ref 0.61–1.24)
Calcium: 9 mg/dL (ref 8.9–10.3)
GFR calc non Af Amer: 45 mL/min — ABNORMAL LOW (ref >60)
GFR, EST AFRICAN AMERICAN: 52 mL/min — AB (ref >60)
Glucose, Bld: 105 mg/dL — ABNORMAL HIGH (ref 65–99)
POTASSIUM: 3.8 mmol/L (ref 3.5–5.1)
SODIUM: 138 mmol/L (ref 135–145)

## 2016-01-07 LAB — ECHOCARDIOGRAM COMPLETE
HEIGHTINCHES: 71 in
WEIGHTICAEL: 3504 [oz_av]

## 2016-01-07 MED ORDER — ASPIRIN 81 MG PO CHEW
81.0000 mg | CHEWABLE_TABLET | Freq: Every day | ORAL | Status: DC
Start: 2016-01-07 — End: 2016-05-08

## 2016-01-07 MED ORDER — ROSUVASTATIN CALCIUM 20 MG PO TABS: 20.0000 mg | ORAL_TABLET | Freq: Every day | ORAL | Status: DC

## 2016-01-07 MED ORDER — DIVALPROEX SODIUM ER 500 MG PO TB24: 500.0000 mg | ORAL_TABLET | Freq: Every day | ORAL | Status: DC

## 2016-01-07 MED ORDER — AMLODIPINE BESYLATE 5 MG PO TABS: 10.0000 mg | ORAL_TABLET | Freq: Every day | ORAL | Status: DC

## 2016-01-07 NOTE — Telephone Encounter (Signed)
Pt is admitted at this time.

## 2016-01-07 NOTE — Evaluation (Signed)
Speech Language Pathology Evaluation Patient Details Name: Adrian Melendez MRN: [id number] DOB: 02-Oct-1934 Today's Date: 01/07/2016 Time: 4098-1191 SLP Time Calculation (min) (ACUTE ONLY): 12 min  Problem List:  Patient Active Problem List   Diagnosis Date Noted  . Stroke (cerebrum) (HCC) 01/06/2016  . CVA (cerebral infarction) 01/06/2016  . Acute CVA (cerebrovascular accident) (HCC) 01/06/2016  . PAF (paroxysmal atrial fibrillation) by Holter June 2012 05/28/2013  . Chronic anticoagulation- Xarelto 05/28/2013  . Dyslipidemia- statin intol 05/28/2013  . Dizziness associated with nausea june 2013- suspected to be secondary to PAF with pauses 02/29/2012  . Syncope, near- June 2013 02/29/2012  . HTN (hypertension) 02/29/2012  . Hx-TIA (transient ischemic attack) June 2008 02/29/2012   Past Medical History:  Past Medical History  Diagnosis Date  . Hypertension   . Gout   . Dizziness 02/29/2012  . Syncope, near 02/29/2012  . Hx-TIA (transient ischemic attack) June 2008  . Hx of migraines   . Gout   . Hyperlipidemia     statin intolerance  . History of nuclear stress test 03/28/2012    lexiscan myoview; negative for ischemia  . PAF (paroxysmal atrial fibrillation) (HCC)     xarelto  . Headache   . Migraines     classic ophthalmic  . Colon polyps     colonoscopy 2006  . Hyperlipidemia   . Hyperglycemia   . Migraine aura without headache    Past Surgical History:  Past Surgical History  Procedure Laterality Date  . Eye surgery      laser surgery to seal vein   . Transthoracic echocardiogram  03/01/2012    EF 55-60% with normal systolic function; mild AV regurg; calcified mitral annulus - ordered for syncope  . Colonoscopy      2006,11/12   HPI:  Adrian Melendez is a 81 y.o. male with PMH ocular migraines, hypertension, atrial fibrillation on Xarelto and gout came in the hospital tonight because patient said that for many years patient had colorful spicules that he would see in his  visual field about once every 6 months, typically lasting just a few minutes. Patient said that he typically sees reddish-brown hue in his visual field. Since Saturday patient reported that the symptoms have increased frequency about 6 times per day, this afternoon said that his left visual field was completely reddish-brown in color, he was still able to see. Any associated headache. He had no other neurologic symptoms, denied any numbness tingling weakness in any other part of his body. MRI showed R occipital lobe acute ischemia. Passed stroke swallow screen. Speech language eval ordered as part of stroke workup.    Assessment / Plan / Recommendation Clinical Impression  Adrian Melendez currently demonstrating motor speech, language, and cognitive skills within functional limits for tasks assessed. Adrian Melendez reported occasionally having word finding difficulties which has been ongoing prior to this admission; however, this was not noted during evaluation. No SLP f/u recommended at this time and will sign off. Please re-consult if needs arise.    SLP Assessment  Patient does not need any further Speech Lanaguage Pathology Services    Follow Up Recommendations  None    Frequency and Duration           SLP Evaluation Prior Functioning  Cognitive/Linguistic Baseline: Within functional limits Type of Home: House  Lives With: Spouse Available Help at Discharge: Family Vocation: Retired   IT consultant  Overall Cognitive Status: Within Functional Limits for tasks assessed Arousal/Alertness: Awake/alert Orientation Level: Oriented X4 Attention: Selective Selective  Attention: Appears intact Memory: Appears intact Awareness: Appears intact Problem Solving: Appears intact Safety/Judgment: Appears intact    Comprehension  Auditory Comprehension Overall Auditory Comprehension: Appears within functional limits for tasks assessed Yes/No Questions: Within Functional Limits Commands: Within Functional  Limits Conversation: Complex Visual Recognition/Discrimination Discrimination: Not tested Reading Comprehension Reading Status: Not tested    Expression Expression Primary Mode of Expression: Verbal Verbal Expression Overall Verbal Expression: Appears within functional limits for tasks assessed Initiation: No impairment Level of Generative/Spontaneous Verbalization: Conversation Naming: No impairment Pragmatics: No impairment Non-Verbal Means of Communication: Not applicable   Oral / Motor  Oral Motor/Sensory Function Overall Oral Motor/Sensory Function: Within functional limits Motor Speech Overall Motor Speech: Appears within functional limits for tasks assessed Respiration: Within functional limits Phonation: Normal Resonance: Within functional limits Articulation: Within functional limitis Intelligibility: Intelligible Motor Planning: Witnin functional limits Motor Speech Errors: Not applicable   GO                    Metro KungOleksiak, Amy K, MA, CCC-SLP 01/07/2016, 1:32 PM [id number]

## 2016-01-07 NOTE — Care Management Note (Signed)
Case Management Note  Patient Details  Name: Mellody DrownWilliam R Rada MRN: [id number] Date of Birth: 07/14/1935  Subjective/Objective:   Patient admitted with CVA. He is from home with his spouse.                 Action/Plan: Awaiting PT/OT recs. CM following for discharge disposition.   Expected Discharge Date:                  Expected Discharge Plan:     In-House Referral:     Discharge planning Services     Post Acute Care Choice:    Choice offered to:     DME Arranged:    DME Agency:     HH Arranged:    HH Agency:     Status of Service:  In process, will continue to follow  Medicare Important Message Given:    Date Medicare IM Given:    Medicare IM give by:    Date Additional Medicare IM Given:    Additional Medicare Important Message give by:     If discussed at Long Length of Stay Meetings, dates discussed:    Additional Comments:  Kermit BaloKelli F Anaia Frith, RN 01/07/2016, 10:58 AM

## 2016-01-07 NOTE — Evaluation (Signed)
Physical Therapy Evaluation and Discharge Patient Details Name: Adrian Melendez MRN: [id number] DOB: 05-Jul-1935 Today's Date: 01/07/2016   History of Present Illness  Pt is a 81 y/o M admitted after c/o squigles and colorful bright colors that progress to big shapes and move around.  Imaging revealed multiple small Rt occipital infarcts secondary to known a-fib.  Pt has old Rt cerebellar, Lt pontine, and Bil basal ganglia lacunes.  [name] PMH includes gout, near syncope, TIA.    Clinical Impression  Pt admitted with above diagnosis. Pt independent w/ all mobility.  No instability w/ high level balance activities.  No skilled PT needs identified, PT is signing off.    Follow Up Recommendations No PT follow up    Equipment Recommendations  None recommended by PT    Recommendations for Other Services       Precautions / Restrictions Precautions Precaution Comments: h/o a-fib Restrictions Weight Bearing Restrictions: No      Mobility  Bed Mobility Overal bed mobility: Independent             General bed mobility comments: No assist or cues needed.  Transfers Overall transfer level: Independent Equipment used: None             General transfer comment: No assist or cues needed.  Ambulation/Gait Ambulation/Gait assistance: Independent Ambulation Distance (Feet): 200 Feet Assistive device: None Gait Pattern/deviations: WFL(Within Functional Limits)   Gait velocity interpretation: at or above normal speed for age/gender General Gait Details: No instability noted w/ high level balance activities.  Ind w/o AD.  Stairs Stairs: Yes Stairs assistance: Independent Stair Management: Two rails;Forwards;Alternating pattern Number of Stairs: 2 General stair comments: No instability noted.  Independent.  Wheelchair Mobility    Modified Rankin (Stroke Patients Only) Modified Rankin (Stroke Patients Only) Pre-Morbid Rankin Score: No symptoms Modified Rankin: No  symptoms     Balance Overall balance assessment: Independent Sitting-balance support: Feet supported;No upper extremity supported Sitting balance-Leahy Scale: Good     Standing balance support: No upper extremity supported;During functional activity Standing balance-Leahy Scale: Good               High level balance activites: Sudden stops;Head turns;Other (comment);Turns;Direction changes;Backward walking (stepping over object)   Standardized Balance Assessment Standardized Balance Assessment : Dynamic Gait Index   Dynamic Gait Index Level Surface: Normal Change in Gait Speed: Normal Gait with Horizontal Head Turns: Normal Gait with Vertical Head Turns: Normal Gait and Pivot Turn: Normal Step Over Obstacle: Normal Step Around Obstacles: Normal Steps: Mild Impairment (uses rail) Total Score: 23       Pertinent Vitals/Pain Pain Assessment: No/denies pain    Home Living Family/patient expects to be discharged to:: Private residence Living Arrangements: Spouse/significant other Available Help at Discharge: Family;Available 24 hours/day Type of Home: House Home Access: Stairs to enter Entrance Stairs-Rails: Left;Right;Can reach both Entrance Stairs-Number of Steps: 4 Home Layout: One level Home Equipment: Walker - 2 wheels;Cane - single point;Wheelchair - manual      Prior Function Level of Independence: Independent         Comments: still driving, enjoys spending time on his computer     Hand Dominance   Dominant Hand: Right    Extremity/Trunk Assessment   Upper Extremity Assessment: Overall WFL for tasks assessed           Lower Extremity Assessment: Overall WFL for tasks assessed      Cervical / Trunk Assessment: Kyphotic  Communication   Communication: No difficulties  Cognition Arousal/Alertness: Awake/alert Behavior During Therapy: WFL for tasks assessed/performed Overall Cognitive Status: Within Functional Limits for tasks  assessed                      General Comments General comments (skin integrity, edema, etc.): Reviewed signs and symptoms of a stroke w/ pt and family and what to do if any are noted.    Exercises        Assessment/Plan    PT Assessment Patent does not need any further PT services  PT Diagnosis Difficulty walking   PT Problem List    PT Treatment Interventions     PT Goals (Current goals can be found in the Care Plan section) Acute Rehab PT Goals Patient Stated Goal: to go home PT Goal Formulation: All assessment and education complete, DC therapy    Frequency     Barriers to discharge        Co-evaluation               End of Session Equipment Utilized During Treatment: Gait belt Activity Tolerance: Patient tolerated treatment well Patient left: in bed;with call bell/phone within reach;with family/visitor present Nurse Communication: Mobility status         Time: [id number] PT Time Calculation (min) (ACUTE ONLY): 16 min   Charges:   PT Evaluation $PT Eval Low Complexity: 1 Procedure     PT G Codes:       Encarnacion ChuAshley Jemery Stacey PT, DPT  Pager: [phone] Phone: [phone] 01/07/2016, 2:17 PM

## 2016-01-07 NOTE — Discharge Summary (Signed)
PATIENT DETAILS Name: Adrian DrownWilliam R Vessels Age: 81 y.o. Sex: male Date of Birth: 05/18/1935 MRN: [id number]. Admitting Physician: Joella PrinceJames Sullivan, MD EXB:[id number],WNUUVOPCP:PATERSON,DANIEL G, MD  Admit Date: 01/06/2016 Discharge date: 01/07/2016  Recommendations for Outpatient Follow-up:  1. New medication-aspirin, statin  2. Ensure follow-up with neurology-Dr. Pearlean BrownieSethi 3. Please repeat lipid panel in 3 months.  4. A1c is pending-please follow  PRIMARY DISCHARGE DIAGNOSIS:  Active Problems:   Stroke (cerebrum) (HCC)   CVA (cerebral infarction)   Acute CVA (cerebrovascular accident) [facility](HCC)      PAST MEDICAL HISTORY: Past Medical History  Diagnosis Date  . Hypertension   . Gout   . Dizziness 02/29/2012  . Syncope, near 02/29/2012  . Hx-TIA (transient ischemic attack) June 2008  . Hx of migraines   . Gout   . Hyperlipidemia     statin intolerance  . History of nuclear stress test 03/28/2012    lexiscan myoview; negative for ischemia  . PAF (paroxysmal atrial fibrillation) (HCC)     xarelto  . Headache   . Migraines     classic ophthalmic  . Colon polyps     colonoscopy 2006  . Hyperlipidemia   . Hyperglycemia   . Migraine aura without headache     DISCHARGE MEDICATIONS: Current Discharge Medication List    START taking these medications   Details  aspirin 81 MG chewable tablet Chew 1 tablet (81 mg total) by mouth daily.    divalproex (DEPAKOTE ER) 500 MG 24 hr tablet Take 1 tablet (500 mg total) by mouth daily. Qty: 30 tablet, Refills: 0    rosuvastatin (CRESTOR) 20 MG tablet Take 1 tablet (20 mg total) by mouth daily. Qty: 30 tablet, Refills: 0      CONTINUE these medications which have CHANGED   Details  amLODipine (NORVASC) 5 MG tablet Take 2 tablets (10 mg total) by mouth daily. Qty: 60 tablet, Refills: 0      CONTINUE these medications which have NOT CHANGED   Details  allopurinol (ZYLOPRIM) 300 MG tablet Take 450 mg by mouth daily.    rivaroxaban (XARELTO) 20 MG TABS  tablet TAKE 1 TABLET BY MOUTH DAILY WITH SUPPER. Qty: 30 tablet, Refills: 5    telmisartan-hydrochlorothiazide (MICARDIS HCT) 80-25 MG per tablet Take 1 tablet by mouth daily.    potassium chloride SA (K-DUR,KLOR-CON) 20 MEQ tablet TAKE ONE TABLET BY MOUTH ONE TIME DAILY Qty: 30 tablet, Refills: 11        ALLERGIES:   Allergies  Allergen Reactions  . Statins     Cause leg weakness     BRIEF HPI:  See H&P, Labs, Consult and Test reports for all details in brief, patient is a 81 year old male with a past medical history of ocular migraines, hypertension, gout and atrial fibrillation. Presented to the ED on 4/19 for increasing frequency and persistence of bilateral reddish-brown hue in visual field.Found to have multiple small areas of possible acute CVA in right occipital lobe on MRI.  CONSULTATIONS:   neurology  PERTINENT RADIOLOGIC STUDIES: Mr Shirlee LatchMra Head Wo Contrast  01/06/2016  CLINICAL DATA:  81 year old male with worsening visual disturbance. Multiple punctate infarcts in the right occipital lobe on MRI. Abnormal distal right vertebral artery on MRI. Initial encounter. EXAM: MRA HEAD WITHOUT CONTRAST TECHNIQUE: Angiographic images of the Circle of Willis were obtained using MRA technique without intravenous contrast. COMPARISON:  Brain MRI 0303 hours today. Brain MRI and MRA 03/04/2007. FINDINGS: The left vertebral artery is chronically dominant. Since 2008,  the right vertebral artery has occluded just distal to the right PICA origin. The right PICA remains patent (series 305, image 14). No stenosis of the distal left vertebral artery which supplies the basilar. The left PICA remains patent. Irregularity in the proximal third of the basilar artery has progressed and there is now moderate to severe stenosis (series 305, image 14). The basilar remains patent. Mild to moderate distal basilar irregularity persists, but without stenosis. Both SCA origins remain patent. The right PCA origin is  stable. Right PCA branches have not significantly changed. There is a new severe stenosis at the left PCA origin (series 305, image 14). The left PCA remains patent. There is superimposed new moderate irregularity and stenosis in the left PCA P2 segment. Preserved distal left PCA flow. Posterior communicating arteries are diminutive or absent. Stable antegrade flow in both ICA siphons. Both carotid termini remain patent. No siphon stenosis identified. Normal ophthalmic artery origins. ACA origins remain normal. There is new mild stenosis at both MCA origins. Visualized bilateral MCA branches and visualized bilateral ACA branches appear stable and within normal limits. IMPRESSION: 1. The Right PCA is within normal limits. 2. Severe proximal Basilar artery stenosis, and severe Left PCA origin stenosis have developed since 2008. 3. 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 Right PICA remains patent. The left vertebral artery is chronically dominant. 4. The anterior circulation is more stable since 2008, with only mild bilateral MCA origin stenoses. Electronically Signed   By: Odessa Fleming M.D.   On: 01/06/2016 07:33   [name]  01/06/2016  CLINICAL DATA:  Worsening intermittent visual disturbances for 6 days. Intermittent head fullness, nasal congestion and tinnitus. History of migraines, hypertension, hyperlipidemia. EXAM: MRI HEAD WITHOUT CONTRAST TECHNIQUE: Multiplanar, multiecho pulse sequences of the brain and surrounding structures were obtained without intravenous contrast. COMPARISON:  CT head February 29, 2012 and MRI of the head March 04, 2007 FINDINGS: INTRACRANIAL CONTENTS: At least 3 foci of reduced diffusion RIGHT occipital lobe measure up to 4 mm, difficult to localize on ADC map due to small size. Small foci of susceptibility artifact RIGHT insula, LEFT mesial thalamus. The ventricles and sulci are normal for patient's age. Patchy to confluent  supratentorial white matter FLAIR T2 hyperintensities. Old small RIGHT cerebellar, LEFT pontine infarcts. Old bilateral basal ganglia lacunar infarcts. No suspicious parenchymal signal, mass lesions, mass effect. No abnormal extra-axial fluid collections. No extra-axial masses though, contrast enhanced sequences would be more sensitive. Focal loss of RIGHT V4 flow void. dolicoectatic intracranial vessels. ORBITS: The included ocular globes and orbital contents are non-suspicious. SINUSES: Small RIGHT maxillary mucosal retention cysts, small LEFT frontal sinus mucosal retention cyst. The mastoid air-cells and included paranasal sinuses are otherwise well-aerated. SKULL/SOFT TISSUES: No abnormal sellar expansion. No suspicious calvarial bone marrow signal. Craniocervical junction maintained. IMPRESSION: At least 3 subcentimeter foci of probable acute ischemia RIGHT occipital lobe. Focal loss of RIGHT vertebral artery V4 flow void concerning for focal occlusion. Moderate to severe chronic small vessel ischemic disease. Old small RIGHT cerebellar and LEFT pontine infarcts. Old bilateral basal ganglia lacunar infarcts. Electronically Signed   By: Awilda Metro M.D.   On: 01/06/2016 03:53     PERTINENT LAB RESULTS: CBC:  Recent Labs  01/05/16 2250  WBC 7.8  HGB 14.6  HCT 42.7  PLT 237   CMET CMP     Component Value Date/Time   NA 138 01/07/2016 0258   K 3.8  01/07/2016 0258   CL 102 01/07/2016 0258   CO2 24 01/07/2016 0258   GLUCOSE 105* 01/07/2016 0258   BUN 24* 01/07/2016 0258   CREATININE 1.44* 01/07/2016 0258   CALCIUM 9.0 01/07/2016 0258   PROT 7.4 01/05/2016 2250   ALBUMIN 4.1 01/05/2016 2250   AST 23 01/05/2016 2250   ALT 17 01/05/2016 2250   ALKPHOS 96 01/05/2016 2250   BILITOT 0.8 01/05/2016 2250   GFRNONAA 45* 01/07/2016 0258   GFRAA 52* 01/07/2016 0258    GFR Estimated Creatinine Clearance: 50.8 mL/min (by C-G formula based on Cr of 1.44). No results for input(s):  LIPASE, AMYLASE in the last 72 hours. No results for input(s): CKTOTAL, CKMB, CKMBINDEX, TROPONINI in the last 72 hours. Invalid input(s): POCBNP No results for input(s): DDIMER in the last 72 hours. No results for input(s): HGBA1C in the last 72 hours.  Recent Labs  01/05/16 0451 01/07/16 0258  CHOL 223* 216*  HDL 34* 31*  LDLCALC 158* 914*  TRIG 157* 135  CHOLHDL 6.6 7.0   No results for input(s): TSH, T4TOTAL, T3FREE, THYROIDAB in the last 72 hours.  Invalid input(s): FREET3 No results for input(s): VITAMINB12, FOLATE, FERRITIN, TIBC, IRON, RETICCTPCT in the last 72 hours. Coags: No results for input(s): INR in the last 72 hours.  Invalid input(s): PT Microbiology: No results found for this or any previous visit (from the past 240 hour(s)).   BRIEF HOSPITAL COURSE:  Right occipital infarct:Suspected migrainous infarcts, but could also related to athersclerosis-as MRA brain showed proximal basilar aretery stenosis. 2-D echocardiogram did not show any significant abnormalities. Carotid Doppler did not show significant stenosis.LDL elevated at 158-although not tolerated statins in the past-willing to try crestor-and seems to be tolerating it well. Seen by neurology, recommendations are to start aspirin as patient have basilar stenosis. Currently doing well without any major issues, nonfocal exam. Please note, A1c is pending please follow.  Hyperlipidemia: See above.   Retinal acephalgic migraines: without headaches-but with visual symptoms-much better today. Neurology following-started on Depakote. Will require close monitoring by neurology in the outpatient setting.  Mild ARF versus newly developed chronic kidney disease stage III: Please continue to closely monitor in the outpatient setting. Will require repeat labs and adjustment of medications if renal failure persists. We'll defer to PCP.  Gout: No acute exacerbation at this time, continue  allopurinol.  Hypertension:allowed some permissive hypertension. Resume amlodipine, on Avapro and Micardis HCT  Hypokalemia: Repleted   Atrial fibrillation: Continue Xarelto 20 mg daily-currently in sinus rhythm.  TODAY-DAY OF DISCHARGE:  Subjective:   Gavin Pound today has no headache,no chest abdominal pain,no new weakness tingling or numbness, feels much better wants to go home today.   Objective:   Blood pressure 157/63, pulse 62, temperature 98.1 F (36.7 C), temperature source Oral, resp. rate 18, height 5\' 11"  (1.803 m), weight 99.338 kg (219 lb), SpO2 91 %.  Intake/Output Summary (Last 24 hours) at 01/07/16 1456 Last data filed at 01/07/16 0700  Gross per 24 hour  Intake    240 ml  Output      0 ml  Net    240 ml   Filed Weights   01/05/16 2216 01/06/16 0536  Weight: 99.791 kg (220 lb) 99.338 kg (219 lb)    Exam Awake Alert, Oriented *3, No new F.N deficits, Normal affect [place].AT,PERRAL Supple Neck,No JVD, No cervical lymphadenopathy appriciated.  Symmetrical Chest wall movement, Good air movement bilaterally, CTAB RRR,No Gallops,Rubs or new Murmurs, No Parasternal Heave +  ve B.Sounds, Abd Soft, Non tender, No organomegaly appriciated, No rebound -guarding or rigidity. No Cyanosis, Clubbing or edema, No new Rash or bruise  DISCHARGE CONDITION: Stable  DISPOSITION: Home  DISCHARGE INSTRUCTIONS:    Activity:  As tolerated with Full fall precautions use walker/cane & assistance as needed  Get Medicines reviewed and adjusted: Please take all your medications with you for your next visit with your Primary MD  Please request your Primary MD to go over all hospital tests and procedure/radiological results at the follow up, please ask your Primary MD to get all Hospital records sent to his/her office.  If you experience worsening of your admission symptoms, develop shortness of breath, life threatening emergency, suicidal or homicidal thoughts you must seek  medical attention immediately by calling 911 or calling your MD immediately  if symptoms less severe.  You must read complete instructions/literature along with all the possible adverse reactions/side effects for all the Medicines you take and that have been prescribed to you. Take any new Medicines after you have completely understood and accpet all the possible adverse reactions/side effects.   Do not drive when taking Pain medications.   Do not take more than prescribed Pain, Sleep and Anxiety Medications  Special Instructions: If you have smoked or chewed Tobacco  in the last 2 yrs please stop smoking, stop any regular Alcohol  and or any Recreational drug use.  Wear Seat belts while driving.  Please note  You were cared for by a hospitalist during your hospital stay. Once you are discharged, your primary care physician will handle any further medical issues. Please note that NO REFILLS for any discharge medications will be authorized once you are discharged, as it is imperative that you return to your primary care physician (or establish a relationship with a primary care physician if you do not have one) for your aftercare needs so that they can reassess your need for medications and monitor your lab values.   Diet recommendation: Heart Healthy diet  Discharge Instructions    Ambulatory referral to Neurology    Complete by:  As directed   Please schedule post stroke follow up in 2 months.     Ambulatory referral to Neurology    Complete by:  As directed   Stroke office f/u in 2 month     Call MD for:  persistant nausea and vomiting    Complete by:  As directed      Call MD for:  severe uncontrolled pain    Complete by:  As directed      Diet - low sodium heart healthy    Complete by:  As directed      Increase activity slowly    Complete by:  As directed            Follow-up Information    Follow up with SETHI,PRAMOD, MD In 2 months.   Specialties:  Neurology, Radiology    Why:  Stroke Clinic, Office will call you with appointment date & time   Contact information:   [address] [place] [address] [phone]       Follow up with Garlan Fillers, MD. Schedule an appointment as soon as possible for a visit in 1 week.   Specialty:  Internal Medicine   Why:  Hospital follow up   Contact information:   [address] [place] [address] [phone]      Total Time spent on discharge equals  45 minutes.  Signed:  [facility] 01/07/2016 2:56 PM

## 2016-01-07 NOTE — Progress Notes (Signed)
Discharge orders received.  Discharge instructions and follow-up appointments reviewed with the patient and his family.  VSS upon discharge.  IV removed and education complete.  Transported out via wheelchair.  Chere Babson M, RN 

## 2016-01-07 NOTE — Progress Notes (Signed)
STROKE TEAM PROGRESS NOTE   HISTORY OF PRESENT ILLNESS Adrian Melendez is a 81 y.o. male with complex medical history including afib on xarelto - he is compliant and "retinal, acephalgic migraines" characterized by squigles and colorful phosphenes. They can last up to 2 minutes and typically come without headache. He denies any other neurological symptoms. Typically after these spells he is tired. They last 15 minutes. He has had these for the last several years, and has a frequency of approx 6 per year. But since Saturday he has been getting 6 per day or so and the pattern is increasing. Because of this he came to the ED and an MRI showed tiny cortically based strokes. Patient was not administered IV t-PA.   SUBJECTIVE (INTERVAL HISTORY) His daughter Adrian Melendez is at the bedside.  Overall he feels his condition is improved and he had only 1 retinal migraine episode today. I have reviewed echocardiogram results which show normal ejection fraction without cardiac source of embolism.  OBJECTIVE Temp:  [97.5 F (36.4 C)-98.1 F (36.7 C)] 98.1 F (36.7 C) (04/21 1344) Pulse Rate:  [55-97] 62 (04/21 1344) Cardiac Rhythm:  [-] Other (Comment);Heart block (04/21 0732) Resp:  [14-18] 18 (04/21 1344) BP: (131-190)/(63-85) 157/63 mmHg (04/21 1344) SpO2:  [91 %-98 %] 91 % (04/21 1344)  CBC:   Recent Labs Lab 01/05/16 2250  WBC 7.8  NEUTROABS 5.0  HGB 14.6  HCT 42.7  MCV 91.2  PLT 237    Basic Metabolic Panel:   Recent Labs Lab 01/05/16 2250 01/07/16 0258  NA 140 138  K 3.4* 3.8  CL 102 102  CO2 24 24  GLUCOSE 133* 105*  BUN 18 24*  CREATININE 1.35* 1.44*  CALCIUM 9.3 9.0    Lipid Panel:     Component Value Date/Time   CHOL 216* 01/07/2016 0258   TRIG 135 01/07/2016 0258   HDL 31* 01/07/2016 0258   CHOLHDL 7.0 01/07/2016 0258   VLDL 27 01/07/2016 0258   LDLCALC 158* 01/07/2016 0258   HgbA1c:  Lab Results  Component Value Date   HGBA1C 5.9* 03/01/2012   Urine  Drug Screen: No results found for: LABOPIA, COCAINSCRNUR, LABBENZ, AMPHETMU, THCU, LABBARB    IMAGING  Mr [name] Wo Contrast 01/06/2016  1. The Right PCA is within normal limits. 2. Severe proximal Basilar artery stenosis, and severe Left PCA origin stenosis have developed since 2008. 3. 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 Right PICA remains patent. The left vertebral artery is chronically dominant. 4. The anterior circulation is more stable since 2008, with only mild bilateral MCA origin stenoses.   [name] 01/06/2016   At least 3 subcentimeter foci of probable acute ischemia RIGHT occipital lobe. Focal loss of RIGHT vertebral artery V4 flow void concerning for focal occlusion. Moderate to severe chronic small vessel ischemic disease. Old small RIGHT cerebellar and LEFT pontine infarcts. Old bilateral basal ganglia lacunar infarcts.   Carotid Doppler   There is 1-39% bilateral ICA stenosis. Vertebral artery flow is antegrade.    PHYSICAL EXAM Pleasant elderly Caucasian male not in distress. . Afebrile. Head is nontraumatic. Neck is supple without bruit.    Cardiac exam no murmur or gallop. Lungs are clear to auscultation. Distal pulses are well felt. Neurological Exam ;  Awake  Alert oriented x 3. Normal speech and language.eye movements full without nystagmus.fundi were not visualized. Vision acuity  appears normal. No hemianopia on bedside  testing.Hearing is normal. Palatal movements are normal. Face symmetric. Tongue midline. Normal strength, tone, reflexes and coordination. Normal sensation. Gait deferred. ASSESSMENT/PLAN Adrian Melendez is a 81 y.o. male with history of atrial fibrillation on xarelto and retinal migraines presenting with visual auras. He did not receive IV t-PA.   Stroke:  Multiple small  right occipital infarcts embolic secondary to known atrial fibrillation vs Migrainous infarction or symptomatic  basilar artery stenosis  Resultant  Visual field abnormalities  MRI  Punctate acute infarcts R occipital lobe. Old R cerebellar, L pontine, B basal ganglia lacunes  MRA  Severe prox BA stenosis, severe L PCA origin stenosis. R VA occlusion. Anterior circulation stable.   Carotid Doppler  No significant stenosis  2D Echo  Left ventricle: The cavity size was normal. Wall thickness was  increased in a pattern of mild LVH. Systolic function was normal.  The estimated ejection fraction was in the range of 60% to 65%.  Wall motion was normal; there were no regional wall motion  abnormalities. Doppler parameters are consistent with abnormal   left ventricular relaxation (grade 1 diastolic dysfunctionLDL 158  HgbA1c pending  xarelto for VTE prophylaxis Diet Heart Room service appropriate?: Yes; Fluid consistency:: Thin Diet - low sodium heart healthy  Xarelto (rivaroxaban) daily prior to admission, now on Xarelto (rivaroxaban) daily. Add aspirin low dose given large vessel stenosis  Patient counseled to be compliant with his antithrombotic medications  Ongoing aggressive stroke risk factor management  Therapy recommendations:  Home Disposition:  home Basilar Artery stenosis  Will add low dose aspirin to xarelto given large vessel stenosis  No intervention at this time  Follow   Paroxysmal Atrial Fibrillation  Home anticoagulation:  Xarelto (rivaroxaban) daily   Continue xarelto at discharge   Hypertension  Stable, 150-180s Permissive hypertension (OK if < 220/120) but gradually normalize in 5-7 days  Hyperlipidemia  Home meds:  No statin  LDL 158, goal < 70  Intolerant to multiple statins in the past  Consider trying a different statin vs OP injections  Other Stroke Risk Factors  Advanced age  Obesity, Body mass index is 30.56 kg/(m^2).   Hx stroke/TIA  Migraines. Elavil added yesterday by neuro for prophylaxis. Received depacon this am. Will d/c  elavil and add depakote 500 daily.  Other Active Problems  Gout   Hypokalemia, replaced  Hospital day # 1  Adrian Melendez  [facility] See Amion for Pager information 01/07/2016 2:46 PM  I had a long d/w patient and daughter about his recent stroke, risk for recurrent stroke/TIAs, personally independently reviewed imaging studies and stroke evaluation results and answered questions.Continue aspirin 81 mg daily and Xarelto (rivaroxaban) daily  for secondary stroke prevention and maintain strict control of hypertension with blood pressure goal below 130/90, diabetes with hemoglobin A1c goal below 6.5% and lipids with LDL cholesterol goal below 70 mg/dL. I also advised the patient to eat a healthy diet with plenty of whole grains, cereals, fruits and vegetables, exercise regularly and maintain ideal body weight.Greater than 50% time during this 25 minute visit was spent on counseling and coordination of care about stroke risk from atrial fibrillation, basilar stenosis, for retinal migraines and arranging follow-up   Followup in the future with me in 2 months in stroke clinic Delia HeadyPramod Jocelyn Nold, MD Medical Director [facility] Pager: [phone] 01/07/2016 2:46 PM    To contact Stroke Continuity provider, please refer to [email or web address]. After hours, contact General Neurology

## 2016-01-07 NOTE — Care Management Note (Signed)
Case Management Note  Patient Details  Name: Adrian DrownWilliam R Melendez MRN: [id number] Date of Birth: 09/21/1934  Subjective/Objective:                    Action/Plan: Pt discharging to home with self care. No further needs per CM.   Expected Discharge Date:                  Expected Discharge Plan:     In-House Referral:     Discharge planning Services     Post Acute Care Choice:    Choice offered to:     DME Arranged:    DME Agency:     HH Arranged:    HH Agency:     Status of Service:  In process, will continue to follow  Medicare Important Message Given:    Date Medicare IM Given:    Medicare IM give by:    Date Additional Medicare IM Given:    Additional Medicare Important Message give by:     If discussed at Long Length of Stay Meetings, dates discussed:    Additional Comments:  Kermit BaloKelli F Rhyann Berton, RN 01/07/2016, 3:13 PM

## 2016-01-07 NOTE — Progress Notes (Signed)
  Echocardiogram 2D Echocardiogram has been performed.  Adrian Melendez 01/07/2016, 11:40 AM

## 2016-01-08 ENCOUNTER — Telehealth: Payer: Self-pay | Admitting: Diagnostic Neuroimaging

## 2016-01-08 NOTE — Telephone Encounter (Signed)
Pt daughter called. Patient just discharged yesterday for visual aura symptoms and right occipital infarcts.   Now patient having new neurologic symptoms: - He reports right face and right arm numbness on 12/31/15 x 5 minutes. - Now had another event of right face / arm numbness today 01/08/16 x 10 minutes. - Now asymptomatic. No headaches or vision changes.   Per Dr. Pearlean BrownieSethi notes on 01/06/16 --> Patient is high risk for stroke, has failed anti-coagulation, now on xarelto + aspirin 81mg .   PLAN: - advised option to go to ER to get evaluation and CT head  - family feels ok to monitor symptoms for now, especially given recent stroke admission and extensive workup - will forward to Dr. Pearlean BrownieSethi to review on Monday   Suanne MarkerVIKRAM R. PENUMALLI, MD 01/08/2016, 10:41 AM Certified in Neurology, Neurophysiology and Neuroimaging  [facility] [address], [address] [place], [place] [address] [phone]

## 2016-01-10 LAB — HEMOGLOBIN A1C
HEMOGLOBIN A1C: 6.3 % — AB (ref 4.8–5.6)
Mean Plasma Glucose: 134 mg/dL

## 2016-01-10 NOTE — Telephone Encounter (Signed)
Granddaughter Lubertha SouthCallie Wendt [phone] called to advise after speaking with Dr. Marjory LiesPenumalli on Saturday, patient decided not to go to ER, states she's not sure if they should follow up with CT or wait and see Dr. Pearlean BrownieSethi, also states divalproex (DEPAKOTE ER) 500 MG 24 hr tablet makes him really sleepy and unsteady on his feet, states he did have episode of vertigo last night around10pm, it was brief, states it scared patient because this has never happened to him.

## 2016-01-10 NOTE — Telephone Encounter (Signed)
I spoke to the patient earlier today and discussed his concerns. He had 2 brief episodes one lasting about 30 seconds of left-sided numbness and the other one of transient leg weakness and shaking upon standing. It is unclear whether these represent true TIAs especially of the posterior circulation from symptomatic basilar stenosis. The patient presents a complex and tricky high risk situation. He is already on aspirin 81 and Xarelto. He has atrial fibrillation as well as basilar stenosis. Treatment for basilar stenosis is risky with intracranial stenting which can only be performed after Xarelto visit discontinued for 3 days. Patient understands this and at the present we will continue medical treatment. If he continues to have recurrent TIAs particularly involving posterior circulation we may need to admit him to the hospital discontinue Xarelto for 3 days and start him on IV heparin prior to considering elective stenting. His retinal migraine symptoms seem to be much resolved on Depakote hence recommend he continue that. He did have transient dizziness for the first few days which seems to be improving

## 2016-01-12 ENCOUNTER — Observation Stay (HOSPITAL_COMMUNITY): Payer: Medicare Other

## 2016-01-12 ENCOUNTER — Inpatient Hospital Stay (HOSPITAL_COMMUNITY): Payer: Medicare Other

## 2016-01-12 ENCOUNTER — Inpatient Hospital Stay (HOSPITAL_COMMUNITY)
Admission: EM | Admit: 2016-01-12 | Discharge: 2016-01-15 | DRG: 065 | Disposition: A | Discharge status: Home / Self Care | Payer: Medicare Other | Attending: Internal Medicine | Admitting: Internal Medicine

## 2016-01-12 ENCOUNTER — Encounter (HOSPITAL_COMMUNITY): Payer: Self-pay | Admitting: Emergency Medicine

## 2016-01-12 ENCOUNTER — Emergency Department (HOSPITAL_COMMUNITY): Payer: Medicare Other

## 2016-01-12 DIAGNOSIS — I4891 Unspecified atrial fibrillation: Secondary | ICD-10-CM | POA: Diagnosis present

## 2016-01-12 DIAGNOSIS — N183 Chronic kidney disease, stage 3 unspecified: Secondary | ICD-10-CM

## 2016-01-12 DIAGNOSIS — I480 Paroxysmal atrial fibrillation: Secondary | ICD-10-CM | POA: Diagnosis present

## 2016-01-12 DIAGNOSIS — Z888 Allergy status to other drugs, medicaments and biological substances status: Secondary | ICD-10-CM

## 2016-01-12 DIAGNOSIS — M109 Gout, unspecified: Secondary | ICD-10-CM | POA: Diagnosis present

## 2016-01-12 DIAGNOSIS — I63333 Cerebral infarction due to thrombosis of bilateral posterior cerebral arteries: Secondary | ICD-10-CM | POA: Insufficient documentation

## 2016-01-12 DIAGNOSIS — G459 Transient cerebral ischemic attack, unspecified: Secondary | ICD-10-CM | POA: Diagnosis not present

## 2016-01-12 DIAGNOSIS — G43109 Migraine with aura, not intractable, without status migrainosus: Secondary | ICD-10-CM | POA: Diagnosis present

## 2016-01-12 DIAGNOSIS — G43909 Migraine, unspecified, not intractable, without status migrainosus: Secondary | ICD-10-CM | POA: Diagnosis present

## 2016-01-12 DIAGNOSIS — E876 Hypokalemia: Secondary | ICD-10-CM | POA: Diagnosis present

## 2016-01-12 DIAGNOSIS — Z7982 Long term (current) use of aspirin: Secondary | ICD-10-CM | POA: Diagnosis not present

## 2016-01-12 DIAGNOSIS — I639 Cerebral infarction, unspecified: Principal | ICD-10-CM | POA: Diagnosis present

## 2016-01-12 DIAGNOSIS — Z7901 Long term (current) use of anticoagulants: Secondary | ICD-10-CM

## 2016-01-12 DIAGNOSIS — I129 Hypertensive chronic kidney disease with stage 1 through stage 4 chronic kidney disease, or unspecified chronic kidney disease: Secondary | ICD-10-CM | POA: Diagnosis present

## 2016-01-12 DIAGNOSIS — E785 Hyperlipidemia, unspecified: Secondary | ICD-10-CM | POA: Diagnosis present

## 2016-01-12 DIAGNOSIS — I10 Essential (primary) hypertension: Secondary | ICD-10-CM | POA: Diagnosis not present

## 2016-01-12 DIAGNOSIS — I951 Orthostatic hypotension: Secondary | ICD-10-CM | POA: Diagnosis present

## 2016-01-12 DIAGNOSIS — I6622 Occlusion and stenosis of left posterior cerebral artery: Secondary | ICD-10-CM | POA: Diagnosis present

## 2016-01-12 DIAGNOSIS — G4733 Obstructive sleep apnea (adult) (pediatric): Secondary | ICD-10-CM | POA: Diagnosis present

## 2016-01-12 DIAGNOSIS — I481 Persistent atrial fibrillation: Secondary | ICD-10-CM | POA: Diagnosis not present

## 2016-01-12 DIAGNOSIS — Z8673 Personal history of transient ischemic attack (TIA), and cerebral infarction without residual deficits: Secondary | ICD-10-CM | POA: Diagnosis not present

## 2016-01-12 DIAGNOSIS — G8191 Hemiplegia, unspecified affecting right dominant side: Secondary | ICD-10-CM | POA: Diagnosis present

## 2016-01-12 DIAGNOSIS — Z79899 Other long term (current) drug therapy: Secondary | ICD-10-CM | POA: Diagnosis not present

## 2016-01-12 DIAGNOSIS — I771 Stricture of artery: Secondary | ICD-10-CM | POA: Diagnosis present

## 2016-01-12 DIAGNOSIS — I63311 Cerebral infarction due to thrombosis of right middle cerebral artery: Secondary | ICD-10-CM | POA: Diagnosis not present

## 2016-01-12 DIAGNOSIS — I493 Ventricular premature depolarization: Secondary | ICD-10-CM | POA: Diagnosis present

## 2016-01-12 DIAGNOSIS — N189 Chronic kidney disease, unspecified: Secondary | ICD-10-CM | POA: Diagnosis present

## 2016-01-12 HISTORY — DX: Chronic kidney disease, stage 3 (moderate): N18.3

## 2016-01-12 HISTORY — DX: Chronic kidney disease, stage 3 unspecified: N18.30

## 2016-01-12 LAB — COMPREHENSIVE METABOLIC PANEL
ALBUMIN: 3.9 g/dL (ref 3.5–5.0)
ALT: 20 U/L (ref 17–63)
ANION GAP: 15 (ref 5–15)
AST: 26 U/L (ref 15–41)
Alkaline Phosphatase: 98 U/L (ref 38–126)
BUN: 22 mg/dL — AB (ref 6–20)
CHLORIDE: 96 mmol/L — AB (ref 101–111)
CO2: 22 mmol/L (ref 22–32)
Calcium: 8.9 mg/dL (ref 8.9–10.3)
Creatinine, Ser: 1.65 mg/dL — ABNORMAL HIGH (ref 0.61–1.24)
GFR calc Af Amer: 44 mL/min — ABNORMAL LOW (ref >60)
GFR, EST NON AFRICAN AMERICAN: 38 mL/min — AB (ref >60)
GLUCOSE: 113 mg/dL — AB (ref 65–99)
POTASSIUM: 3.1 mmol/L — AB (ref 3.5–5.1)
Sodium: 133 mmol/L — ABNORMAL LOW (ref 135–145)
Total Bilirubin: 0.6 mg/dL (ref 0.3–1.2)
Total Protein: 7 g/dL (ref 6.5–8.1)

## 2016-01-12 LAB — URINE MICROSCOPIC-ADD ON

## 2016-01-12 LAB — RAPID URINE DRUG SCREEN, HOSP PERFORMED
Amphetamines: NOT DETECTED
Barbiturates: NOT DETECTED
Benzodiazepines: NOT DETECTED
COCAINE: NOT DETECTED
OPIATES: NOT DETECTED
TETRAHYDROCANNABINOL: NOT DETECTED

## 2016-01-12 LAB — CBC WITH DIFFERENTIAL/PLATELET
BASOS ABS: 0 10*3/uL (ref 0.0–0.1)
BASOS PCT: 1 %
EOS PCT: 3 %
Eosinophils Absolute: 0.3 10*3/uL (ref 0.0–0.7)
HCT: 42.6 % (ref 39.0–52.0)
Hemoglobin: 14.6 g/dL (ref 13.0–17.0)
Lymphocytes Relative: 32 %
Lymphs Abs: 2.7 10*3/uL (ref 0.7–4.0)
MCH: 30.7 pg (ref 26.0–34.0)
MCHC: 34.3 g/dL (ref 30.0–36.0)
MCV: 89.5 fL (ref 78.0–100.0)
MONO ABS: 0.7 10*3/uL (ref 0.1–1.0)
Monocytes Relative: 9 %
Neutro Abs: 4.6 10*3/uL (ref 1.7–7.7)
Neutrophils Relative %: 55 %
PLATELETS: 250 10*3/uL (ref 150–400)
RBC: 4.76 MIL/uL (ref 4.22–5.81)
RDW: 13.2 % (ref 11.5–15.5)
WBC: 8.3 10*3/uL (ref 4.0–10.5)

## 2016-01-12 LAB — CBG MONITORING, ED: Glucose-Capillary: 107 mg/dL — ABNORMAL HIGH (ref 65–99)

## 2016-01-12 LAB — I-STAT CHEM 8, ED
BUN: 24 mg/dL — AB (ref 6–20)
CHLORIDE: 95 mmol/L — AB (ref 101–111)
Calcium, Ion: 1.03 mmol/L — ABNORMAL LOW (ref 1.13–1.30)
Creatinine, Ser: 1.5 mg/dL — ABNORMAL HIGH (ref 0.61–1.24)
GLUCOSE: 114 mg/dL — AB (ref 65–99)
HCT: 47 % (ref 39.0–52.0)
HEMOGLOBIN: 16 g/dL (ref 13.0–17.0)
POTASSIUM: 3.1 mmol/L — AB (ref 3.5–5.1)
SODIUM: 135 mmol/L (ref 135–145)
TCO2: 22 mmol/L (ref 0–100)

## 2016-01-12 LAB — GLUCOSE, CAPILLARY: GLUCOSE-CAPILLARY: 135 mg/dL — AB (ref 65–99)

## 2016-01-12 LAB — URINALYSIS, ROUTINE W REFLEX MICROSCOPIC
Bilirubin Urine: NEGATIVE
Glucose, UA: NEGATIVE mg/dL
KETONES UR: NEGATIVE mg/dL
LEUKOCYTES UA: NEGATIVE
NITRITE: NEGATIVE
PH: 6 (ref 5.0–8.0)
Protein, ur: NEGATIVE mg/dL
Specific Gravity, Urine: 1.014 (ref 1.005–1.030)

## 2016-01-12 LAB — I-STAT TROPONIN, ED: TROPONIN I, POC: 0.01 ng/mL (ref 0.00–0.08)

## 2016-01-12 LAB — APTT: APTT: 44 s — AB (ref 24–37)

## 2016-01-12 LAB — PROTIME-INR
INR: 2.78 — AB (ref 0.00–1.49)
Prothrombin Time: 28.9 seconds — ABNORMAL HIGH (ref 11.6–15.2)

## 2016-01-12 LAB — ETHANOL: Alcohol, Ethyl (B): <5 mg/dL (ref <5)

## 2016-01-12 MED ORDER — IOPAMIDOL (ISOVUE-370) INJECTION 76%
INTRAVENOUS | Status: AC
  Administered 2016-01-12: 50 mL
  Filled 2016-01-12: qty 50

## 2016-01-12 MED ORDER — RIVAROXABAN 20 MG PO TABS
20.0000 mg | ORAL_TABLET | Freq: Every day | ORAL | Status: DC
  Administered 2016-01-12 – 2016-01-14 (×3): 20 mg via ORAL
  Filled 2016-01-12 (×4): qty 1

## 2016-01-12 MED ORDER — SENNOSIDES-DOCUSATE SODIUM 8.6-50 MG PO TABS
1.0000 | ORAL_TABLET | Freq: Every evening | ORAL | Status: DC | PRN
  Filled 2016-01-12: qty 1

## 2016-01-12 MED ORDER — ASPIRIN 81 MG PO CHEW
81.0000 mg | CHEWABLE_TABLET | Freq: Every day | ORAL | Status: DC
  Administered 2016-01-12 – 2016-01-14 (×3): 81 mg via ORAL
  Filled 2016-01-12 (×3): qty 1

## 2016-01-12 MED ORDER — AMLODIPINE BESYLATE 5 MG PO TABS: 10.0000 mg | ORAL_TABLET | Freq: Every day | ORAL | Status: DC

## 2016-01-12 MED ORDER — DIVALPROEX SODIUM ER 500 MG PO TB24
500.0000 mg | ORAL_TABLET | Freq: Every day | ORAL | Status: DC
  Administered 2016-01-12 – 2016-01-14 (×3): 500 mg via ORAL
  Filled 2016-01-12 (×3): qty 1

## 2016-01-12 MED ORDER — LORAZEPAM 2 MG/ML IJ SOLN
1.0000 mg | Freq: Once | INTRAMUSCULAR | Status: AC
  Administered 2016-01-12: 1 mg via INTRAVENOUS
  Filled 2016-01-12: qty 1

## 2016-01-12 MED ORDER — SODIUM CHLORIDE 0.9 % IV SOLN
INTRAVENOUS | Status: DC
  Administered 2016-01-12: 06:00:00 via INTRAVENOUS

## 2016-01-12 MED ORDER — ALLOPURINOL 100 MG PO TABS
450.0000 mg | ORAL_TABLET | Freq: Every day | ORAL | Status: DC
Start: 2016-01-12 — End: 2016-01-15
  Administered 2016-01-12 – 2016-01-14 (×3): 450 mg via ORAL
  Filled 2016-01-12 (×3): qty 5

## 2016-01-12 MED ORDER — ROSUVASTATIN CALCIUM 20 MG PO TABS
20.0000 mg | ORAL_TABLET | Freq: Every day | ORAL | Status: DC
  Administered 2016-01-12 – 2016-01-14 (×3): 20 mg via ORAL
  Filled 2016-01-12 (×4): qty 1

## 2016-01-12 MED ORDER — POTASSIUM CHLORIDE 20 MEQ/15ML (10%) PO SOLN
40.0000 meq | Freq: Once | ORAL | Status: AC
  Administered 2016-01-12: 40 meq via ORAL
  Filled 2016-01-12: qty 30

## 2016-01-12 MED ORDER — STROKE: EARLY STAGES OF RECOVERY BOOK
Freq: Once | Status: DC
  Filled 2016-01-12: qty 1

## 2016-01-12 NOTE — Care Management Note (Signed)
Case Management Note  Patient Details  Name: Adrian Melendez MRN: [id number] Date of Birth: 11/02/1934  Subjective/Objective:      Patient admitted with stroke like symptoms. CTH negative and awaiting MRI. He is from home with his wife.               Action/Plan: Awaiting PT/OT recs. CM following for discharge disposition.   Expected Discharge Date:  01/14/16               Expected Discharge Plan:     In-House Referral:     Discharge planning Services     Post Acute Care Choice:    Choice offered to:     DME Arranged:    DME Agency:     HH Arranged:    HH Agency:     Status of Service:  In process, will continue to follow  Medicare Important Message Given:    Date Medicare IM Given:    Medicare IM give by:    Date Additional Medicare IM Given:    Additional Medicare Important Message give by:     If discussed at Long Length of Stay Meetings, dates discussed:    Additional Comments:  Kermit BaloKelli F Desira Alessandrini, RN 01/12/2016, 12:55 PM

## 2016-01-12 NOTE — Consult Note (Signed)
Admission H&P    Chief Complaint: Transient right upper extremity and right lower extremity numbness.  HPI: Adrian Melendez is an 81 y.o. male history of atrial fibrillation on Xaralto and aspirin, hypertension, hyperlipidemia and previous strokes as well as migraine headaches, presenting with transient numbness involving his right arm which woke him up around 1 AM. Symptoms subsequently resolved. While waiting in the emergency room at 3 AM he developed numbness involving his distal right lower extremity. Code stroke was activated. Deficits resolved after several minutes. CT scan of his head showed no acute intracranial abnormality. He's been experiencing intermittent lower extremity sensory symptoms over the past week. He had an MRI of his brain on 01/06/2016 which showed multiple subcentimeter and occipital infarctions. MRA showed severe proximal basal artery stenosis with severe left PCA stenosis at the origin. 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 Right PICA remains patent. NIH stroke score at the time of this evaluation was 0.  LSN: 3 AM on 01/12/2016 tPA Given: No: Deficits resolved mRankin:  Past Medical History  Diagnosis Date  . Hypertension   . Gout   . Dizziness 02/29/2012  . Syncope, near 02/29/2012  . Hx-TIA (transient ischemic attack) June 2008  . Hx of migraines   . Gout   . Hyperlipidemia     statin intolerance  . History of nuclear stress test 03/28/2012    lexiscan myoview; negative for ischemia  . PAF (paroxysmal atrial fibrillation) ([place])     xarelto  . Headache   . Migraines     classic ophthalmic  . Colon polyps     colonoscopy 2006  . Hyperlipidemia   . Hyperglycemia   . Migraine aura without headache     Past Surgical History  Procedure Laterality Date  . Eye surgery      laser surgery to seal vein   . Transthoracic echocardiogram  03/01/2012    EF 55-60% with normal systolic  function; mild AV regurg; calcified mitral annulus - ordered for syncope  . Colonoscopy      2006,11/12    Family History  Problem Relation Age of Onset  . Cancer Maternal Grandmother   . Heart disease Maternal Grandfather   . Pneumonia Paternal Grandfather    Social History:  reports that he has never smoked. He has never used smokeless tobacco. He reports that he does not drink alcohol or use illicit drugs.  Allergies:  Allergies  Allergen Reactions  . Statins     Cause leg weakness     Medications: Patient's preadmission medications were reviewed by me.  ROS: History obtained from chart review and the patient  General ROS: negative for - chills, fatigue, fever, night sweats, weight gain or weight loss Psychological ROS: negative for - behavioral disorder, hallucinations, memory difficulties, mood swings or suicidal ideation Ophthalmic ROS: negative for - blurry vision, double vision, eye pain or loss of vision ENT ROS: negative for - epistaxis, nasal discharge, oral lesions, sore throat, tinnitus or vertigo Allergy and Immunology ROS: negative for - hives or itchy/watery eyes Hematological and Lymphatic ROS: negative for - bleeding problems, bruising or swollen lymph nodes Endocrine ROS: negative for - galactorrhea, hair pattern changes, polydipsia/polyuria or temperature intolerance Respiratory ROS: negative for - cough, hemoptysis, shortness of breath or wheezing Cardiovascular ROS: negative for - chest pain, dyspnea on exertion, edema or irregular heartbeat Gastrointestinal ROS: negative for - abdominal pain, diarrhea, hematemesis, nausea/vomiting or  stool incontinence Genito-Urinary ROS: negative for - dysuria, hematuria, incontinence or urinary frequency/urgency Musculoskeletal ROS: negative for - joint swelling or muscular weakness Neurological ROS: as noted in HPI Dermatological ROS: negative for rash and skin lesion changes  Physical Examination: Blood pressure  106/67, pulse 109, temperature 98.4 F (36.9 C), temperature source Oral, resp. rate 20, height '5\' 11"'  (1.803 m), SpO2 97 %.  HEENT-  Normocephalic, no lesions, without obvious abnormality.  Normal external eye and conjunctiva.  Normal TM's bilaterally.  Normal auditory canals and external ears. Normal external nose, mucus membranes and septum.  Normal pharynx. Neck supple with no masses, nodes, nodules or enlargement. Cardiovascular - irregularly irregular rhythm, S1, S2 normal and no S3 or S4 Lungs - chest clear, no wheezing, rales, normal symmetric air entry Abdomen - soft, non-tender; bowel sounds normal; no masses,  no organomegaly Extremities - no joint deformities, effusion, or inflammation and no edema  Neurologic Examination: Mental Status: Alert, oriented, thought content appropriate.  Speech fluent without evidence of aphasia. Able to follow commands without difficulty. Cranial Nerves: II-Visual fields were normal. III/IV/VI-Pupils were equal and reacted normally to light. Extraocular movements were full and conjugate.    V/VII-no facial numbness and no facial weakness. VIII-normal. X-normal speech. XI: trapezius strength/neck flexion strength normal bilaterally XII-midline tongue extension with normal strength. Motor: 5/5 bilaterally with normal tone and bulk Sensory: Normal throughout. Deep Tendon Reflexes: 1+ and symmetric. Plantars: Flexor bilaterally Cerebellar: Normal finger-to-nose testing. Carotid auscultation: Normal  Results for orders placed or performed during the hospital encounter of 01/12/16 (from the past 48 hour(s))  CBC with Differential     Status: None   Collection Time: 01/12/16  2:20 AM  Result Value Ref Range   WBC 8.3 4.0 - 10.5 K/uL   RBC 4.76 4.22 - 5.81 MIL/uL   Hemoglobin 14.6 13.0 - 17.0 g/dL   HCT 42.6 39.0 - 52.0 %   MCV 89.5 78.0 - 100.0 fL   MCH 30.7 26.0 - 34.0 pg   MCHC 34.3 30.0 - 36.0 g/dL   RDW 13.2 11.5 - 15.5 %   Platelets 250  150 - 400 K/uL   Neutrophils Relative % 55 %   Neutro Abs 4.6 1.7 - 7.7 K/uL   Lymphocytes Relative 32 %   Lymphs Abs 2.7 0.7 - 4.0 K/uL   Monocytes Relative 9 %   Monocytes Absolute 0.7 0.1 - 1.0 K/uL   Eosinophils Relative 3 %   Eosinophils Absolute 0.3 0.0 - 0.7 K/uL   Basophils Relative 1 %   Basophils Absolute 0.0 0.0 - 0.1 K/uL  Comprehensive metabolic panel     Status: Abnormal   Collection Time: 01/12/16  2:20 AM  Result Value Ref Range   Sodium 133 (L) 135 - 145 mmol/L   Potassium 3.1 (L) 3.5 - 5.1 mmol/L   Chloride 96 (L) 101 - 111 mmol/L   CO2 22 22 - 32 mmol/L   Glucose, Bld 113 (H) 65 - 99 mg/dL   BUN 22 (H) 6 - 20 mg/dL   Creatinine, Ser 1.65 (H) 0.61 - 1.24 mg/dL   Calcium 8.9 8.9 - 10.3 mg/dL   Total Protein 7.0 6.5 - 8.1 g/dL   Albumin 3.9 3.5 - 5.0 g/dL   AST 26 15 - 41 U/L   ALT 20 17 - 63 U/L   Alkaline Phosphatase 98 38 - 126 U/L   Total Bilirubin 0.6 0.3 - 1.2 mg/dL   GFR calc non Af Amer 38 (L) >60  mL/min   GFR calc Af Amer 44 (L) >60 mL/min    Comment: (NOTE) The eGFR has been calculated using the CKD EPI equation. This calculation has not been validated in all clinical situations. eGFR's persistently <60 mL/min signify possible Chronic Kidney Disease.    Anion gap 15 5 - 15  I-stat troponin, ED (not at [facility], [facility])     Status: None   Collection Time: 01/12/16  3:18 AM  Result Value Ref Range   Troponin i, poc 0.01 0.00 - 0.08 ng/mL   Comment 3            Comment: Due to the release kinetics of cTnI, a negative result within the first hours of the onset of symptoms does not rule out myocardial infarction with certainty. If myocardial infarction is still suspected, repeat the test at appropriate intervals.   I-Stat Chem 8, ED  (not at [facility], [facility])     Status: Abnormal   Collection Time: 01/12/16  3:20 AM  Result Value Ref Range   Sodium 135 135 - 145 mmol/L   Potassium 3.1 (L) 3.5 - 5.1 mmol/L   Chloride 95 (L) 101 - 111 mmol/L   BUN 24 (H) 6  - 20 mg/dL   Creatinine, Ser 1.50 (H) 0.61 - 1.24 mg/dL   Glucose, Bld 114 (H) 65 - 99 mg/dL   Calcium, Ion 1.03 (L) 1.13 - 1.30 mmol/L   TCO2 22 0 - 100 mmol/L   Hemoglobin 16.0 13.0 - 17.0 g/dL   HCT 47.0 39.0 - 52.0 %  CBG monitoring, ED     Status: Abnormal   Collection Time: 01/12/16  3:33 AM  Result Value Ref Range   Glucose-Capillary 107 (H) 65 - 99 mg/dL   Ct Head Code Stroke W/o Cm  01/12/2016  CLINICAL DATA:  Bilateral leg weakness. Progression to right leg numbness. EXAM: CT HEAD WITHOUT CONTRAST TECHNIQUE: Contiguous axial images were obtained from the base of the skull through the vertex without intravenous contrast. COMPARISON:  Brain MRI from 6 days ago FINDINGS: Skull and Sinuses:Negative for fracture or destructive process. Visualized orbits: Negative. Brain: No evidence of acute infarction, hemorrhage, hydrocephalus, or mass lesion/mass effect. Small occipital cortex infarcts described on comparison brain MRI are not visible by CT. No evidence of infarct extension or hemorrhagic conversion. Chronic microvascular disease with remote left caudate head lacunar infarct. Critical Value/emergent results were called by telephone at the time of interpretation on 01/12/2016 at 3:25 am to Dr. Nicole Kindred, who verbally acknowledged these results. IMPRESSION: 1. No acute finding. 2. Small cortical infarcts seen on brain MRI 6 days prior are not visible. No hemorrhagic conversion or evidence of infarct extension. 3. Chronic microvascular disease. Electronically Signed   By: Monte Fantasia M.D.   On: 01/12/2016 03:27    Assessment: 81 y.o. male with multiple risk factors for stroke, including posterior cerebrovascular disease, as well as recent subcentimeter right occipital infarctions, presenting with transient right upper and lower extremity deficits as described above, likely manifestations of TIAs. However recurrent small ischemic strokes cannot be ruled out.  Stroke Risk Factors - atrial  fibrillation, hyperlipidemia, hypertension and basilar artery stenosis  Plan: 1. HgbA1c, fasting lipid panel 2. MRI of the brain without contrast 3. Echocardiogram 4. Carotid dopplers 5. Prophylactic therapy- aspirin and Xarelto 6. Risk factor modification 7. Telemetry monitoring  C.R. Nicole Kindred, MD [facility] [phone]  01/12/2016, 3:40 AM

## 2016-01-12 NOTE — ED Notes (Signed)
[name] daughter requesting to wait on MRI, wants to speak to patient's neurologist prior to rescanning pt.

## 2016-01-12 NOTE — ED Provider Notes (Signed)
CSN: [id number]     Arrival date & time 01/12/16  0201 History   First MD Initiated Contact with Patient 01/12/16 [phone]     Chief Complaint  Patient presents with  . Code Stroke    @ (Consider location/radiation/quality/duration/timing/severity/associated sxs/prior Treatment) HPI Comments: 81 y.o. male history of atrial fibrillation on Xaralto and aspirin, hypertension, hyperlipidemia and previous strokes as well as migraine headaches, presenting with R sided leg numbness. Pt arrived to the ER because he woke up and was having weakness in his legs bilaterally. While in the waiting room, he developed R sided leg numbness. No vision complains, no dizziness. Pt denies nausea, emesis, fevers, chills, chest pains, shortness of breath, headaches, abdominal pain, uti like symptoms.   ROS 10 Systems reviewed and are negative for acute change except as noted in the HPI.     The history is provided by the patient.    Past Medical History  Diagnosis Date  . Hypertension   . Gout   . Dizziness 02/29/2012  . Syncope, near 02/29/2012  . Hx-TIA (transient ischemic attack) June 2008  . Hx of migraines   . Gout   . Hyperlipidemia     statin intolerance  . History of nuclear stress test 03/28/2012    lexiscan myoview; negative for ischemia  . PAF (paroxysmal atrial fibrillation) ([facility])     xarelto  . Headache   . Migraines     classic ophthalmic  . Colon polyps     colonoscopy 2006  . Hyperlipidemia   . Hyperglycemia   . Migraine aura without headache   . CKD (chronic kidney disease), stage III    Past Surgical History  Procedure Laterality Date  . Eye surgery      laser surgery to seal vein   . Transthoracic echocardiogram  03/01/2012    EF 55-60% with normal systolic function; mild AV regurg; calcified mitral annulus - ordered for syncope  . Colonoscopy      2006,11/12   Family History  Problem Relation Age of Onset  . Cancer Maternal Grandmother   . Heart disease  Maternal Grandfather   . Pneumonia Paternal Grandfather    Social History  Substance Use Topics  . Smoking status: Never Smoker   . Smokeless tobacco: Never Used  . Alcohol Use: No    Review of Systems  Eyes: Positive for visual disturbance.      Allergies  Statins  Home Medications   Prior to Admission medications   Medication Sig Start Date End Date Taking? Authorizing Provider  allopurinol (ZYLOPRIM) 300 MG tablet Take 450 mg by mouth daily.   Yes Historical Provider, MD  amLODipine (NORVASC) 5 MG tablet Take 2 tablets (10 mg total) by mouth daily. 01/07/16  Yes Shanker Levora Dredge, MD  aspirin 81 MG chewable tablet Chew 1 tablet (81 mg total) by mouth daily. 01/07/16  Yes Shanker Levora Dredge, MD  divalproex (DEPAKOTE ER) 500 MG 24 hr tablet Take 1 tablet (500 mg total) by mouth daily. 01/07/16  Yes Shanker Levora Dredge, MD  rivaroxaban (XARELTO) 20 MG TABS tablet TAKE 1 TABLET BY MOUTH DAILY WITH SUPPER. 10/29/15  Yes Runell Gess, MD  rosuvastatin (CRESTOR) 20 MG tablet Take 1 tablet (20 mg total) by mouth daily. 01/07/16  Yes Shanker Levora Dredge, MD  telmisartan-hydrochlorothiazide (MICARDIS HCT) 80-25 MG per tablet Take 1 tablet by mouth daily.   Yes Historical Provider, MD  potassium chloride SA (K-DUR,KLOR-CON) 20 MEQ tablet TAKE ONE TABLET BY MOUTH  ONE TIME DAILY Patient not taking: Reported on 01/06/2016 06/05/13   Runell Gess, MD   BP 136/85 mmHg  Pulse 52  Temp(Src) 98.4 F (36.9 C) (Oral)  Resp 13  Ht  (1.803 m)  Wt 219 lb 5.7 oz (99.5 kg)  BMI 30.61 kg/m2  SpO2 94% Physical Exam  Constitutional: He is oriented to person, place, and time. He appears well-developed.  HENT:  Head: Atraumatic.  Neck: Neck supple.  Cardiovascular: Normal rate.   Pulmonary/Chest: Effort normal.  Neurological: He is alert and oriented to person, place, and time. No cranial nerve deficit. Coordination normal.  Cerebellar exam is normal (finger to nose) Sensory exam normal for  bilateral upper and lower extremities - and patient is able to discriminate between sharp and dull. Motor exam is 4+/5 Non specific visual disturbance  Skin: Skin is warm.  Nursing note and vitals reviewed.   ED Course  Procedures (including critical care time) Labs Review Labs Reviewed  COMPREHENSIVE METABOLIC PANEL - Abnormal; Notable for the following:    Sodium 133 (*)    Potassium 3.1 (*)    Chloride 96 (*)    Glucose, Bld 113 (*)    BUN 22 (*)    Creatinine, Ser 1.65 (*)    GFR calc non Af Amer 38 (*)    GFR calc Af Amer 44 (*)    All other components within normal limits  PROTIME-INR - Abnormal; Notable for the following:    Prothrombin Time 28.9 (*)    INR 2.78 (*)    All other components within normal limits  APTT - Abnormal; Notable for the following:    aPTT 44 (*)    All other components within normal limits  URINALYSIS, ROUTINE W REFLEX MICROSCOPIC (NOT AT [facility]) - Abnormal; Notable for the following:    Hgb urine dipstick SMALL (*)    All other components within normal limits  URINE MICROSCOPIC-ADD ON - Abnormal; Notable for the following:    Squamous Epithelial / LPF 0-5 (*)    Bacteria, UA RARE (*)    Casts HYALINE CASTS (*)    All other components within normal limits  I-STAT CHEM 8, ED - Abnormal; Notable for the following:    Potassium 3.1 (*)    Chloride 95 (*)    BUN 24 (*)    Creatinine, Ser 1.50 (*)    Glucose, Bld 114 (*)    Calcium, Ion 1.03 (*)    All other components within normal limits  CBG MONITORING, ED - Abnormal; Notable for the following:    Glucose-Capillary 107 (*)    All other components within normal limits  CBC WITH DIFFERENTIAL/PLATELET  ETHANOL  URINE RAPID DRUG SCREEN, HOSP PERFORMED  I-STAT TROPOININ, ED    Imaging Review Ct Head Code Stroke W/o Cm  01/12/2016  CLINICAL DATA:  Bilateral leg weakness. Progression to right leg numbness. EXAM: CT HEAD WITHOUT CONTRAST TECHNIQUE: Contiguous axial images were obtained from  the base of the skull through the vertex without intravenous contrast. COMPARISON:  Brain MRI from 6 days ago FINDINGS: Skull and Sinuses:Negative for fracture or destructive process. Visualized orbits: Negative. Brain: No evidence of acute infarction, hemorrhage, hydrocephalus, or mass lesion/mass effect. Small occipital cortex infarcts described on comparison brain MRI are not visible by CT. No evidence of infarct extension or hemorrhagic conversion. Chronic microvascular disease with remote left caudate head lacunar infarct. Critical Value/emergent results were called by telephone at the time of interpretation on 01/12/2016 at  3:25 am to Dr. Roseanne RenoStewart, who verbally acknowledged these results. IMPRESSION: 1. No acute finding. 2. Small cortical infarcts seen on brain MRI 6 days prior are not visible. No hemorrhagic conversion or evidence of infarct extension. 3. Chronic microvascular disease. Electronically Signed   By: Marnee SpringJonathon  Watts M.D.   On: 01/12/2016 03:27   I have personally reviewed and evaluated these images and lab results as part of my medical decision-making.   EKG Interpretation None      MDM   Final diagnoses:  Transient cerebral ischemia, unspecified transient cerebral ischemia type  Stroke (cerebrum) (HCC)  Stroke (cerebrum) (HCC)     Pt comes in with bilateral leg weakness and remote hx of non specific complains, but developed focal R leg numbness. Code stroke activated. Symptoms improved. Neuro recommends admission. MRI head and MRA from recent admission reviewed.     Derwood KaplanAnkit Jomes Giraldo, MD 01/12/16 [phone]

## 2016-01-12 NOTE — Progress Notes (Signed)
D/C orders received, pt for D/C to CIR, telemetry D/C. Family at bedside to assist with D/C.  Staff brought pt downstairs to 4W05 in the bed.

## 2016-01-12 NOTE — Progress Notes (Signed)
  Patient seen and examined, admitted by Dr. Clyde LundborgNiu this morning  Briefly Adrian Melendez is a 81 y.o. male with atrial fibrillation on Xarelto, CKDstage III, hypertension, hyperlipidemia, TIA, migraine headache, recent CVA, who presents with right arm numbness and right leg weakness.  Patient was recently hospitalized from 4/20-4/21 and diagnosed with right occipital infarct (suspect is as migrainous infarct, could be related to atherosclerosis). Patient was discharged on aspirin, Crestor and Keppra. Patient presented with weakness in his legs for the past 4 days and right hand tingling and numbness since 1 AM on the day of admission.  In ED, he had one episode of right leg weakness. All symptoms resolved spontaneously. MRI on 01/06/2016 showed multiple subcentimeter and occipital infarctions and MRA showed severe proximal basal artery stenosis with severe left PCA stenosis at the origin.  CVA/TIA with a recent history of CVA - Neurology was consulted and recommended stroke workup, MRI pending - will not repeat 2-D echo or carotid Dopplers, had a recent thorough workup - Continue xarelto, statin, neurology consulted  Hypertension - Hold Micardis, continue amlodipine, permissive hypertension  Atrial fibrillation - CHADS VASC 5, continue xarelto  Mild acute on CKD (chronic kidney disease), stage III: Baseline creatinine 1.35-1.44, his creatinine is 1.65 -Hold Micardis, placed on gentle hydration  HLD: Last LDL was 158/21/17  -Continue home medications: Continue Crestor  Retinal acephalgic migraines:  -Patient was started on Depakote by neurology during previous admission, will continue    Eain Mullendore M.D. [facility] 01/12/2016, 1:21 PM  Pager: [phone]

## 2016-01-12 NOTE — Code Documentation (Addendum)
This  81 y/o white male pt was awakened from his sleep at 0100 with bilateral leg weakness and right arm numbness. His daughter and wife drove him to the ED for evaluation with arrival at 0202. Pt was LSW at 0250 in the emergency room waiting area when he developed Right lower extremity numbness and increased weakness.  He was seen by Dr Kathrynn Humble and cleared for CT at 0255 with arrival in CT at 0305.  COde stroke was called at 0306, with arrival of Rapid Response at 0313.  Pt was returned to ED A6  With resolved symptoms and was met by Dr Nicole Kindred at [phone].   NIHSS scored a 0. CBG 103  CT result of no acute findings was called to Dr Nicole Kindred at [phone].  Pt will be admitted by Hospitalist

## 2016-01-12 NOTE — ED Notes (Signed)
Pt. Reported sudden onset right lower leg numbness while at waiting area  ( 0250) , seen by Dr. Rhunette CroftNanavati and activated code stroke , transported to CT scan ( 0305).

## 2016-01-12 NOTE — ED Notes (Signed)
Pt. reports intermittent bilateral legs weakness " feels like rubbery" and tingling at right hand  onset Saturday and again this morning . Speech clear with no facial asymmetry , equal grips with no arm drift .

## 2016-01-12 NOTE — ED Notes (Signed)
[facility] called @ 0305/code stroke activated.

## 2016-01-12 NOTE — H&P (Signed)
History and Physical    Adrian Melendez [id number] DOB: 12/18/1934 DOA: 01/12/2016  Referring MD/NP/PA:   PCP: Garlan FillersPATERSON,DANIEL G, MD   Outpatient Specialists: none  Patient coming from:  Home  Chief Complaint: right arm numbness and right leg weakness  HPI: Adrian Melendez is a 81 y.o. male with medical history significant of atrial fibrillation on Xarelto, chronic kidney disease-stage III, hypertension, hyperlipidemia, TIA, migraine headache, recently diagnosed a stroke, who presents with right arm numbness and right leg weakness.  Patient was recently hospitalized from 4/20-4/21 and diagnosed with right occipital infarct. Patient was discharged on aspirin, Crestor and Keppra. He reports that in the past 4 days, he has weakness in both legs. He states that he had right hand tingling and rubbery feeling at about 1:00 AM. In ED, he had one episode of right leg weakness. All symptoms have resolved spontaneously. On note, MRI on 01/06/2016 showed multiple subcentimeter and occipital infarctions and MRA showed severe proximal basal artery stenosis with severe left PCA stenosis at the origin. Patient does not have chest pain, shortness breath, cough, nausea, vomiting, abdominal pain, symptoms of UTI. No vision change or hearing loss. No slurred speech.  ED Course: pt was found to have negative CT head for new acute intracranial abnormalities. INR 2.78, WBC 8.3, temperature normal, tachycardia, potassium 3.1, slightly worsening renal function, UDS negative, negative urinalysis. Patient is admitted to inpatient for further eval and treatment and observation. Neurology, Dr. Roseanne RenoStewart was consulted. Review of Systems:   General: no fevers, chills, no changes in body weight, has fatigue HEENT: no blurry vision, hearing changes or sore throat Pulm: no dyspnea, coughing, wheezing CV: no chest pain, no palpitations Abd: no nausea, vomiting, abdominal pain, diarrhea, constipation GU: no dysuria,  burning on urination, increased urinary frequency, hematuria  Ext: no leg edema Neuro: has right leg weakness and right hand numbness. No vision change or hearing loss Skin: no rash MSK: No muscle spasm, no deformity, no limitation of range of movement in spin Heme: No easy bruising.  Travel history: No recent long distant travel.  Allergy:  Allergies  Allergen Reactions  . Statins     Cause leg weakness     Past Medical History  Diagnosis Date  . Hypertension   . Gout   . Dizziness 02/29/2012  . Syncope, near 02/29/2012  . Hx-TIA (transient ischemic attack) June 2008  . Hx of migraines   . Gout   . Hyperlipidemia     statin intolerance  . History of nuclear stress test 03/28/2012    lexiscan myoview; negative for ischemia  . PAF (paroxysmal atrial fibrillation) (HCC)     xarelto  . Headache   . Migraines     classic ophthalmic  . Colon polyps     colonoscopy 2006  . Hyperlipidemia   . Hyperglycemia   . Migraine aura without headache   . CKD (chronic kidney disease), stage III     Past Surgical History  Procedure Laterality Date  . Eye surgery      laser surgery to seal vein   . Transthoracic echocardiogram  03/01/2012    EF 55-60% with normal systolic function; mild AV regurg; calcified mitral annulus - ordered for syncope  . Colonoscopy      2006,11/12    Social History:  reports that he has never smoked. He has never used smokeless tobacco. He reports that he does not drink alcohol or use illicit drugs.  Family History:  Family History  Problem  Relation Age of Onset  . Cancer Maternal Grandmother   . Heart disease Maternal Grandfather   . Pneumonia Paternal Grandfather      Prior to Admission medications   Medication Sig Start Date End Date Taking? Authorizing Provider  allopurinol (ZYLOPRIM) 300 MG tablet Take 450 mg by mouth daily.   Yes Historical Provider, MD  amLODipine (NORVASC) 5 MG tablet Take 2 tablets (10 mg total) by mouth daily. 01/07/16  Yes  Shanker Levora Dredge, MD  aspirin 81 MG chewable tablet Chew 1 tablet (81 mg total) by mouth daily. 01/07/16  Yes Shanker Levora Dredge, MD  divalproex (DEPAKOTE ER) 500 MG 24 hr tablet Take 1 tablet (500 mg total) by mouth daily. 01/07/16  Yes Shanker Levora Dredge, MD  rivaroxaban (XARELTO) 20 MG TABS tablet TAKE 1 TABLET BY MOUTH DAILY WITH SUPPER. 10/29/15  Yes Runell Gess, MD  rosuvastatin (CRESTOR) 20 MG tablet Take 1 tablet (20 mg total) by mouth daily. 01/07/16  Yes Shanker Levora Dredge, MD  telmisartan-hydrochlorothiazide (MICARDIS HCT) 80-25 MG per tablet Take 1 tablet by mouth daily.   Yes Historical Provider, MD  potassium chloride SA (K-DUR,KLOR-CON) 20 MEQ tablet TAKE ONE TABLET BY MOUTH ONE TIME DAILY Patient not taking: Reported on 01/06/2016 06/05/13   Runell Gess, MD    Physical Exam: Filed Vitals:   01/12/16 1610 01/12/16 0445 01/12/16 0515 01/12/16 0545  BP:  128/81 119/77 136/85  Pulse:  56 47 52  Temp:      TempSrc:      Resp:  Height:      Weight: 99.5 kg (219 lb 5.7 oz)     SpO2:  96% 96% 94%   General: Not in acute distress HEENT:       Eyes: PERRL, EOMI, no scleral icterus.       ENT: No discharge from the ears and nose, no pharynx injection, no tonsillar enlargement.        Neck: No JVD, no bruit, no mass felt. Heme: No neck lymph node enlargement. Cardiac: S1/S2, RRR, No murmurs, No gallops or rubs. Pulm:  No rales, wheezing, rhonchi or rubs. Abd: Soft, nondistended, nontender, no rebound pain, no organomegaly, BS present. GU: No hematuria Ext: No pitting leg edema bilaterally. 2+DP/PT pulse bilaterally. Musculoskeletal: No joint deformities, No joint redness or warmth, no limitation of ROM in spin. Skin: No rashes.  Neuro: Alert, oriented X3, cranial nerves II-XII grossly intact, moves all extremities normally. Muscle strength 5/5 in all extremities, sensation to light touch intact. Brachial reflex 2+ bilaterally. Negative Babinski's sign. Normal finger  to nose test. Psych: Patient is not psychotic, no suicidal or hemocidal ideation.  Labs on Admission: I have personally reviewed following labs and imaging studies  CBC:  Recent Labs Lab 01/05/16 2250 01/12/16 0220 01/12/16 0320  WBC 7.8 8.3  --   NEUTROABS 5.0 4.6  --   HGB 14.6 14.6 16.0  HCT 42.7 42.6 47.0  MCV 91.2 89.5  --   PLT 237 250  --    Basic Metabolic Panel:  Recent Labs Lab 01/05/16 2250 01/07/16 0258 01/12/16 0220 01/12/16 0320  NA 140 138 133* 135  K 3.4* 3.8 3.1* 3.1*  CL 102 102 96* 95*  CO2 --   GLUCOSE 133* 105* 113* 114*  BUN 18 24* 22* 24*  CREATININE 1.35* 1.44* 1.65* 1.50*  CALCIUM 9.3 9.0 8.9  --    GFR: Estimated Creatinine Clearance: 48.8 mL/min (  by C-G formula based on Cr of 1.5). Liver Function Tests:  Recent Labs Lab 01/05/16 2250 01/12/16 0220  AST 23 26  ALT 17 20  ALKPHOS 96 98  BILITOT 0.8 0.6  PROT 7.4 7.0  ALBUMIN 4.1 3.9   No results for input(s): LIPASE, AMYLASE in the last 168 hours. No results for input(s): AMMONIA in the last 168 hours. Coagulation Profile:  Recent Labs Lab 01/12/16 0312  INR 2.78*   Cardiac Enzymes: No results for input(s): CKTOTAL, CKMB, CKMBINDEX, TROPONINI in the last 168 hours. BNP (last 3 results) No results for input(s): PROBNP in the last 8760 hours. HbA1C: No results for input(s): HGBA1C in the last 72 hours. CBG:  Recent Labs Lab 01/12/16 0333  GLUCAP 107*   Lipid Profile: No results for input(s): CHOL, HDL, LDLCALC, TRIG, CHOLHDL, LDLDIRECT in the last 72 hours. Thyroid Function Tests: No results for input(s): TSH, T4TOTAL, FREET4, T3FREE, THYROIDAB in the last 72 hours. Anemia Panel: No results for input(s): VITAMINB12, FOLATE, FERRITIN, TIBC, IRON, RETICCTPCT in the last 72 hours. Urine analysis:    Component Value Date/Time   COLORURINE YELLOW 01/12/2016 0411   APPEARANCEUR CLEAR 01/12/2016 0411   LABSPEC 1.014 01/12/2016 0411   PHURINE 6.0 01/12/2016  0411   GLUCOSEU NEGATIVE 01/12/2016 0411   HGBUR SMALL* 01/12/2016 0411   BILIRUBINUR NEGATIVE 01/12/2016 0411   KETONESUR NEGATIVE 01/12/2016 0411   PROTEINUR NEGATIVE 01/12/2016 0411   UROBILINOGEN 0.2 03/04/2007 0857   NITRITE NEGATIVE 01/12/2016 0411   LEUKOCYTESUR NEGATIVE 01/12/2016 0411   Sepsis Labs: @LABRCNTIP (procalcitonin:4,lacticidven:4) )No results found for this or any previous visit (from the past 240 hour(s)).   Radiological Exams on Admission: Ct Head Code Stroke W/o Cm  01/12/2016  CLINICAL DATA:  Bilateral leg weakness. Progression to right leg numbness. EXAM: CT HEAD WITHOUT CONTRAST TECHNIQUE: Contiguous axial images were obtained from the base of the skull through the vertex without intravenous contrast. COMPARISON:  Brain MRI from 6 days ago FINDINGS: Skull and Sinuses:Negative for fracture or destructive process. Visualized orbits: Negative. Brain: No evidence of acute infarction, hemorrhage, hydrocephalus, or mass lesion/mass effect. Small occipital cortex infarcts described on comparison brain MRI are not visible by CT. No evidence of infarct extension or hemorrhagic conversion. Chronic microvascular disease with remote left caudate head lacunar infarct. Critical Value/emergent results were called by telephone at the time of interpretation on 01/12/2016 at 3:25 am to Dr. Roseanne Reno, who verbally acknowledged these results. IMPRESSION: 1. No acute finding. 2. Small cortical infarcts seen on brain MRI 6 days prior are not visible. No hemorrhagic conversion or evidence of infarct extension. 3. Chronic microvascular disease. Electronically Signed   By: Marnee Spring M.D.   On: 01/12/2016 03:27     EKG: Independently reviewed. QTC 447, atrial fibrillation, anteroseptal infarction pattern  Assessment/Plan Principal Problem:   Stroke (cerebrum) (HCC) Active Problems:   HTN (hypertension)   Hx-TIA (transient ischemic attack) June 2008   PAF (paroxysmal atrial fibrillation)  by Holter June 2012   Chronic anticoagulation- Xarelto   CKD (chronic kidney disease), stage III   HLD (hyperlipidemia)   Hypokalemia   TIA (transient ischemic attack)   Stroke: Patient symptoms are concerning for new stroke. Neurology was consulted. Dr. Roseanne Reno saw patient, recommended stroke workup.  -will admit to tele bed for observation -Aappreciate Dr. Roseanne Reno recommendations as follows  1. HgbA1c, fasting lipid panel-->pt had A1c 6.3 and LDL 158 on 01/07/16, will not repeat  2. MRI of the brain without contrast  3.  Echocardiogram  4. Carotid dopplers  5. Prophylactic therapy- aspirin and Xarelto  6. Risk factor modification  7. Telemetry monitoring  HTN (hypertension): -hold Micardis due to worsening renal function -Continue amlodipine  Atrial Fibrillation: CHA2DS2-VASc Score is 5, needs oral anticoagulation. 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 Heart rate is well controlled. -continue Xaelto -tele monitoing  CKD (chronic kidney disease), stage III: Baseline creatinine 1.35-1.44, his creatinine is 1.65, which is slightly worsened and in the baseline pre- -Hold Micardis -Normal saline 75 mL per hour -Follow-up renal function by BMP  HLD: Last LDL was 158/21/17  -Continue home medications: Continue Crestor  Hypokalemia: K= 3.1 on admission. - Repleted - Check Mg level  DVT ppx: on Xarelto Code Status: Full code Family Communication: Yes, patient's wife and daughter at bed side Disposition Plan:  Anticipate discharge back to previous home environment Consults called:  Neuro, Dr. Roseanne Reno Admission status: Obs / tele  Date of Service 01/12/2016    Lorretta Harp [facility] Pager [phone]  If 7PM-7AM, please contact night-coverage [email or web address] Password [facility] 01/12/2016, 6:39 AM

## 2016-01-12 NOTE — Progress Notes (Signed)
STROKE TEAM PROGRESS NOTE   HISTORY OF PRESENT ILLNESS Mellody DrownWilliam R Laser is an 81 y.o. male history of atrial fibrillation on Xarelto and aspirin, hypertension, hyperlipidemia and previous strokes as well as migraine headaches, presenting with transient numbness involving his right arm which woke him up around 1 AM. Symptoms subsequently resolved. While waiting in the emergency room at 3 AM he developed numbness involving his distal right lower extremity (LKW 01/12/2016 at 0300). Code stroke was activated. Deficits resolved after several minutes. CT scan of his head showed no acute intracranial abnormality. He's been experiencing intermittent lower extremity sensory symptoms over the past week. He had an MRI of his brain on 01/06/2016 which showed multiple subcentimeter and occipital infarctions. MRA showed severe proximal basal artery stenosis with severe left PCA stenosis at the origin. 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 Right PICA remains patent. NIH stroke score at the time of this evaluation was 0. Patient was not administered IV t-PA secondary to deficits resolved. He was admitted for further evaluation and treatment.   SUBJECTIVE (INTERVAL HISTORY) His wife and daughter are at the bedside.  Overall he feels his condition is stable. Dr. Roda ShuttersXu recounted HPI with patient, wife and daughter. He recently has a lot of lightheadedness especially on standing and sitting. Saturday afternoon he felt lightheadedness and also right face, arm and leg numbness for 1-2 min. Sunday morning when he was brushing teeth, both leg weakness and almost fall, lasting 3-4 min. This early morning 1am he got up to bathroom and again felt both leg weakness lasting 3-4 min. He sat down and it got better. In ER his SBP 106. He at home check his BP most time in lying position, no low BP. Will repeat MRI today and check orthostatic  vitals   OBJECTIVE Temp:  [97.6 F (36.4 C)-98.4 F (36.9 C)] 97.7 F (36.5 C) (04/26 0956) Pulse Rate:  [47-109] 51 (04/26 0956) Cardiac Rhythm:  [-] Atrial fibrillation (04/26 0853) Resp:  [12-20] 16 (04/26 0956) BP: (106-181)/(67-85) 150/85 mmHg (04/26 0956) SpO2:  [94 %-99 %] 98 % (04/26 0956) Weight:  [99.5 kg (219 lb 5.7 oz)] 99.5 kg (219 lb 5.7 oz) (04/26 0429)  CBC:   Recent Labs Lab 01/05/16 2250 01/12/16 0220 01/12/16 0320  WBC 7.8 8.3  --   NEUTROABS 5.0 4.6  --   HGB 14.6 14.6 16.0  HCT 42.7 42.6 47.0  MCV 91.2 89.5  --   PLT 237 250  --     Basic Metabolic Panel:   Recent Labs Lab 01/07/16 0258 01/12/16 0220 01/12/16 0320  NA 138 133* 135  K 3.8 3.1* 3.1*  CL 102 96* 95*  CO2 24 22  --   GLUCOSE 105* 113* 114*  BUN 24* 22* 24*  CREATININE 1.44* 1.65* 1.50*  CALCIUM 9.0 8.9  --     Lipid Panel:     Component Value Date/Time   CHOL 216* 01/07/2016 0258   TRIG 135 01/07/2016 0258   HDL 31* 01/07/2016 0258   CHOLHDL 7.0 01/07/2016 0258   VLDL 27 01/07/2016 0258   LDLCALC 158* 01/07/2016 0258   HgbA1c:  Lab Results  Component Value Date   HGBA1C 6.3* 01/07/2016   Urine Drug Screen:     Component Value Date/Time   LABOPIA NONE DETECTED 01/12/2016 0411   COCAINSCRNUR NONE DETECTED 01/12/2016 0411   LABBENZ NONE DETECTED 01/12/2016 0411   AMPHETMU NONE  DETECTED 01/12/2016 0411   THCU NONE DETECTED 01/12/2016 0411   LABBARB NONE DETECTED 01/12/2016 0411      IMAGING  Ct Head Code Stroke W/o Cm 01/12/2016   1. No acute finding. 2. Small cortical infarcts seen on brain MRI 6 days prior are not visible. No hemorrhagic conversion or evidence of infarct extension. 3. Chronic microvascular disease.   [name]  01/12/2016   IMPRESSION: New punctate foci of acute infarction in the left temporal lobe and right posterior parietal lobe. These are again consistent with micro embolic infarctions. No swelling, mass effect or acute  hemorrhage. Extensive old small vessel insults elsewhere throughout the brain, some associated with hemosiderin deposition.   CTA head and neck - pending  Orthostatic vital - lying 122/64, sitting 122/65, standing 98/55, standing 119/67   PHYSICAL EXAM  Temp:  [97.5 F (36.4 C)-98.4 F (36.9 C)] 97.5 F (36.4 C) (04/26 1421) Pulse Rate:  [47-109] 56 (04/26 1421) Resp:  [12-20] 16 (04/26 1421) BP: (106-181)/(67-88) 151/86 mmHg (04/26 1421) SpO2:  [94 %-99 %] 99 % (04/26 1421) Weight:  [219 lb 5.7 oz (99.5 kg)] 219 lb 5.7 oz (99.5 kg) (04/26 0429)  General - Well nourished, well developed, in no apparent distress.  Ophthalmologic - Fundi not visualized due to eye movement.  Cardiovascular - Regular rate and rhythm with no murmur.  Mental Status -  Level of arousal and orientation to time, place, and person were intact. Language including expression, naming, repetition, comprehension was assessed and found intact. Fund of Knowledge was assessed and was intact.  Cranial Nerves II - XII - II - Visual field intact OU. III, IV, VI - Extraocular movements intact. V - Facial sensation intact bilaterally. VII - Facial movement intact bilaterally. VIII - Hearing & vestibular intact bilaterally. X - Palate elevates symmetrically. XI - Chin turning & shoulder shrug intact bilaterally. XII - Tongue protrusion intact.  Motor Strength - The patient's strength was normal in all extremities and pronator drift was absent.  Bulk was normal and fasciculations were absent.   Motor Tone - Muscle tone was assessed at the neck and appendages and was normal.  Reflexes - The patient's reflexes were 1+ in all extremities and he had no pathological reflexes.  Sensory - Light touch, temperature/pinprick were assessed and were symmetrical.    Coordination - The patient had normal movements in the hands and feet with no ataxia or dysmetria.  Tremor was absent.  Gait and Station - not  tested due to safety concerns.   ASSESSMENT/PLAN Mr. MOODY ROBBEN is a 81 y.o. male with history of atrial fibrillation on xarelto, retinal migraines and right occipital embolic infarcts d/t afib vs migrainous infarct vs symptomatic BA stenosis last week who presents now with transient right upper extremity and right lower extremity numbness. He did not receive IV t-PA due to resolved deficits.   Stroke: acute punctate right PCA and left MCA/PCA territory infarcts, new from last admission, likely symptomatic BA and PCA stenosis in setting of hypotension  MRI  Punctate right PCA and left MCA/PCA territory infarcts   CTA head and neck pending  CUS - unremarkable  TTE - EF 60-65%  LDL 158  A1C 6.3  xarelto for VTE prophylaxis Diet Heart Room service appropriate?: Yes; Fluid consistency:: Thin  aspirin 81 mg daily and Xarelto (rivaroxaban) daily prior to admission, now on aspirin 81 mg daily and Xarelto (rivaroxaban) daily   Pt and family would think about  medical management vs. BA stent  Therapy recommendations:  No therapy needs  Disposition:  pending   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  Correlating with posterior infarcts  Pt needs better BP control  BP goal 130-160  Keep hydration  Medical management vs. BA stent - family considering  Orthostatic hypotension  Orthostatic vital indicating orthostatic hypotension  Recommend hydration  Slow raising up   Head of bed 30 degree  Compression socks  BP meds management  BP currently stable, home meds on hold now  orthostatic vital Qshift  Atrial Fibrillation  Home anticoagulation:  Xarelto (rivaroxaban) daily   Continue  at discharge  Hyperlipidemia  Home meds:  crestor 20 mg daily - started last week in setting of previous Intolerance to multiple statins in the past  LDL 158 last admission  Continue statin at discharge  Migraines with aura  Discharge  on depakote last week  Still has visual aura but improving  Other Stroke Risk Factors  Advanced age  Obesity, Body mass index is 30.61 kg/(m^2).   Hx stroke/TIA  12/2015 Right occipital embolic infarcts d/t afib vs migrainous infarct vs symptomatic BA stenosis   Other Active Problems  CKD stage III  Hypokalemia, 3.1 on [facility] day #   Rhoderick Moody [facility] See Amion for Pager information 01/12/2016 12:05 PM   I, the attending vascular neurologist, have personally obtained a history, examined the patient, evaluated laboratory data, individually viewed imaging studies and agree with radiology interpretations. I obtained additional history from pt's daughter and wife at bedside. Together with the NP/PA, we formulated the assessment and plan of care which reflects our mutual decision.  I have made any additions or clarifications directly to the above note and agree with the findings and plan as currently documented.   MRI showed new punctate infarcts at right PCA and left MCA/PCA territory, still consistent with posterior poor circulation. History leaning towards orthostatic hypotension and orthostatic vitals support the notion. Family deciding on medical management vs. Stenting. Hold off BP meds now. Orthostatic vital Qshift.  Marvel Plan, MD PhD Stroke Neurology 01/12/2016 6:36 PM       To contact Stroke Continuity provider, please refer to [email or web address]. After hours, contact General Neurology

## 2016-01-13 DIAGNOSIS — I951 Orthostatic hypotension: Secondary | ICD-10-CM | POA: Insufficient documentation

## 2016-01-13 DIAGNOSIS — Z7901 Long term (current) use of anticoagulants: Secondary | ICD-10-CM

## 2016-01-13 DIAGNOSIS — I10 Essential (primary) hypertension: Secondary | ICD-10-CM

## 2016-01-13 DIAGNOSIS — I639 Cerebral infarction, unspecified: Principal | ICD-10-CM

## 2016-01-13 DIAGNOSIS — I481 Persistent atrial fibrillation: Secondary | ICD-10-CM

## 2016-01-13 LAB — CBC
HEMATOCRIT: 39.8 % (ref 39.0–52.0)
HEMOGLOBIN: 13.6 g/dL (ref 13.0–17.0)
MCH: 30.6 pg (ref 26.0–34.0)
MCHC: 34.2 g/dL (ref 30.0–36.0)
MCV: 89.4 fL (ref 78.0–100.0)
Platelets: 215 10*3/uL (ref 150–400)
RBC: 4.45 MIL/uL (ref 4.22–5.81)
RDW: 13.2 % (ref 11.5–15.5)
WBC: 7.6 10*3/uL (ref 4.0–10.5)

## 2016-01-13 LAB — BASIC METABOLIC PANEL
ANION GAP: 13 (ref 5–15)
BUN: 25 mg/dL — ABNORMAL HIGH (ref 6–20)
CHLORIDE: 101 mmol/L (ref 101–111)
CO2: 21 mmol/L — AB (ref 22–32)
Calcium: 8.6 mg/dL — ABNORMAL LOW (ref 8.9–10.3)
Creatinine, Ser: 1.34 mg/dL — ABNORMAL HIGH (ref 0.61–1.24)
GFR calc non Af Amer: 49 mL/min — ABNORMAL LOW (ref >60)
GFR, EST AFRICAN AMERICAN: 57 mL/min — AB (ref >60)
Glucose, Bld: 97 mg/dL (ref 65–99)
Potassium: 3.3 mmol/L — ABNORMAL LOW (ref 3.5–5.1)
Sodium: 135 mmol/L (ref 135–145)

## 2016-01-13 LAB — FOLATE: Folate: 20 ng/mL (ref >5.9)

## 2016-01-13 LAB — MAGNESIUM: Magnesium: 1.9 mg/dL (ref 1.7–2.4)

## 2016-01-13 LAB — TSH: TSH: 3.397 u[IU]/mL (ref 0.350–4.500)

## 2016-01-13 LAB — CORTISOL: Cortisol, Plasma: 14.8 ug/dL

## 2016-01-13 LAB — VITAMIN B12: Vitamin B-12: 296 pg/mL (ref 180–914)

## 2016-01-13 MED ORDER — AMLODIPINE BESYLATE 5 MG PO TABS
5.0000 mg | ORAL_TABLET | Freq: Every day | ORAL | Status: DC
  Administered 2016-01-13: 5 mg via ORAL
  Filled 2016-01-13: qty 1

## 2016-01-13 MED ORDER — POTASSIUM CHLORIDE CRYS ER 20 MEQ PO TBCR
40.0000 meq | EXTENDED_RELEASE_TABLET | Freq: Once | ORAL | Status: AC
  Administered 2016-01-13: 40 meq via ORAL
  Filled 2016-01-13: qty 2

## 2016-01-13 NOTE — Progress Notes (Signed)
PT Cancellation Note  Patient Details Name: Adrian DrownWilliam R Melendez MRN: [id number] DOB: 12/17/1934   Cancelled Treatment:    Reason Eval/Treat Not Completed: Patient not medically ready Pt on active bedrest. Will await increase in activity orders prior to initiation of PT eval.   Clarine Elrod A Talina Pleitez 01/13/2016, 7:53 AM Mylo RedShauna Chanler Schreiter, PT, DPT [phone]

## 2016-01-13 NOTE — Progress Notes (Signed)
Initially assessment completed. Family present to bedside. Spoke with Dr. Lance CoonLaura Harduk about previous shift episode of right sided numbness reported by the patient prior to 1900 shift. Patient now reports that it has resolved and has not happened again. Jacinto ReapLatoya Jago Carton RN informed the patient to call and notify if there are any more episodes of right arm numbness during 1900 shift. Patient verbalized understanding. Call bell in reach. Will continue to monitor.

## 2016-01-13 NOTE — Progress Notes (Signed)
OT Cancellation Note  Patient Details Name: Adrian DrownWilliam R Melendez MRN: [id number] DOB: 11/11/1934   Cancelled Treatment:    Reason Eval/Treat Not Completed: Fatigue/lethargy limiting ability to participate. Pt asleep upon arrival; daughter requesting OT check back later. Will follow up for OT eval as time allows.  Gaye AlkenBailey A Eban Weick M.S., OTR/L Pager: [phone]  01/13/2016, 11:10 AM

## 2016-01-13 NOTE — Discharge Instructions (Signed)

## 2016-01-13 NOTE — Progress Notes (Signed)
STROKE TEAM PROGRESS NOTE   SUBJECTIVE (INTERVAL HISTORY) His wife is on the phone and daughter is at the bedside.  Overall he feels his condition is stable. Had orthostatic vital checking concerning for orthostatic hypotension. MRI also showed new punctate posterior circulation infarcts, consistent with severe posterior circulation vasculopathy in the setting of low BP. CTA head and neck unremarkable.    OBJECTIVE Temp:  [97.5 F (36.4 C)-98 F (36.7 C)] 98 F (36.7 C) (04/27 2137) Pulse Rate:  [49-95] 95 (04/27 2137) Cardiac Rhythm:  [-] Atrial fibrillation (04/27 1900) Resp:  [17-20] 18 (04/27 2137) BP: (94-146)/(51-88) 146/59 mmHg (04/27 2137) SpO2:  [94 %-98 %] 94 % (04/27 2137)  CBC:   Recent Labs Lab 01/12/16 0220 01/12/16 0320 01/13/16 0510  WBC 8.3  --  7.6  NEUTROABS 4.6  --   --   HGB 14.6 16.0 13.6  HCT 42.6 47.0 39.8  MCV 89.5  --  89.4  PLT 250  --  215    Basic Metabolic Panel:   Recent Labs Lab 01/12/16 0220 01/12/16 0320 01/13/16 0510  NA 133* 135 135  K 3.1* 3.1* 3.3*  CL 96* 95* 101  CO2 22  --  21*  GLUCOSE 113* 114* 97  BUN 22* 24* 25*  CREATININE 1.65* 1.50* 1.34*  CALCIUM 8.9  --  8.6*  MG  --   --  1.9    Lipid Panel:     Component Value Date/Time   CHOL 216* 01/07/2016 0258   TRIG 135 01/07/2016 0258   HDL 31* 01/07/2016 0258   CHOLHDL 7.0 01/07/2016 0258   VLDL 27 01/07/2016 0258   LDLCALC 158* 01/07/2016 0258   HgbA1c:  Lab Results  Component Value Date   HGBA1C 6.3* 01/07/2016   Urine Drug Screen:     Component Value Date/Time   LABOPIA NONE DETECTED 01/12/2016 0411   COCAINSCRNUR NONE DETECTED 01/12/2016 0411   LABBENZ NONE DETECTED 01/12/2016 0411   AMPHETMU NONE DETECTED 01/12/2016 0411   THCU NONE DETECTED 01/12/2016 0411   LABBARB NONE DETECTED 01/12/2016 0411      IMAGING I have personally reviewed the radiological images below and agree with the radiology interpretations.  Ct Head Code Stroke W/o  Cm 01/12/2016   1. No acute finding. 2. Small cortical infarcts seen on brain MRI 6 days prior are not visible. No hemorrhagic conversion or evidence of infarct extension. 3. Chronic microvascular disease.   [name]  01/12/2016   IMPRESSION: New punctate foci of acute infarction in the left temporal lobe and right posterior parietal lobe. These are again consistent with micro embolic infarctions. No swelling, mass effect or acute hemorrhage. Extensive old small vessel insults elsewhere throughout the brain, some associated with hemosiderin deposition.   Ct Angio Head and Neck W/cm &/or Wo/cm  01/12/2016  IMPRESSION: CT HEAD: Known acute LEFT temporoparietal lobe infarcts not apparent by CT. Chronic changes including old LEFT basal ganglia lacunar infarct and moderate chronic small vessel ischemic disease. 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 Atherosclerosis without hemodynamically significant stenosis of the carotid arteries. CTA HEAD: Stable angiographic appearance of the brain: Chronically occluded distal RIGHT V4 segment. High-grade stenosis proximal basilar artery. Tandem high-grade stenoses LEFT posterior cerebral artery. Moderate stenosis RIGHT P2 segment. Moderate stenosis RIGHT M1 segment.    PHYSICAL EXAM  Temp:  [97.5 F (36.4 C)-98 F (36.7 C)] 98 F (36.7 C) (04/27 2137) Pulse Rate:  [49-95]  95 (04/27 2137) Resp:  [17-20] 18 (04/27 2137) BP: (94-146)/(51-88) 146/59 mmHg (04/27 2137) SpO2:  [94 %-98 %] 94 % (04/27 2137)  General - Well nourished, well developed, in no apparent distress.  Ophthalmologic - Fundi not visualized due to eye movement.  Cardiovascular - irregularly irregular heart rate and rhythm.  Mental Status -  Level of arousal and orientation to time, place, and person were intact. Language including expression, naming, repetition, comprehension was assessed and found intact. Fund of  Knowledge was assessed and was intact.  Cranial Nerves II - XII - II - Visual field intact OU. III, IV, VI - Extraocular movements intact. V - Facial sensation intact bilaterally. VII - Facial movement intact bilaterally. VIII - Hearing & vestibular intact bilaterally. X - Palate elevates symmetrically. XI - Chin turning & shoulder shrug intact bilaterally. XII - Tongue protrusion intact.  Motor Strength - The patient's strength was normal in all extremities and pronator drift was absent.  Bulk was normal and fasciculations were absent.   Motor Tone - Muscle tone was assessed at the neck and appendages and was normal.  Reflexes - The patient's reflexes were 1+ in all extremities and he had no pathological reflexes.  Sensory - Light touch, temperature/pinprick were assessed and were symmetrical.    Coordination - The patient had normal movements in the hands and feet with no ataxia or dysmetria.  Tremor was absent.  Gait and Station - not tested due to safety concerns.   ASSESSMENT/PLAN [name] is a 81 y.o. male with history of atrial fibrillation on xarelto, retinal migraines and right occipital embolic infarcts d/t afib vs migrainous infarct vs symptomatic BA stenosis last week who presents now with transient right upper extremity and right lower extremity numbness. He did not receive IV t-PA due to resolved deficits.   Stroke: acute punctate right PCA and left MCA/PCA territory infarcts, new from last admission, likely symptomatic BA and PCA stenosis in setting of orthostatic hypotension  MRI  Punctate right PCA and left MCA/PCA territory infarcts   CTA head and neck unremarkable  CUS - unremarkable  TTE - EF 60-65%  LDL 158  A1C 6.3  xarelto for VTE prophylaxis Diet Heart Room service appropriate?: Yes; Fluid consistency:: Thin  aspirin 81 mg daily and Xarelto (rivaroxaban) daily prior to admission, now on aspirin 81 mg daily and Xarelto (rivaroxaban)  daily   Therapy recommendations:  No therapy needs  Disposition:  pending   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  Correlating with posterior infarcts  Pt needs better BP control  BP goal 130-160  Keep hydration  Consider stenting if recurrent stroke with better BP control  Orthostatic hypotension  Orthostatic vital indicating orthostatic hypotension  Recommend hydration  Slow raising up   Head of bed 30 degree  Compression socks - ordered  Resume home amlodipine at 5mg  daily and d/c other home BP meds   Check BP at home and record, goal 130-160  Atrial Fibrillation  Home anticoagulation:  Xarelto (rivaroxaban) daily   Continue at discharge  Bradycardia  Cardiology on board  No need PPM  Follow up with Dr. Allyson SabalBerry  Hyperlipidemia  Home meds:  crestor 20 mg daily - started last week in setting of previous Intolerance to multiple statins in the past  LDL 158 last admission  Continue statin at discharge  Migraines with aura  Discharge on depakote last week  Still has visual aura but improving  Other Stroke Risk Factors  Advanced age  Obesity, Body mass index is 30.61 kg/(m^2).   Hx stroke/TIA  12/2015 Right occipital embolic infarcts d/t afib vs migrainous infarct vs symptomatic BA stenosis   Other Active Problems  CKD stage III  Hypokalemia, 3.1 on [facility] day # 2  Neurology will sign off. Please call with questions. Pt will follow up with Dr. Pearlean Brownie at [facility] on 03/20/16. Thanks for the consult.   Marvel Plan, MD PhD Stroke Neurology 01/13/2016 11:56 PM   To contact Stroke Continuity provider, please refer to [email or web address]. After hours, contact General Neurology

## 2016-01-13 NOTE — Progress Notes (Signed)
[facility]                                                                              Patient Demographics  Adrian Melendez, is a 81 y.o. male, DOB - Feb 27, 1935, EAV:[id number]  Admit date - 01/12/2016   Admitting Physician Lorretta Harp, MD  Outpatient Primary MD for the patient is Garlan Fillers, MD  Outpatient specialists: Neurology, Dr. Frances Furbish                                          Cardiology, Dr. Allyson Sabal   LOS - 1  days    Chief Complaint  Patient presents with  . Code Stroke       Brief summary   Briefly Adrian Melendez is a 81 y.o. male with atrial fibrillation on Xarelto, CKDstage III, hypertension, hyperlipidemia, TIA, migraine headache, recent CVA, who presents with right arm numbness and right leg weakness.  Patient was recently hospitalized from 4/20-4/21 and diagnosed with right occipital infarct (suspect is as migrainous infarct, could be related to atherosclerosis). Patient was discharged on aspirin, Crestor and Keppra. Patient presented with weakness in his legs for the past 4 days and right hand tingling and numbness since 1 AM on the day of admission. In ED, he had one episode of right leg weakness. All symptoms resolved spontaneously. MRI on 01/06/2016 showed multiple subcentimeter and occipital infarctions and MRA showed severe proximal basal artery stenosis with severe left PCA stenosis at the origin.   Assessment & Plan   CVA/TIA with a recent history of CVA - Neurology was consulted and recommended stroke workup,  - MRI showed new punctate foci of acute infarction in the left temporal lobe and right posterior parietal lobe consistent with microembolic infarctions - will not repeat 2-D echo or carotid Dopplers, had a recent thorough workup - Continue xarelto, statin, neurology following  Hypotension with history of Hypertension - Continue to hold Micardis, amlodipine  - Orthostatic, likely due to posterior circulation basilar  arteries stenosis, continue IV fluids, thigh high ted hoses - Also check a cortisol level, B12, folate  Atrial fibrillation with Bradycardia heart rate persisting in 30s to 40s intermittently - Not on any rate controlling medications, discontinued amlodipine also - Obtain cortisol level, TSH, EKG, no heart blocks on the previous EKG yesterday - 2-D echo 4/21 had shown EF of 60-65% with grade 1 diastolic dysfunction, no aortic stenosis - CHADS VASC 5, continue xarelto  Mild acute on CKD (chronic kidney disease), stage III: Baseline creatinine 1.35-1.44, his creatinine is 1.65 -Creatinine function improving, Hold Micardis, placed on gentle hydration  HLD: Last LDL was 158/21/17  -Continue home medications: Continue Crestor  Retinal acephalgic migraines:  -Patient was started on Depakote by neurology during previous admission, will continue   Hypokalemia - Replaced  Code Status: Full code  Family Communication: Discussed in detail with the patient, all imaging results, lab results explained to the patient    Disposition Plan:   Time Spent in minutes   25 minutes  Procedures  MRI brain  Consults   Cardiology Neuro  DVT Prophylaxis scd's   Medications  Scheduled Meds: .  stroke: mapping our early stages of recovery book   Does not apply Once  . allopurinol  450 mg Oral Daily  . aspirin  81 mg Oral Daily  . divalproex  500 mg Oral Daily  . rivaroxaban  20 mg Oral Q supper  . rosuvastatin  20 mg Oral Daily   Continuous Infusions: . sodium chloride 75 mL/hr at 01/12/16 0626   PRN Meds:.senna-docusate   Antibiotics   Anti-infectives    None        Subjective:   Adrian Melendez was seen and examined today.  Patient denies dizziness, chest pain, shortness of breath, abdominal pain, N/V/D/C, new weakness, numbess, tingling. No acute events overnight.    Objective:   Filed Vitals:   01/12/16 2124 01/13/16 0135 01/13/16 0513 01/13/16 1012  BP: 111/57  127/66 138/68 94/51  Pulse: 58 51 49 67  Temp: 98.1 F (36.7 C) 97.7 F (36.5 C) 97.7 F (36.5 C) 98 F (36.7 C)  TempSrc: Oral Oral Oral Oral  Resp: 18 18 20 18   Height:      Weight:      SpO2: 98% 96% 97% 98%   No intake or output data in the 24 hours ending 01/13/16 1211   Wt Readings from Last 3 Encounters:  01/12/16 99.5 kg (219 lb 5.7 oz)  01/06/16 99.338 kg (219 lb)  11/25/15 102.059 kg (225 lb)     Exam  General: Alert and oriented x 3, NAD  HEENT:    Neck:   CVS: S1 S2 auscultated, no rubs, murmurs or gallops. Regular rate and rhythm.  Respiratory: CTAB no wheezing  Abdomen: Soft, nontender, nondistended, + bowel sounds  Ext: no cyanosis clubbing or edema  Neuro: No new deficits  Skin: No rashes  Psych: Normal affect and demeanor, alert and oriented x3    Data Reviewed:  I have personally reviewed following labs and imaging studies  Micro Results No results found for this or any previous visit (from the past 240 hour(s)).  Radiology Reports Ct Angio Head W/cm &/or Wo Cm  01/12/2016  CLINICAL DATA:  Follow-up code stroke. Bilateral leg weakness, RIGHT hand tingling. History of RIGHT occipital lobe infarct, hypertension, dizziness, migraines. EXAM: CT ANGIOGRAPHY HEAD AND NECK TECHNIQUE: Multidetector CT imaging of the head and neck was performed using the standard protocol during bolus administration of intravenous contrast. Multiplanar CT image reconstructions and MIPs were obtained to evaluate the vascular anatomy. Carotid stenosis measurements (when applicable) are obtained utilizing NASCET criteria, using the distal internal carotid diameter as the denominator. CONTRAST:  50 cc Isovue 370 COMPARISON:  MRI head January 12, 2016 at 1330 hours and MRA head January 06, 2016 FINDINGS: CT HEAD: INTRACRANIAL CONTENTS: No intraparenchymal hemorrhage, mass effect nor midline shift. Patchy supratentorial white matter hypodensities are within normal range for  patient's age and though non-specific likely represent chronic small vessel ischemic disease. No acute large vascular territory infarcts. Old LEFT basal ganglia lacunar infarct with mild ex vacuo dilatation frontal horn of LEFT lateral ventricle. No abnormal extra-axial fluid collections. Basal cisterns are patent. Moderate calcific atherosclerosis of the carotid siphons. No abnormal intracranial enhancement. ORBITS: The included ocular globes and orbital contents are non-suspicious. SINUSES: Small RIGHT maxillary mucosal retention cyst. Small LEFT frontal sinus mucosal retention cyst. Mastoid air cells are well aerated. SKULL/SOFT TISSUES: No skull fracture. No significant soft tissue swelling. CTA NECK  Aortic arch: Normal appearance of the thoracic arch, normal branch pattern. Mild calcific atherosclerosis of the aortic arch. The origins of the innominate, left Common carotid artery and subclavian artery are widely patent. Irregular intimal thickening LEFT subclavian artery origin. Right carotid system: Common carotid artery is widely patent, coursing in a straight line fashion. Normal appearance of the carotid bifurcation without hemodynamically significant stenosis by NASCET criteria. Mild eccentric calcific atherosclerosis. Normal appearance of the included internal carotid artery. Left carotid system: Common carotid artery is widely patent, coursing in a straight line fashion. Mild intimal thickening calcific atherosclerosis. Normal appearance of the carotid bifurcation without hemodynamically significant stenosis by NASCET criteria. Mild intimal thickening and calcific atherosclerosis. Normal appearance of the included internal carotid artery. Vertebral arteries:LEFT vertebral artery is dominant. Severe luminal irregularity of the RIGHT vertebral artery throughout the course, patent through RIGHT V3 segment. Skeleton: No acute osseous process though bone windows have not been submitted. Bulky ventral cervical  spine osteophytes compatible with DISH deform the posterior pharynx. Other neck: Soft tissues of the neck are non-acute though, not tailored for evaluation. CTA HEAD Anterior circulation: Patent internal carotid arteries bilaterally. Severe stenosis RIGHT supraclinoid internal carotid artery due to calcific atherosclerosis, mild stenosis LEFT carotid siphon. Normal appearance of the anterior cerebral arteries with patent anterior communicating artery. Moderate stenosis RIGHT M1 segment. Mild luminal irregularity of the remaining vessels compatible with atherosclerosis. Posterior circulation: Faint contrast opacification of RIGHT V4 segment proximally, occluded distal RIGHT V4 segment. Mild luminal irregularity due to calcific atherosclerosis the LEFT vertebral artery. Focal severe stenosis proximal basilar artery with mild poststenotic dilatation. Moderate stenosis distal basilar artery. Tandem severe stenosis LEFT posterior cerebral artery including P1 and P2 segments. Moderate stenosis of RIGHT P2 segment. No large vessel occlusion, contrast extravasation or aneurysm within the anterior nor posterior circulation. IMPRESSION: CT HEAD: Known acute LEFT temporoparietal lobe infarcts not apparent by CT. Chronic changes including old LEFT basal ganglia lacunar infarct and moderate chronic small vessel ischemic disease. 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 Atherosclerosis without hemodynamically significant stenosis of the carotid arteries. CTA HEAD: Stable angiographic appearance of the brain: Chronically occluded distal RIGHT V4 segment. High-grade stenosis proximal basilar artery. Tandem high-grade stenoses LEFT posterior cerebral artery. Moderate stenosis RIGHT P2 segment. Moderate stenosis RIGHT M1 segment. Electronically Signed   By: Awilda Metro M.D.   On: 01/12/2016 23:03   Ct Angio Neck W/cm &/or Wo/cm  01/12/2016  CLINICAL DATA:  Follow-up  code stroke. Bilateral leg weakness, RIGHT hand tingling. History of RIGHT occipital lobe infarct, hypertension, dizziness, migraines. EXAM: CT ANGIOGRAPHY HEAD AND NECK TECHNIQUE: Multidetector CT imaging of the head and neck was performed using the standard protocol during bolus administration of intravenous contrast. Multiplanar CT image reconstructions and MIPs were obtained to evaluate the vascular anatomy. Carotid stenosis measurements (when applicable) are obtained utilizing NASCET criteria, using the distal internal carotid diameter as the denominator. CONTRAST:  50 cc Isovue 370 COMPARISON:  MRI head January 12, 2016 at 1330 hours and MRA head January 06, 2016 FINDINGS: CT HEAD: INTRACRANIAL CONTENTS: No intraparenchymal hemorrhage, mass effect nor midline shift. Patchy supratentorial white matter hypodensities are within normal range for patient's age and though non-specific likely represent chronic small vessel ischemic disease. No acute large vascular territory infarcts. Old LEFT basal ganglia lacunar infarct with mild ex vacuo dilatation frontal horn of LEFT lateral ventricle. No abnormal extra-axial fluid collections. Basal cisterns are patent. Moderate calcific atherosclerosis of  the carotid siphons. No abnormal intracranial enhancement. ORBITS: The included ocular globes and orbital contents are non-suspicious. SINUSES: Small RIGHT maxillary mucosal retention cyst. Small LEFT frontal sinus mucosal retention cyst. Mastoid air cells are well aerated. SKULL/SOFT TISSUES: No skull fracture. No significant soft tissue swelling. CTA NECK Aortic arch: Normal appearance of the thoracic arch, normal branch pattern. Mild calcific atherosclerosis of the aortic arch. The origins of the innominate, left Common carotid artery and subclavian artery are widely patent. Irregular intimal thickening LEFT subclavian artery origin. Right carotid system: Common carotid artery is widely patent, coursing in a straight line  fashion. Normal appearance of the carotid bifurcation without hemodynamically significant stenosis by NASCET criteria. Mild eccentric calcific atherosclerosis. Normal appearance of the included internal carotid artery. Left carotid system: Common carotid artery is widely patent, coursing in a straight line fashion. Mild intimal thickening calcific atherosclerosis. Normal appearance of the carotid bifurcation without hemodynamically significant stenosis by NASCET criteria. Mild intimal thickening and calcific atherosclerosis. Normal appearance of the included internal carotid artery. Vertebral arteries:LEFT vertebral artery is dominant. Severe luminal irregularity of the RIGHT vertebral artery throughout the course, patent through RIGHT V3 segment. Skeleton: No acute osseous process though bone windows have not been submitted. Bulky ventral cervical spine osteophytes compatible with DISH deform the posterior pharynx. Other neck: Soft tissues of the neck are non-acute though, not tailored for evaluation. CTA HEAD Anterior circulation: Patent internal carotid arteries bilaterally. Severe stenosis RIGHT supraclinoid internal carotid artery due to calcific atherosclerosis, mild stenosis LEFT carotid siphon. Normal appearance of the anterior cerebral arteries with patent anterior communicating artery. Moderate stenosis RIGHT M1 segment. Mild luminal irregularity of the remaining vessels compatible with atherosclerosis. Posterior circulation: Faint contrast opacification of RIGHT V4 segment proximally, occluded distal RIGHT V4 segment. Mild luminal irregularity due to calcific atherosclerosis the LEFT vertebral artery. Focal severe stenosis proximal basilar artery with mild poststenotic dilatation. Moderate stenosis distal basilar artery. Tandem severe stenosis LEFT posterior cerebral artery including P1 and P2 segments. Moderate stenosis of RIGHT P2 segment. No large vessel occlusion, contrast extravasation or aneurysm  within the anterior nor posterior circulation. IMPRESSION: CT HEAD: Known acute LEFT temporoparietal lobe infarcts not apparent by CT. Chronic changes including old LEFT basal ganglia lacunar infarct and moderate chronic small vessel ischemic disease. 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 Atherosclerosis without hemodynamically significant stenosis of the carotid arteries. CTA HEAD: Stable angiographic appearance of the brain: Chronically occluded distal RIGHT V4 segment. High-grade stenosis proximal basilar artery. Tandem high-grade stenoses LEFT posterior cerebral artery. Moderate stenosis RIGHT P2 segment. Moderate stenosis RIGHT M1 segment. Electronically Signed   By: Awilda Metro M.D.   On: 01/12/2016 23:03   Mr [name] Wo Contrast  01/06/2016  CLINICAL DATA:  81 year old male with worsening visual disturbance. Multiple punctate infarcts in the right occipital lobe on MRI. Abnormal distal right vertebral artery on MRI. Initial encounter. EXAM: MRA HEAD WITHOUT CONTRAST TECHNIQUE: Angiographic images of the Circle of Willis were obtained using MRA technique without intravenous contrast. COMPARISON:  Brain MRI 0303 hours today. Brain MRI and MRA 03/04/2007. FINDINGS: The left vertebral artery is chronically dominant. Since 2008, the right vertebral artery has occluded just distal to the right PICA origin. The right PICA remains patent (series 305, image 14). No stenosis of the distal left vertebral artery which supplies the basilar. The left PICA remains patent. Irregularity in the proximal third of the basilar artery has progressed and there is now  moderate to severe stenosis (series 305, image 14). The basilar remains patent. Mild to moderate distal basilar irregularity persists, but without stenosis. Both SCA origins remain patent. The right PCA origin is stable. Right PCA branches have not significantly changed. There is a new severe  stenosis at the left PCA origin (series 305, image 14). The left PCA remains patent. There is superimposed new moderate irregularity and stenosis in the left PCA P2 segment. Preserved distal left PCA flow. Posterior communicating arteries are diminutive or absent. Stable antegrade flow in both ICA siphons. Both carotid termini remain patent. No siphon stenosis identified. Normal ophthalmic artery origins. ACA origins remain normal. There is new mild stenosis at both MCA origins. Visualized bilateral MCA branches and visualized bilateral ACA branches appear stable and within normal limits. IMPRESSION: 1. The Right PCA is within normal limits. 2. Severe proximal Basilar artery stenosis, and severe Left PCA origin stenosis have developed since 2008. 3. 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 Right PICA remains patent. The left vertebral artery is chronically dominant. 4. The anterior circulation is more stable since 2008, with only mild bilateral MCA origin stenoses. Electronically Signed   By: Odessa FlemingH  Hall M.D.   On: 01/06/2016 07:33   [name]  01/12/2016  CLINICAL DATA:  Atrial fibrillation. Migraine headache. Recent stroke and TIA. Right arm numbness and right leg weakness. EXAM: MRI HEAD WITHOUT CONTRAST TECHNIQUE: Multiplanar, multiecho pulse sequences of the brain and surrounding structures were obtained without intravenous contrast. COMPARISON:  Head CT same day.  MRI 01/06/2016. FINDINGS: Few small punctate foci of acute infarctions seen on the study of 6 days ago of largely lost their restricted diffusion. However, there are several new punctate foci of acute infarction, most prominently clustered in the left temporal lobe but also seen in the right posterior parietal lobe. These are again consistent with micro embolic infarctions. No large vessel territory infarction. No mass lesion, hemorrhage, hydrocephalus or extra-axial collection. Chronic  small-vessel ischemic changes present elsewhere throughout the brain. Old left basal ganglia infarction. Old focus of hemosiderin deposition associated with a few of the old small vessel infarctions. No pituitary mass. No inflammatory sinus disease. No skull or skullbase lesion. Major vessels at the base of the brain show flow. IMPRESSION: New punctate foci of acute infarction in the left temporal lobe and right posterior parietal lobe. These are again consistent with micro embolic infarctions. No swelling, mass effect or acute hemorrhage. Extensive old small vessel insults elsewhere throughout the brain, some associated with hemosiderin deposition. Electronically Signed   By: Paulina FusiMark  Shogry M.D.   On: 01/12/2016 15:52   [name]  01/06/2016  CLINICAL DATA:  Worsening intermittent visual disturbances for 6 days. Intermittent head fullness, nasal congestion and tinnitus. History of migraines, hypertension, hyperlipidemia. EXAM: MRI HEAD WITHOUT CONTRAST TECHNIQUE: Multiplanar, multiecho pulse sequences of the brain and surrounding structures were obtained without intravenous contrast. COMPARISON:  CT head February 29, 2012 and MRI of the head March 04, 2007 FINDINGS: INTRACRANIAL CONTENTS: At least 3 foci of reduced diffusion RIGHT occipital lobe measure up to 4 mm, difficult to localize on ADC map due to small size. Small foci of susceptibility artifact RIGHT insula, LEFT mesial thalamus. The ventricles and sulci are normal for patient's age. Patchy to confluent supratentorial white matter FLAIR T2 hyperintensities. Old small RIGHT cerebellar, LEFT pontine infarcts. Old bilateral basal ganglia lacunar infarcts. No suspicious parenchymal signal, mass lesions, mass effect.  No abnormal extra-axial fluid collections. No extra-axial masses though, contrast enhanced sequences would be more sensitive. Focal loss of RIGHT V4 flow void. dolicoectatic intracranial vessels. ORBITS: The included ocular globes and  orbital contents are non-suspicious. SINUSES: Small RIGHT maxillary mucosal retention cysts, small LEFT frontal sinus mucosal retention cyst. The mastoid air-cells and included paranasal sinuses are otherwise well-aerated. SKULL/SOFT TISSUES: No abnormal sellar expansion. No suspicious calvarial bone marrow signal. Craniocervical junction maintained. IMPRESSION: At least 3 subcentimeter foci of probable acute ischemia RIGHT occipital lobe. Focal loss of RIGHT vertebral artery V4 flow void concerning for focal occlusion. Moderate to severe chronic small vessel ischemic disease. Old small RIGHT cerebellar and LEFT pontine infarcts. Old bilateral basal ganglia lacunar infarcts. Electronically Signed   By: Awilda Metro M.D.   On: 01/06/2016 03:53   Ct Head Code Stroke W/o Cm  01/12/2016  CLINICAL DATA:  Bilateral leg weakness. Progression to right leg numbness. EXAM: CT HEAD WITHOUT CONTRAST TECHNIQUE: Contiguous axial images were obtained from the base of the skull through the vertex without intravenous contrast. COMPARISON:  Brain MRI from 6 days ago FINDINGS: Skull and Sinuses:Negative for fracture or destructive process. Visualized orbits: Negative. Brain: No evidence of acute infarction, hemorrhage, hydrocephalus, or mass lesion/mass effect. Small occipital cortex infarcts described on comparison brain MRI are not visible by CT. No evidence of infarct extension or hemorrhagic conversion. Chronic microvascular disease with remote left caudate head lacunar infarct. Critical Value/emergent results were called by telephone at the time of interpretation on 01/12/2016 at 3:25 am to Dr. Roseanne Reno, who verbally acknowledged these results. IMPRESSION: 1. No acute finding. 2. Small cortical infarcts seen on brain MRI 6 days prior are not visible. No hemorrhagic conversion or evidence of infarct extension. 3. Chronic microvascular disease. Electronically Signed   By: Marnee Spring M.D.   On: 01/12/2016 03:27     CBC  Recent Labs Lab 01/12/16 0220 01/12/16 0320 01/13/16 0510  WBC 8.3  --  7.6  HGB 14.6 16.0 13.6  HCT 42.6 47.0 39.8  PLT 250  --  215  MCV 89.5  --  89.4  MCH 30.7  --  30.6  MCHC 34.3  --  34.2  RDW 13.2  --  13.2  LYMPHSABS 2.7  --   --   MONOABS 0.7  --   --   EOSABS 0.3  --   --   BASOSABS 0.0  --   --     Chemistries   Recent Labs Lab 01/07/16 0258 01/12/16 0220 01/12/16 0320 01/13/16 0510  NA 138 133* 135 135  K 3.8 3.1* 3.1* 3.3*  CL 102 96* 95* 101  CO2 24 22  --  21*  GLUCOSE 105* 113* 114* 97  BUN 24* 22* 24* 25*  CREATININE 1.44* 1.65* 1.50* 1.34*  CALCIUM 9.0 8.9  --  8.6*  MG  --   --   --  1.9  AST  --  26  --   --   ALT  --  20  --   --   ALKPHOS  --  98  --   --   BILITOT  --  0.6  --   --    ------------------------------------------------------------------------------------------------------------------ estimated creatinine clearance is 54.6 mL/min (by C-G formula based on Cr of 1.34). ------------------------------------------------------------------------------------------------------------------ No results for input(s): HGBA1C in the last 72 hours. ------------------------------------------------------------------------------------------------------------------ No results for input(s): CHOL, HDL, LDLCALC, TRIG, CHOLHDL, LDLDIRECT in the last 72 hours. ------------------------------------------------------------------------------------------------------------------ No results for input(s): TSH, T4TOTAL,  T3FREE, THYROIDAB in the last 72 hours.  Invalid input(s): FREET3 ------------------------------------------------------------------------------------------------------------------ No results for input(s): VITAMINB12, FOLATE, FERRITIN, TIBC, IRON, RETICCTPCT in the last 72 hours.  Coagulation profile  Recent Labs Lab 01/12/16 0312  INR 2.78*    No results for input(s): DDIMER in the last 72 hours.  Cardiac Enzymes No  results for input(s): CKMB, TROPONINI, MYOGLOBIN in the last 168 hours.  Invalid input(s): CK ------------------------------------------------------------------------------------------------------------------ Invalid input(s): POCBNP   Recent Labs  01/12/16 0333 01/12/16 2127  GLUCAP 107* 135*     RAI,RIPUDEEP M.D. [facility] 01/13/2016, 12:11 PM  Pager: [phone] Between 7am to 7pm - call Pager - [phone]  After 7pm go to [email or web address] - password [email or web address]  Call night coverage person covering after 7pm

## 2016-01-13 NOTE — Evaluation (Signed)
Occupational Therapy Evaluation Patient Details Name: Adrian Melendez MRN: [id number] DOB: 02-23-1935 Today's Date: 01/13/2016    History of Present Illness Patient is a 81 y/o male with hx of recent CVA (admitted 4/20-4/21 with occipital infarct) , gout, TIA, HTN, HLD, A-fib and near syncope presents with right sided weakness/numbness. MRI- New punctate foci of acute infarction in the left temporal lobe and right posterior parietal lobe.    Clinical Impression   Pt reports he was independent with ADLs and mobility PTA. Currently pt overall min guard for safety with ADLs and functional mobility. Pt initially supervision but with fatigue required min guard. Pt planning to d/c home with 24/7 supervision from family. Pt reports visual deficits have resolved. Pt and family declining HH follow up therapy at this time. Pt would benefit from continued skilled OT to address established goals.    Follow Up Recommendations  No OT follow up;Supervision/Assistance - 24 hour    Equipment Recommendations  None recommended by OT    Recommendations for Other Services       Precautions / Restrictions Precautions Precautions: Fall Restrictions Weight Bearing Restrictions: No      Mobility Bed Mobility Overal bed mobility: Modified Independent             General bed mobility comments: HOB flat no use of rail.  Transfers Overall transfer level: Needs assistance Equipment used: None Transfers: Sit to/from Stand Sit to Stand: Supervision         General transfer comment: Supervision for safety. No dizziness with sit to stand.    Balance Overall balance assessment: Needs assistance Sitting-balance support: Feet supported;No upper extremity supported Sitting balance-Leahy Scale: Good     Standing balance support: No upper extremity supported;During functional activity Standing balance-Leahy Scale: Fair                              ADL Overall ADL's : Needs  assistance/impaired                                     Functional mobility during ADLs: Min guard General ADL Comments: Pt currently overall min guard assist for safety with ADLs and functional mobility. Pt began with supervision for functional mobility but with fatigue increased assist level to min guard for safety.     Vision Vision Assessment?: Yes Eye Alignment: Within Functional Limits Ocular Range of Motion: Within Functional Limits Tracking/Visual Pursuits: Able to track stimulus in all quads without difficulty Visual Fields: No apparent deficits   Perception     Praxis      Pertinent Vitals/Pain Pain Assessment: No/denies pain     Hand Dominance Right   Extremity/Trunk Assessment Upper Extremity Assessment Upper Extremity Assessment: RUE deficits/detail RUE Deficits / Details: grossly 4/5 strength.  RUE Coordination: decreased gross motor   Lower Extremity Assessment Lower Extremity Assessment: Defer to PT evaluation   Cervical / Trunk Assessment Cervical / Trunk Assessment: Kyphotic   Communication Communication Communication: No difficulties   Cognition Arousal/Alertness: Awake/alert Behavior During Therapy: WFL for tasks assessed/performed Overall Cognitive Status: Within Functional Limits for tasks assessed                     General Comments       Exercises       Shoulder Instructions      Home Living Family/patient expects  to be discharged to:: Private residence Living Arrangements: Spouse/significant other Available Help at Discharge: Family;Available 24 hours/day Type of Home: House Home Access: Stairs to enter Entergy CorporationEntrance Stairs-Number of Steps: 4 Entrance Stairs-Rails: Left;Right;Can reach both Home Layout: One level     Bathroom Shower/Tub: Producer, television/film/videoWalk-in shower   Bathroom Toilet: Standard     Home Equipment: Environmental consultantWalker - 2 wheels;Cane - single point;Wheelchair - manual;Shower seat          Prior  Functioning/Environment Level of Independence: Independent        Comments: Enjoys spending time on his computer    OT Diagnosis: Generalized weakness;Disturbance of vision   OT Problem List: Impaired balance (sitting and/or standing);Decreased activity tolerance;Decreased coordination;Decreased safety awareness;Decreased knowledge of use of DME or AE;Decreased knowledge of precautions   OT Treatment/Interventions: Self-care/ADL training;Therapeutic exercise;Energy conservation;DME and/or AE instruction;Therapeutic activities;Visual/perceptual remediation/compensation;Patient/family education;Balance training    OT Goals(Current goals can be found in the care plan section) Acute Rehab OT Goals Patient Stated Goal: to get some rest OT Goal Formulation: With patient Time For Goal Achievement: 01/27/16 Potential to Achieve Goals: Good ADL Goals Pt Will Perform Grooming: with modified independence;standing Pt Will Perform Upper Body Bathing: with modified independence;sitting Pt Will Perform Lower Body Bathing: with modified independence;sit to/from stand Pt Will Transfer to Toilet: with modified independence;ambulating;regular height toilet Pt Will Perform Toileting - Clothing Manipulation and hygiene: with modified independence;sit to/from stand Pt Will Perform Tub/Shower Transfer: Shower transfer;with modified independence;ambulating;shower seat  OT Frequency: Min 2X/week   Barriers to D/C:            Co-evaluation              End of Session Equipment Utilized During Treatment: Gait belt  Activity Tolerance: Patient tolerated treatment well Patient left: in bed;with call bell/phone within reach;with bed alarm set;with family/visitor present   Time: 1356-1415 OT Time Calculation (min): 19 min Charges:  OT General Charges $OT Visit: 1 Procedure OT Evaluation $OT Eval Moderate Complexity: 1 Procedure G-Codes:     Gaye AlkenBailey A Jadan Rouillard M.S., OTR/L Pager: [phone]   01/13/2016, 3:48 PM

## 2016-01-13 NOTE — Progress Notes (Signed)
OT Cancellation Note  Patient Details Name: Adrian Melendez MRN: [id number] DOB: 06/01/1935   Cancelled Treatment:    Reason Eval/Treat Not Completed: Medical issues which prohibited therapy (Pt with active bedrest orders). Will await d/c of bedrest orders and updated activity orders to initiate OT eval.  Gaye AlkenBailey A Nazifa Trinka M.S., OTR/L Pager: [phone]  01/13/2016, 8:17 AM

## 2016-01-13 NOTE — Care Management Note (Signed)
Case Management Note  Patient Details  Name: Adrian Melendez MRN: [id number] Date of Birth: [birth year]  Subjective/Objective:                    Action/Plan: CM met with patient and family to discuss home health. Patient and family are in agreement that they are not interested in home health services at this time. CM encouraged patient to contact his PCP should any home health needs arise after discharge.  No further needs identified at this time. Bedside RN updated.  Expected Discharge Date:  01/14/16               Expected Discharge Plan:  Home/Self Care  In-House Referral:     Discharge planning Services  CM Consult  Post Acute Care Choice:  Home Health Choice offered to:     DME Arranged:    DME Agency:     HH Arranged:    [place] Agency:     Status of Service:  Completed, signed off  Medicare Important Message Given:    Date Medicare IM Given:    Medicare IM give by:    Date Additional Medicare IM Given:    Additional Medicare Important Message give by:     If discussed at [place] of Stay Meetings, dates discussed:    Additional Comments:  Rolm Baptise, RN 01/13/2016, 1:10 PM [phone]

## 2016-01-13 NOTE — Evaluation (Signed)
Physical Therapy Evaluation Patient Details Name: Adrian Melendez MRN: [id number] DOB: [birth year] Today's Date: 01/13/2016   History of Present Illness  Patient is a 81 y/o male with hx of recent CVA (admitted 4/20-4/21 with occipital infarct) , gout, TIA, HTN, HLD, A-fib and near syncope presents with right sided weakness/numbness. MRI- New punctate foci of acute infarction in the left temporal lobe and right posterior parietal lobe.   Clinical Impression  Patient presents with mild LLE weakness, reported auras affecting vision/balance and overall weakness impacting mobility. Tolerated gait training and stair training with Min guard assist however towards end of ambulation bout BLEs became weak and fatigued resulting in RLE slowly buckling. Pt reports feeling tired as he did not get much sleep last night. Will follow acutely to maximize independence and mobility prior to return home.    Follow Up Recommendations Home health PT;Supervision - Intermittent    Equipment Recommendations  None recommended by PT    Recommendations for Other Services       Precautions / Restrictions Precautions Precautions: Fall Restrictions Weight Bearing Restrictions: No      Mobility  Bed Mobility Overal bed mobility: Modified Independent             General bed mobility comments: HOB elevated, use of rail.  Transfers Overall transfer level: Modified independent Equipment used: None             General transfer comment: Stood from EOB x2, reports some dizziness resulting in need to sit.   Ambulation/Gait Ambulation/Gait assistance: Min guard Ambulation Distance (Feet): 200 Feet Assistive device: None Gait Pattern/deviations: Step-through pattern;Decreased stride length;Narrow base of support;Antalgic Gait velocity: decreased   General Gait Details: Antalgic gait bilaterally with increased knee flexion esp towards end of ambulation. Decreased muscular endurance RLE as evidenced by  slow knee flexion/buckling towards end of ambulation bout needing to hold onto rail for support.   Stairs Stairs: Yes Stairs assistance: Supervision Stair Management: Two rails;Alternating pattern Number of Stairs: 2 (+ 3 steps x2 bouts) General stair comments: Impulsive, use of rails for support. Cues for technique.  Wheelchair Mobility    Modified Rankin (Stroke Patients Only) Modified Rankin (Stroke Patients Only) Pre-Morbid Rankin Score: No significant disability Modified Rankin: Moderately severe disability     Balance Overall balance assessment: Needs assistance Sitting-balance support: Feet supported;No upper extremity supported Sitting balance-Leahy Scale: Good     Standing balance support: During functional activity Standing balance-Leahy Scale: Fair Standing balance comment: Able to stand unsupported.               High Level Balance Comments: Daughter present in room.             Pertinent Vitals/Pain Pain Assessment: No/denies pain    Home Living Family/patient expects to be discharged to:: Private residence Living Arrangements: Spouse/significant other Available Help at Discharge: Family;Available 24 hours/day Type of Home: House Home Access: Stairs to enter Entrance Stairs-Rails: Left;Right;Can reach both Entrance Stairs-Number of Steps: 4 Home Layout: One level Home Equipment: Walker - 2 wheels;Cane - single point;Wheelchair - manual      Prior Function Level of Independence: Independent         Comments: Enjoys spending time on his computer     Hand Dominance   Dominant Hand: Right    Extremity/Trunk Assessment   Upper Extremity Assessment: Defer to OT evaluation           Lower Extremity Assessment: RLE deficits/detail RLE Deficits / Details: Grossly ~4/5 with MMT,  however functionally weaker during ambulation.    Cervical / Trunk Assessment: Kyphotic  Communication   Communication: No difficulties  Cognition  Arousal/Alertness: Awake/alert Behavior During Therapy: WFL for tasks assessed/performed Overall Cognitive Status: Within Functional Limits for tasks assessed                      General Comments      Exercises        Assessment/Plan    PT Assessment Patient needs continued PT services  PT Diagnosis Difficulty walking;Generalized weakness   PT Problem List Decreased strength;Decreased mobility;Decreased safety awareness;Decreased balance;Decreased activity tolerance  PT Treatment Interventions Balance training;Gait training;Functional mobility training;Therapeutic activities;Therapeutic exercise;Patient/family education;Stair training;Neuromuscular re-education;DME instruction   PT Goals (Current goals can be found in the Care Plan section) Acute Rehab PT Goals Patient Stated Goal: to get some rest PT Goal Formulation: With patient Time For Goal Achievement: 01/27/16 Potential to Achieve Goals: Good    Frequency Min 4X/week   Barriers to discharge        Co-evaluation               End of Session Equipment Utilized During Treatment: Gait belt Activity Tolerance: Patient limited by fatigue Patient left: in bed;with family/visitor present;with call bell/phone within reach Nurse Communication: Mobility status         Time: [id number] PT Time Calculation (min) (ACUTE ONLY): 18 min   Charges:   PT Evaluation $PT Eval Moderate Complexity: 1 Procedure     PT G Codes:        Meric Joye A Ace Bergfeld 01/13/2016, 11:17 AM Mylo RedShauna Erian Rosengren, PT, DPT [phone]

## 2016-01-13 NOTE — Consult Note (Signed)
CARDIOLOGY CONSULT NOTE   Patient ID: Adrian Melendez MRN: [id number] DOB/AGE: 1935-02-09 81 y.o.  Admit date: 01/12/2016  Requesting Physician: Dr. Isidoro Donning Primary Physician:   Garlan Fillers, MD Primary Cardiologist:   Dr. Allyson Sabal Reason for Consultation:  Bradycardia.   HPI: Adrian Melendez is a 81 y.o. male with a history of PAF on Xarelto, HTN, HLD, obesity, OSA, CKD stage III, and previous CVA who presented to [facility] on 01/12/16 with right sided weakness. Cardiology consulted for afib with slow ventricular rate bradycardia with pauses.   He had an episode of presyncope requiring hospitalization back in June 2013. A 2D echo was normal, as was a head CT. A monitor showed PVCs. He was begun on a low dose beta blocker. Myoview in 03/2012 was nonischemic. However, event monitor did show PAF with fairly long pauses which was felt to explain his symptoms. He was started on Xarelto and beta blocker was discontinued.  He was last seen by Dr. Allyson Sabal in 11/2014 for general cardiology follow up. Felt to be doing well from a cardiac perspective. Maintaining NSR.  Patient was recently hospitalized from 4/20-4/21 and diagnosed with right occipital infarct (suspect is as migrainous infarct, ?related to atherosclerosis). Patient was discharged on aspirin, Crestor and Keppra. Patient represented on 01/12/16 with weakness in his legs for the past 4 days and right hand tingling and numbness since 1 AM on the day of admission. In ED, he had one episode of right leg weakness. All symptoms resolved spontaneously. MRI on 01/06/2016 showed multiple subcentimeter and occipital infarctions and MRA showed severe proximal basal artery stenosis with severe left PCA stenosis at the origin.  2D ECHO 01/07/16 with normal LVEF, G1DD, mildly dilated LAE/RAE. No significant valvular abnormalities.   The patient was noted to be in atrial fibrillation with frequent pauses up to 2.7 second pauses. He has a long history of  this.  He is totally asymptomatic with no pre syncope or syncope. He denies CP or SOB. He denies LE edema, orthopnea or PND.    Past Medical History  Diagnosis Date  . Hypertension   . Gout   . Dizziness 02/29/2012  . Syncope, near 02/29/2012  . Hx-TIA (transient ischemic attack) June 2008  . Hx of migraines   . Gout   . Hyperlipidemia     statin intolerance  . History of nuclear stress test 03/28/2012    lexiscan myoview; negative for ischemia  . PAF (paroxysmal atrial fibrillation) (HCC)     xarelto  . Headache   . Migraines     classic ophthalmic  . Colon polyps     colonoscopy 2006  . Hyperlipidemia   . Hyperglycemia   . Migraine aura without headache   . CKD (chronic kidney disease), stage III      Past Surgical History  Procedure Laterality Date  . Eye surgery      laser surgery to seal vein   . Transthoracic echocardiogram  03/01/2012    EF 55-60% with normal systolic function; mild AV regurg; calcified mitral annulus - ordered for syncope  . Colonoscopy      2006,11/12    Allergies  Allergen Reactions  . Statins     Cause leg weakness     I have reviewed the patient's current medications .  stroke: mapping our early stages of recovery book   Does not apply Once  . allopurinol  450 mg Oral Daily  . aspirin  81 mg Oral Daily  .  divalproex  500 mg Oral Daily  . rivaroxaban  20 mg Oral Q supper  . rosuvastatin  20 mg Oral Daily   . sodium chloride 75 mL/hr at 01/12/16 2956   senna-docusate  Prior to Admission medications   Medication Sig Start Date End Date Taking? Authorizing Provider  allopurinol (ZYLOPRIM) 300 MG tablet Take 450 mg by mouth daily.   Yes Historical Provider, MD  amLODipine (NORVASC) 5 MG tablet Take 2 tablets (10 mg total) by mouth daily. 01/07/16  Yes Shanker Levora Dredge, MD  aspirin 81 MG chewable tablet Chew 1 tablet (81 mg total) by mouth daily. 01/07/16  Yes Shanker Levora Dredge, MD  divalproex (DEPAKOTE ER) 500 MG 24 hr tablet Take 1  tablet (500 mg total) by mouth daily. 01/07/16  Yes Shanker Levora Dredge, MD  rivaroxaban (XARELTO) 20 MG TABS tablet TAKE 1 TABLET BY MOUTH DAILY WITH SUPPER. 10/29/15  Yes Runell Gess, MD  rosuvastatin (CRESTOR) 20 MG tablet Take 1 tablet (20 mg total) by mouth daily. 01/07/16  Yes Shanker Levora Dredge, MD  potassium chloride SA (K-DUR,KLOR-CON) 20 MEQ tablet TAKE ONE TABLET BY MOUTH ONE TIME DAILY Patient not taking: Reported on 01/06/2016 06/05/13   Runell Gess, MD     Social History   Social History  . Marital Status: Married    Spouse Name: N/A  . Number of Children: 1  . 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 Smoking status: Never Smoker   . Smokeless tobacco: Never Used  . Alcohol Use: No  . Drug Use: No  . Sexual Activity: Yes   Other Topics Concern  . Not on file   Social History Narrative   Consumes no caffeine    Family Status  Relation Status Death Age  . Mother Deceased 58s  . Father Deceased 27s    WWII  . Maternal Grandmother Deceased     cancer  . Maternal Grandfather Deceased     heart disease  . Paternal Grandmother Deceased 26s  . Paternal Grandfather Deceased 60    pna   Family History  Problem Relation Age of Onset  . Cancer Maternal Grandmother   . Heart disease Maternal Grandfather   . Pneumonia Paternal Grandfather      ROS:  Full 14 point review of systems complete and found to be negative unless listed above.  Physical Exam: Blood pressure 127/62, pulse 57, temperature 97.5 F (36.4 C), temperature source Oral, resp. rate 18, height 5\' 11"  (1.803 m), weight 219 lb 5.7 oz (99.5 kg), SpO2 97 %.  General: Well developed, well nourished, male in no acute distress. obese Head: Eyes PERRLA, No xanthomas.   Normocephalic and atraumatic, oropharynx without edema or exudate.  Lungs: CTAB Heart: HRRR S1 S2, no rub/gallop, Heart irregular rate and slow rhythm with S1, S2   murmur. pulses are 2+ extrem.   Neck: No carotid bruits. No lymphadenopathy.  No JVD. Abdomen: Bowel sounds present, abdomen soft and non-tender without masses or hernias noted. Msk:  No spine or cva tenderness. No weakness, no joint deformities or effusions. Extremities: No clubbing or cyanosis. No LE  edema.  Neuro: Alert and oriented X 3. No focal deficits noted. Psych:  Good affect, responds appropriately Skin: No rashes or lesions noted.  Labs:   Lab Results  Component Value Date   WBC 7.6 01/13/2016   HGB 13.6 01/13/2016   HCT  39.8 01/13/2016   MCV 89.4 01/13/2016   PLT 215 01/13/2016    Recent Labs  01/12/16 0312  INR 2.78*    Recent Labs Lab 01/12/16 0220  01/13/16 0510  NA 133*  < > 135  K 3.1*  < > 3.3*  CL 96*  < > 101  CO2 22  --  21*  BUN 22*  < > 25*  CREATININE 1.65*  < > 1.34*  CALCIUM 8.9  --  8.6*  PROT 7.0  --   --   BILITOT 0.6  --   --   ALKPHOS 98  --   --   ALT 20  --   --   AST 26  --   --   GLUCOSE 113*  < > 97  ALBUMIN 3.9  --   --   < > = values in this interval not displayed. MAGNESIUM  Date Value Ref Range Status  01/13/2016 1.9 1.7 - 2.4 mg/dL Final   No results for input(s): CKTOTAL, CKMB, TROPONINI in the last 72 hours.  Recent Labs  01/12/16 0318  TROPIPOC 0.01   No results found for: PROBNP Lab Results  Component Value Date   CHOL 216* 01/07/2016   HDL 31* 01/07/2016   LDLCALC 158* 01/07/2016   TRIG 135 01/07/2016   No results found for: DDIMER No results found for: LIPASE, AMYLASE TSH  Date/Time Value Ref Range Status  01/13/2016 12:21 PM 3.397 0.350 - 4.500 uIU/mL Final  02/29/2012 04:46 PM 1.857 0.350 - 4.500 uIU/mL Final   VITAMIN B-12  Date/Time Value Ref Range Status  01/13/2016 12:21 PM 296 180 - 914 pg/mL Final    Comment:    (NOTE) This assay is not validated for testing neonatal or myeloproliferative syndrome specimens for Vitamin B12 levels.    FOLATE  Date/Time Value Ref Range Status    01/13/2016 12:21 PM 20.0 >5.9 ng/mL Final    Echo: 01/07/2016 LV EF: 60% - 65% Study Conclusions - Left ventricle: The cavity size was normal. Wall thickness was  increased in a pattern of mild LVH. Systolic function was normal.  The estimated ejection fraction was in the range of 60% to 65%.  Wall motion was normal; there were no regional wall motion  abnormalities. Doppler parameters are consistent with abnormal  left ventricular relaxation (grade 1 diastolic dysfunction). - Aortic valve: There was no stenosis. There was trivial  regurgitation. - Mitral valve: Mildly calcified annulus. There was no significant  regurgitation. - Left atrium: The atrium was mildly dilated. - Right ventricle: The cavity size was normal. Systolic function  was normal. - Right atrium: The atrium was mildly dilated. - Pulmonary arteries: No complete TR doppler jet so unable to  estimate PA systolic pressure. Impressions: - Normal LV size with mild LV hypertrophy. EF 60-65%. Normal RV  size and systolic function. No significant valvular  abnormalities.   ECG:  Atrial fibrillation with slow VR  Radiology:  Ct Angio Head W/cm &/or Wo Cm  01/12/2016  CLINICAL DATA:  Follow-up code stroke. Bilateral leg weakness, RIGHT hand tingling. History of RIGHT occipital lobe infarct, hypertension, dizziness, migraines. EXAM: CT ANGIOGRAPHY HEAD AND NECK TECHNIQUE: Multidetector CT imaging of the head and neck was performed using the standard protocol during bolus administration of intravenous contrast. Multiplanar CT image reconstructions and MIPs were obtained to evaluate the vascular anatomy. Carotid stenosis measurements (when applicable) are obtained utilizing NASCET criteria, using the distal internal carotid diameter as the denominator. CONTRAST:  50 cc Isovue 370 COMPARISON:  MRI head January 12, 2016 at 1330 hours and MRA head January 06, 2016 FINDINGS: CT HEAD: INTRACRANIAL CONTENTS: No  intraparenchymal hemorrhage, mass effect nor midline shift. Patchy supratentorial white matter hypodensities are within normal range for patient's age and though non-specific likely represent chronic small vessel ischemic disease. No acute large vascular territory infarcts. Old LEFT basal ganglia lacunar infarct with mild ex vacuo dilatation frontal horn of LEFT lateral ventricle. No abnormal extra-axial fluid collections. Basal cisterns are patent. Moderate calcific atherosclerosis of the carotid siphons. No abnormal intracranial enhancement. ORBITS: The included ocular globes and orbital contents are non-suspicious. SINUSES: Small RIGHT maxillary mucosal retention cyst. Small LEFT frontal sinus mucosal retention cyst. Mastoid air cells are well aerated. SKULL/SOFT TISSUES: No skull fracture. No significant soft tissue swelling. CTA NECK Aortic arch: Normal appearance of the thoracic arch, normal branch pattern. Mild calcific atherosclerosis of the aortic arch. The origins of the innominate, left Common carotid artery and subclavian artery are widely patent. Irregular intimal thickening LEFT subclavian artery origin. Right carotid system: Common carotid artery is widely patent, coursing in a straight line fashion. Normal appearance of the carotid bifurcation without hemodynamically significant stenosis by NASCET criteria. Mild eccentric calcific atherosclerosis. Normal appearance of the included internal carotid artery. Left carotid system: Common carotid artery is widely patent, coursing in a straight line fashion. Mild intimal thickening calcific atherosclerosis. Normal appearance of the carotid bifurcation without hemodynamically significant stenosis by NASCET criteria. Mild intimal thickening and calcific atherosclerosis. Normal appearance of the included internal carotid artery. Vertebral arteries:LEFT vertebral artery is dominant. Severe luminal irregularity of the RIGHT vertebral artery throughout the  course, patent through RIGHT V3 segment. Skeleton: No acute osseous process though bone windows have not been submitted. Bulky ventral cervical spine osteophytes compatible with DISH deform the posterior pharynx. Other neck: Soft tissues of the neck are non-acute though, not tailored for evaluation. CTA HEAD Anterior circulation: Patent internal carotid arteries bilaterally. Severe stenosis RIGHT supraclinoid internal carotid artery due to calcific atherosclerosis, mild stenosis LEFT carotid siphon. Normal appearance of the anterior cerebral arteries with patent anterior communicating artery. Moderate stenosis RIGHT M1 segment. Mild luminal irregularity of the remaining vessels compatible with atherosclerosis. Posterior circulation: Faint contrast opacification of RIGHT V4 segment proximally, occluded distal RIGHT V4 segment. Mild luminal irregularity due to calcific atherosclerosis the LEFT vertebral artery. Focal severe stenosis proximal basilar artery with mild poststenotic dilatation. Moderate stenosis distal basilar artery. Tandem severe stenosis LEFT posterior cerebral artery including P1 and P2 segments. Moderate stenosis of RIGHT P2 segment. No large vessel occlusion, contrast extravasation or aneurysm within the anterior nor posterior circulation. IMPRESSION: CT HEAD: Known acute LEFT temporoparietal lobe infarcts not apparent by CT. Chronic changes including old LEFT basal ganglia lacunar infarct and moderate chronic small vessel ischemic disease. 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 Atherosclerosis without hemodynamically significant stenosis of the carotid arteries. CTA HEAD: Stable angiographic appearance of the brain: Chronically occluded distal RIGHT V4 segment. High-grade stenosis proximal basilar artery. Tandem high-grade stenoses LEFT posterior cerebral artery. Moderate stenosis RIGHT P2 segment. Moderate stenosis RIGHT M1 segment.  Electronically Signed   By: Awilda Metro M.D.   On: 01/12/2016 23:03   Ct Angio Neck W/cm &/or Wo/cm  01/12/2016  CLINICAL DATA:  Follow-up code stroke. Bilateral leg weakness, RIGHT hand tingling. History of RIGHT occipital lobe infarct, hypertension, dizziness, migraines. EXAM: CT ANGIOGRAPHY HEAD AND NECK TECHNIQUE: Multidetector CT imaging of  the head and neck was performed using the standard protocol during bolus administration of intravenous contrast. Multiplanar CT image reconstructions and MIPs were obtained to evaluate the vascular anatomy. Carotid stenosis measurements (when applicable) are obtained utilizing NASCET criteria, using the distal internal carotid diameter as the denominator. CONTRAST:  50 cc Isovue 370 COMPARISON:  MRI head January 12, 2016 at 1330 hours and MRA head January 06, 2016 FINDINGS: CT HEAD: INTRACRANIAL CONTENTS: No intraparenchymal hemorrhage, mass effect nor midline shift. Patchy supratentorial white matter hypodensities are within normal range for patient's age and though non-specific likely represent chronic small vessel ischemic disease. No acute large vascular territory infarcts. Old LEFT basal ganglia lacunar infarct with mild ex vacuo dilatation frontal horn of LEFT lateral ventricle. No abnormal extra-axial fluid collections. Basal cisterns are patent. Moderate calcific atherosclerosis of the carotid siphons. No abnormal intracranial enhancement. ORBITS: The included ocular globes and orbital contents are non-suspicious. SINUSES: Small RIGHT maxillary mucosal retention cyst. Small LEFT frontal sinus mucosal retention cyst. Mastoid air cells are well aerated. SKULL/SOFT TISSUES: No skull fracture. No significant soft tissue swelling. CTA NECK Aortic arch: Normal appearance of the thoracic arch, normal branch pattern. Mild calcific atherosclerosis of the aortic arch. The origins of the innominate, left Common carotid artery and subclavian artery are widely patent.  Irregular intimal thickening LEFT subclavian artery origin. Right carotid system: Common carotid artery is widely patent, coursing in a straight line fashion. Normal appearance of the carotid bifurcation without hemodynamically significant stenosis by NASCET criteria. Mild eccentric calcific atherosclerosis. Normal appearance of the included internal carotid artery. Left carotid system: Common carotid artery is widely patent, coursing in a straight line fashion. Mild intimal thickening calcific atherosclerosis. Normal appearance of the carotid bifurcation without hemodynamically significant stenosis by NASCET criteria. Mild intimal thickening and calcific atherosclerosis. Normal appearance of the included internal carotid artery. Vertebral arteries:LEFT vertebral artery is dominant. Severe luminal irregularity of the RIGHT vertebral artery throughout the course, patent through RIGHT V3 segment. Skeleton: No acute osseous process though bone windows have not been submitted. Bulky ventral cervical spine osteophytes compatible with DISH deform the posterior pharynx. Other neck: Soft tissues of the neck are non-acute though, not tailored for evaluation. CTA HEAD Anterior circulation: Patent internal carotid arteries bilaterally. Severe stenosis RIGHT supraclinoid internal carotid artery due to calcific atherosclerosis, mild stenosis LEFT carotid siphon. Normal appearance of the anterior cerebral arteries with patent anterior communicating artery. Moderate stenosis RIGHT M1 segment. Mild luminal irregularity of the remaining vessels compatible with atherosclerosis. Posterior circulation: Faint contrast opacification of RIGHT V4 segment proximally, occluded distal RIGHT V4 segment. Mild luminal irregularity due to calcific atherosclerosis the LEFT vertebral artery. Focal severe stenosis proximal basilar artery with mild poststenotic dilatation. Moderate stenosis distal basilar artery. Tandem severe stenosis LEFT posterior  cerebral artery including P1 and P2 segments. Moderate stenosis of RIGHT P2 segment. No large vessel occlusion, contrast extravasation or aneurysm within the anterior nor posterior circulation. IMPRESSION: CT HEAD: Known acute LEFT temporoparietal lobe infarcts not apparent by CT. Chronic changes including old LEFT basal ganglia lacunar infarct and moderate chronic small vessel ischemic disease. 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 Atherosclerosis without hemodynamically significant stenosis of the carotid arteries. CTA HEAD: Stable angiographic appearance of the brain: Chronically occluded distal RIGHT V4 segment. High-grade stenosis proximal basilar artery. Tandem high-grade stenoses LEFT posterior cerebral artery. Moderate stenosis RIGHT P2 segment. Moderate stenosis RIGHT M1 segment. Electronically Signed   By: Awilda Metroourtnay  Bloomer  M.D.   On: 01/12/2016 23:03   [name]  01/12/2016  CLINICAL DATA:  Atrial fibrillation. Migraine headache. Recent stroke and TIA. Right arm numbness and right leg weakness. EXAM: MRI HEAD WITHOUT CONTRAST TECHNIQUE: Multiplanar, multiecho pulse sequences of the brain and surrounding structures were obtained without intravenous contrast. COMPARISON:  Head CT same day.  MRI 01/06/2016. FINDINGS: Few small punctate foci of acute infarctions seen on the study of 6 days ago of largely lost their restricted diffusion. However, there are several new punctate foci of acute infarction, most prominently clustered in the left temporal lobe but also seen in the right posterior parietal lobe. These are again consistent with micro embolic infarctions. No large vessel territory infarction. No mass lesion, hemorrhage, hydrocephalus or extra-axial collection. Chronic small-vessel ischemic changes present elsewhere throughout the brain. Old left basal ganglia infarction. Old focus of hemosiderin deposition associated with a few  of the old small vessel infarctions. No pituitary mass. No inflammatory sinus disease. No skull or skullbase lesion. Major vessels at the base of the brain show flow. IMPRESSION: New punctate foci of acute infarction in the left temporal lobe and right posterior parietal lobe. These are again consistent with micro embolic infarctions. No swelling, mass effect or acute hemorrhage. Extensive old small vessel insults elsewhere throughout the brain, some associated with hemosiderin deposition. Electronically Signed   By: Paulina Fusi M.D.   On: 01/12/2016 15:52   Ct Head Code Stroke W/o Cm  01/12/2016  CLINICAL DATA:  Bilateral leg weakness. Progression to right leg numbness. EXAM: CT HEAD WITHOUT CONTRAST TECHNIQUE: Contiguous axial images were obtained from the base of the skull through the vertex without intravenous contrast. COMPARISON:  Brain MRI from 6 days ago FINDINGS: Skull and Sinuses:Negative for fracture or destructive process. Visualized orbits: Negative. Brain: No evidence of acute infarction, hemorrhage, hydrocephalus, or mass lesion/mass effect. Small occipital cortex infarcts described on comparison brain MRI are not visible by CT. No evidence of infarct extension or hemorrhagic conversion. Chronic microvascular disease with remote left caudate head lacunar infarct. Critical Value/emergent results were called by telephone at the time of interpretation on 01/12/2016 at 3:25 am to Dr. Roseanne Reno, who verbally acknowledged these results. IMPRESSION: 1. No acute finding. 2. Small cortical infarcts seen on brain MRI 6 days prior are not visible. No hemorrhagic conversion or evidence of infarct extension. 3. Chronic microvascular disease. Electronically Signed   By: Marnee Spring M.D.   On: 01/12/2016 03:27    ASSESSMENT AND PLAN:    Principal Problem:   Stroke (cerebrum) (HCC) Active Problems:   HTN (hypertension)   Hx-TIA (transient ischemic attack) June 2008   PAF (paroxysmal atrial fibrillation)  by Holter June 2012   Chronic anticoagulation- Xarelto   Acute CVA (cerebrovascular accident) (HCC)   CKD (chronic kidney disease), stage III   HLD (hyperlipidemia)   Hypokalemia   TIA (transient ischemic attack)   Migraine with aura and without status migrainosus, not intractable   Cerebrovascular accident (CVA) due to bilateral thrombosis of posterior cerebral arteries (HCC)   Adrian Melendez is a 81 y.o. male with a history of PAF on Xarelto, HTN, HLD, obesity, OSA, CKD stage III, and previous CVA who presented to [facility] on 01/12/16 with right sided weakness. Cardiology consulted for afib with slow ventricular rate bradycardia with pauses.   Atrial fibrillation with slow ventricular response: The patient was noted to be in atrial fibrillation with frequent pauses up to 2.7 second pauses. He has a long  history of this. He is totally asymptomatic with no pre syncope or syncope. No indication for PPM unless over 3 seconds or asymptomatic. 2D ECHO with normal LV function and no significant valvular abnormalities.  CHADS VASC 5, continue xarelto  CVA/TIA with a recent history of CVA - MRI showed new punctate foci of acute infarction in the left temporal lobe and right posterior parietal lobe consistent with microembolic infarctions - Continue xarelto, statin, neurology following  Hypotension with history of Hypertension - Home Micardis and amlodipine held.  - Orthostatic felt likely due to posterior circulation basilar arteries stenosis, continue IV fluids, thigh high ted hose  Mild acute on CKD (chronic kidney disease), stage III: Baseline creatinine 1.35-1.44, his creatinine is 1.65 -Creatinine function improving, Hold Micardis, placed on gentle hydration  HLD: cont statin   Retinal acephalgic migraines:  -Patient was started on Depakote by neurology during previous admission, will continue    Signed: Janetta Hora, PA-C 01/13/2016 3:24 PM  Pager [phone]  Co-Sign MD  I  have seen, examined and evaluated the patient this PM along with Ms.Janee Morn, PA-C.  After reviewing all the available data and chart,  I agree with her findings, examination as well as impression recommendations.  Patient is a well-known long-term patient of Dr. Nanetta Batty. He has long-standing atrial fibrillation with slow ventricular response. He has been off of AV nodal agents for a long time. He was now admitted for TIA/CVA. We were asked to consult based on age fibrillation with slow response. He is already on anticoagulation.  Review of telemetry shows ventricular rates as low as 40s with pauses up to 2.7 seconds. We discussed this with the electrophysiology service. They're more concerned with pauses greater than 2 seconds and symptomatic. He is somewhat borderline, therefore I would continue to monitor on telemetry. We will continue to follow his telemetry from a distance and the available if any assistance is required. For now I would simply continue to avoid any AV nodal agents. Continue Xarelto and statin.  Once blood pressures are more stabilized, would restart Micardis first then amlodipine (as a dihydropyridine calcium channel blocker, this does not affect heart rate)    HARDING, Piedad Climes, M.D., M.S. Interventional Cardiologist   Pager # [phone] Phone # [phone] [address] [place], [place] [address]

## 2016-01-13 NOTE — Evaluation (Signed)
SLP Cancellation Note  Patient Details Name: Adrian Melendez MRN: [id number] DOB: 03/26/1935   Cancelled treatment:       Reason Eval/Treat Not Completed: Patient at procedure or test/unavailable (pt with therapy, will continue efforts)   Chales AbrahamsKimball, Hania Cerone Ann 01/13/2016, 10:28 AM

## 2016-01-13 NOTE — Progress Notes (Signed)
Received call from CCMD and informed patient remains in SB. RN aware. CCMD also asked if they could turn off the alarms and change parameters because the are constantly alarming. RN informed not to turn off alarms and to continue to be notified for changes. Initially handoff of the patient from previous RN is patient has been in SB during am shift.  Patient stable on unit and observed sleeping without distress. Will continue to monitor.

## 2016-01-14 DIAGNOSIS — I63311 Cerebral infarction due to thrombosis of right middle cerebral artery: Secondary | ICD-10-CM

## 2016-01-14 LAB — CBC
HEMATOCRIT: 40.5 % (ref 39.0–52.0)
HEMOGLOBIN: 13.7 g/dL (ref 13.0–17.0)
MCH: 30.4 pg (ref 26.0–34.0)
MCHC: 33.8 g/dL (ref 30.0–36.0)
MCV: 90 fL (ref 78.0–100.0)
Platelets: 217 10*3/uL (ref 150–400)
RBC: 4.5 MIL/uL (ref 4.22–5.81)
RDW: 13.3 % (ref 11.5–15.5)
WBC: 8.3 10*3/uL (ref 4.0–10.5)

## 2016-01-14 LAB — GLUCOSE, CAPILLARY: Glucose-Capillary: 97 mg/dL (ref 65–99)

## 2016-01-14 LAB — BASIC METABOLIC PANEL
ANION GAP: 11 (ref 5–15)
BUN: 24 mg/dL — ABNORMAL HIGH (ref 6–20)
CALCIUM: 8.9 mg/dL (ref 8.9–10.3)
CHLORIDE: 101 mmol/L (ref 101–111)
CO2: 23 mmol/L (ref 22–32)
Creatinine, Ser: 1.35 mg/dL — ABNORMAL HIGH (ref 0.61–1.24)
GFR calc non Af Amer: 49 mL/min — ABNORMAL LOW (ref >60)
GFR, EST AFRICAN AMERICAN: 56 mL/min — AB (ref >60)
Glucose, Bld: 102 mg/dL — ABNORMAL HIGH (ref 65–99)
POTASSIUM: 3.8 mmol/L (ref 3.5–5.1)
Sodium: 135 mmol/L (ref 135–145)

## 2016-01-14 NOTE — Progress Notes (Signed)
SLP Cancellation Note  Patient Details Name: Mellody DrownWilliam R Bonfiglio MRN: [id number] DOB: 09/26/1934   Cancelled treatment:       Reason Eval/Treat Not Completed: SLP screened, no needs identified, will sign off   Elex Mainwaring,PAT, M.S., CCC-SLP 01/14/2016, 11:01 AM

## 2016-01-14 NOTE — Progress Notes (Deleted)
Patient arrived to 5M08 at 1600. Patient alert and oriented X4. Vital signs taken and charted. O2 and IVF. Foley catheter in place. Oriented to room with bed alarm on . Diet ordered. Family at bedside.         

## 2016-01-14 NOTE — Progress Notes (Signed)
Physical Therapy Treatment Patient Details Name: Adrian Melendez MRN: [id number] DOB: 1935-07-31 Today's Date: 01/14/2016    History of Present Illness Patient is a 81 y/o male with hx of recent CVA (admitted 4/20-4/21 with occipital infarct) , gout, TIA, HTN, HLD, A-fib and near syncope presents with right sided weakness/numbness. MRI- New punctate foci of acute infarction in the left temporal lobe and right posterior parietal lobe.     PT Comments    Patient reports not feeling great today. Reports increased auras and ear fullness bilaterally as well as feeling weak all over. Tolerated gait training today but decreased distance from prior session due to fatigue. Weakness noted in BLEs. Encouraged short distance ambulation with RN while in hospital. Will continue to follow per current POC.   Follow Up Recommendations  Home health PT;Supervision - Intermittent     Equipment Recommendations  None recommended by PT    Recommendations for Other Services       Precautions / Restrictions Precautions Precautions: Fall Precaution Comments: h/o a-fib Restrictions Weight Bearing Restrictions: No    Mobility  Bed Mobility Overal bed mobility: Modified Independent             General bed mobility comments: No use of rail. + fullness in ear upon sitting up.  Transfers Overall transfer level: Needs assistance Equipment used: None Transfers: Sit to/from Stand Sit to Stand: Supervision         General transfer comment: Supervision for safety. No dizziness with sit to stand. Stood from [employer].   Ambulation/Gait Ambulation/Gait assistance: Min guard Ambulation Distance (Feet): 125 Feet Assistive device: None Gait Pattern/deviations: Step-through pattern;Decreased stride length Gait velocity: decreased   General Gait Details: Slow, mostly steady gait but fatigues quickly. 1 standing rest break. No knee buckling noted today but pt reports not feeling well. Decreased hip  mobility noted during gait.   Stairs            Wheelchair Mobility    Modified Rankin (Stroke Patients Only) Modified Rankin (Stroke Patients Only) Pre-Morbid Rankin Score: No significant disability Modified Rankin: Moderately severe disability     Balance Overall balance assessment: Needs assistance Sitting-balance support: Feet supported;No upper extremity supported Sitting balance-Leahy Scale: Good     Standing balance support: During functional activity Standing balance-Leahy Scale: Fair                      Cognition Arousal/Alertness: Awake/alert Behavior During Therapy: WFL for tasks assessed/performed Overall Cognitive Status: Within Functional Limits for tasks assessed                      Exercises      General Comments General comments (skin integrity, edema, etc.): Family present during session. Pt reports getting auras still - but frequency and duration has decreased. Reports fullness in ears bilaterally- resolved within 2 minutes.      Pertinent Vitals/Pain Pain Assessment: No/denies pain    Home Living                      Prior Function            PT Goals (current goals can now be found in the care plan section) Progress towards PT goals: Progressing toward goals (slowly)    Frequency  Min 4X/week    PT Plan Current plan remains appropriate    Co-evaluation             End  of Session Equipment Utilized During Treatment: Gait belt Activity Tolerance: Patient limited by fatigue;Other (comment) (not feeling well.) Patient left: in bed;with call bell/phone within reach;with family/visitor present     Time: 1135-1157 PT Time Calculation (min) (ACUTE ONLY): 22 min  Charges:  $Gait Training: 8-22 mins                    G Codes:      Layne Dilauro A Malaiya Paczkowski 01/14/2016, 1:29 PM  Mylo RedShauna Ashanta Amoroso, PT, DPT [phone]

## 2016-01-14 NOTE — Care Management (Signed)
Patient is a 81 y/o male with hx of recent CVA (admitted 4/20-4/21 with occipital infarct) , gout, TIA, HTN, HLD, A-fib and near syncope presents with right sided weakness/numbness. MRI- New punctate foci of acute infarction in the left temporal lobe and right posterior parietal lobe. PT recommending HHPT which pt and wife DO NOT WANT at this time. Made family aware that they can call PCP at any time after discharge if they change there minds and decide they need assist at home. No further CM needs at this time but will be available should additional disposition needs arise.

## 2016-01-14 NOTE — Progress Notes (Signed)
Patient reported no changes during shift. No new neuro deficits noted.

## 2016-01-14 NOTE — Progress Notes (Signed)
STROKE TEAM PROGRESS NOTE   SUBJECTIVE (INTERVAL HISTORY) His wife and daughter are at the bedside.  Overall he feels his condition is stable. He complains that last night he was sleeping and felt his right arm going up to the sky but actually his arm did not move. He also felt some perioral numbness this morning lasting 30 sec. No HA. He did not feel very well this am but he was joking, laughing with me during round.    OBJECTIVE Temp:  [97.8 F (36.6 C)-98.6 F (37 C)] 98 F (36.7 C) (04/28 1805) Pulse Rate:  [52-95] 56 (04/28 1805) Cardiac Rhythm:  [-] Atrial fibrillation (04/28 0700) Resp:  [16-18] 17 (04/28 1805) BP: (102-156)/(55-70) 118/59 mmHg (04/28 1805) SpO2:  [94 %-98 %] 97 % (04/28 1805)  CBC:   Recent Labs Lab 01/12/16 0220  01/13/16 0510 01/14/16 0223  WBC 8.3  --  7.6 8.3  NEUTROABS 4.6  --   --   --   HGB 14.6  < > 13.6 13.7  HCT 42.6  < > 39.8 40.5  MCV 89.5  --  89.4 90.0  PLT 250  --  215 217  < > = values in this interval not displayed.  Basic Metabolic Panel:   Recent Labs Lab 01/13/16 0510 01/14/16 0223  NA 135 135  K 3.3* 3.8  CL 101 101  CO2 21* 23  GLUCOSE 97 102*  BUN 25* 24*  CREATININE 1.34* 1.35*  CALCIUM 8.6* 8.9  MG 1.9  --     Lipid Panel:     Component Value Date/Time   CHOL 216* 01/07/2016 0258   TRIG 135 01/07/2016 0258   HDL 31* 01/07/2016 0258   CHOLHDL 7.0 01/07/2016 0258   VLDL 27 01/07/2016 0258   LDLCALC 158* 01/07/2016 0258   HgbA1c:  Lab Results  Component Value Date   HGBA1C 6.3* 01/07/2016   Urine Drug Screen:     Component Value Date/Time   LABOPIA NONE DETECTED 01/12/2016 0411   COCAINSCRNUR NONE DETECTED 01/12/2016 0411   LABBENZ NONE DETECTED 01/12/2016 0411   AMPHETMU NONE DETECTED 01/12/2016 0411   THCU NONE DETECTED 01/12/2016 0411   LABBARB NONE DETECTED 01/12/2016 0411      IMAGING I have personally reviewed the radiological images below and agree with the radiology  interpretations.  Ct Head Code Stroke W/o Cm 01/12/2016   1. No acute finding. 2. Small cortical infarcts seen on brain MRI 6 days prior are not visible. No hemorrhagic conversion or evidence of infarct extension. 3. Chronic microvascular disease.   [name]  01/12/2016   IMPRESSION: New punctate foci of acute infarction in the left temporal lobe and right posterior parietal lobe. These are again consistent with micro embolic infarctions. No swelling, mass effect or acute hemorrhage. Extensive old small vessel insults elsewhere throughout the brain, some associated with hemosiderin deposition.   Ct Angio Head and Neck W/cm &/or Wo/cm  01/12/2016  IMPRESSION: CT HEAD: Known acute LEFT temporoparietal lobe infarcts not apparent by CT. Chronic changes including old LEFT basal ganglia lacunar infarct and moderate chronic small vessel ischemic disease. 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 Atherosclerosis without hemodynamically significant stenosis of the carotid arteries. CTA HEAD: Stable angiographic appearance of the brain: Chronically occluded distal RIGHT V4 segment. High-grade stenosis proximal basilar artery. Tandem high-grade stenoses LEFT posterior cerebral artery. Moderate stenosis RIGHT P2 segment. Moderate stenosis RIGHT M1 segment.  PHYSICAL EXAM  Temp:  [97.8 F (36.6 C)-98.6 F (37 C)] 98 F (36.7 C) (04/28 1805) Pulse Rate:  [52-95] 56 (04/28 1805) Resp:  [16-18] 17 (04/28 1805) BP: (102-156)/(55-70) 118/59 mmHg (04/28 1805) SpO2:  [94 %-98 %] 97 % (04/28 1805)  General - Well nourished, well developed, in no apparent distress.  Ophthalmologic - Fundi not visualized due to eye movement.  Cardiovascular - irregularly irregular heart rate and rhythm.  Mental Status -  Level of arousal and orientation to time, place, and person were intact. Language including expression, naming, repetition,  comprehension was assessed and found intact. Fund of Knowledge was assessed and was intact.  Cranial Nerves II - XII - II - Visual field intact OU. III, IV, VI - Extraocular movements intact. V - Facial sensation intact bilaterally. VII - Facial movement intact bilaterally. VIII - Hearing & vestibular intact bilaterally. X - Palate elevates symmetrically. XI - Chin turning & shoulder shrug intact bilaterally. XII - Tongue protrusion intact.  Motor Strength - The patient's strength was normal in all extremities and pronator drift was absent.  Bulk was normal and fasciculations were absent.   Motor Tone - Muscle tone was assessed at the neck and appendages and was normal.  Reflexes - The patient's reflexes were 1+ in all extremities and he had no pathological reflexes.  Sensory - Light touch, temperature/pinprick were assessed and were symmetrical.    Coordination - The patient had normal movements in the hands and feet with no ataxia or dysmetria.  Tremor was absent.  Gait and Station - not tested due to safety concerns.   ASSESSMENT/PLAN [name] is a 81 y.o. male with history of atrial fibrillation on xarelto, retinal migraines and right occipital embolic infarcts d/t afib vs migrainous infarct vs symptomatic BA stenosis last week who presents now with transient right upper extremity and right lower extremity numbness. He did not receive IV t-PA due to resolved deficits.   Stroke: acute punctate right PCA and left MCA/PCA territory infarcts, new from last admission, likely symptomatic BA and PCA stenosis in setting of orthostatic hypotension  MRI  Punctate right PCA and left MCA/PCA territory infarcts   CTA head and neck unremarkable  CUS - unremarkable  TTE - EF 60-65%  LDL 158  A1C 6.3  xarelto for VTE prophylaxis Diet Heart Room service appropriate?: Yes; Fluid consistency:: Thin  aspirin 81 mg daily and Xarelto (rivaroxaban) daily prior to admission,  now on aspirin 81 mg daily and Xarelto (rivaroxaban) daily   Therapy recommendations:  No therapy needs  Disposition:  pending   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 Allyson Sabal  Hyperlipidemia  Home meds:  crestor 20 mg daily - started last week in setting of previous Intolerance to multiple statins in the past  LDL 158 last admission  Continue statin at discharge  Migraines with aura  Discharge on depakote last week  Still has visual aura but improving  Had brief perioral numbness this am  Other Stroke Risk Factors  Advanced age  Obesity, Body mass index is 30.61 kg/(m^2).   Hx stroke/TIA  12/2015 Right occipital embolic infarcts d/t afib vs migrainous infarct vs symptomatic BA stenosis   Other Active Problems  CKD stage III  [facility]  H[facility] day # 2  Dr. Pearlean BrownieSethi to see pt in am before discharge. Thanks.   Marvel PlanJindong Salisha Bardsley, MD PhD Stroke Neurology 01/14/2016 6:39 PM   To contact Stroke Continuity provider, please refer to [email or web address]. After hours, contact General Neurology

## 2016-01-14 NOTE — Progress Notes (Addendum)
Asked to review telemetry on patient.  Strips show atrial fibrillation with slow ventricular response.  Pt is completely asymptomatic per Dr Herbie BaltimoreHarding who saw him yesterday If patient remains asymptomatic, in the absence of symptoms, there is no indication for PPM.  Review of event monitor from 2013 shows similar bradycardia while in AF.  Discussed with Dr Herbie BaltimoreHarding. If there are concerns that he may be symptomatic or if full EP consult is needed, please call.  Gypsy BalsamAmber Tareq Dwan, NP 01/14/2016 12:26 PM

## 2016-01-14 NOTE — Progress Notes (Signed)
[facility]                                                                              Patient Demographics  Adrian Melendez, is a 81 y.o. male, DOB - Apr 19, 1935, ZOX:[id number]  Admit date - 01/12/2016   Admitting Physician Lorretta Harp, MD  Outpatient Primary MD for the patient is Garlan Fillers, MD  Outpatient specialists: Neurology, Dr. Frances Furbish                                          Cardiology, Dr. Allyson Sabal   LOS - 2  days    Chief Complaint  Patient presents with  . Code Stroke       Brief summary   Briefly Adrian Melendez is a 81 y.o. male with atrial fibrillation on Xarelto, CKDstage III, hypertension, hyperlipidemia, TIA, migraine headache, recent CVA, who presents with right arm numbness and right leg weakness.  Patient was recently hospitalized from 4/20-4/21 and diagnosed with right occipital infarct (suspect is as migrainous infarct, could be related to atherosclerosis). Patient was discharged on aspirin, Crestor and Keppra. Patient presented with weakness in his legs for the past 4 days and right hand tingling and numbness since 1 AM on the day of admission. In ED, he had one episode of right leg weakness. All symptoms resolved spontaneously. MRI on 01/06/2016 showed multiple subcentimeter and occipital infarctions and MRA showed severe proximal basal artery stenosis with severe left PCA stenosis at the origin.   Assessment & Plan   CVA/TIA with a recent history of CVA - Neurology was consulted and recommended stroke workup,  - MRI showed new punctate foci of acute infarction in the left temporal lobe and right posterior parietal lobe consistent with microembolic infarctions - will not repeat 2-D echo or carotid Dopplers, had a recent thorough workup - Continue xarelto, statin, neurology following - in the evening yesterday, patient had an episode of right arm numbness, ? Jerking movement, questionable seizure kind of episode, talked to neurology,  appreciate Dr Warren Danes recommendations, does not appear to be seizure episode as patient has punctate infarctions which usually does not cause seizures and patient's description of his episode is not consistent with seizures.  Hypotension with history of Hypertension - BP still intermittently high or low, continue to hold micardis, amlodipine  - Orthostatic, likely due to posterior circulation basilar arteries stenosis, continue IV fluids, thigh high ted hoses -  cortisol level, B12, folate normal  Atrial fibrillation with Bradycardia heart rate persisting in 30s to 40s intermittently - Not on any rate controlling medications, discontinued amlodipine also -  2-D echo 4/21 had shown EF of 60-65% with grade 1 diastolic dysfunction, no aortic stenosis - CHADS VASC 5, continue xarelto - Appreciate cardiology conditions, no indication of pacemaker, TSH 3.3  Mild acute on CKD (chronic kidney disease), stage III: Baseline creatinine 1.35-1.44, his creatinine is 1.65 -Creatinine function improving, Hold Micardis, placed on gentle hydration  HLD: Last LDL was 158/21/17  -Continue home medications: Continue Crestor  Retinal acephalgic migraines:  -  Patient was started on Depakote by neurology during previous admission, will continue   Code Status: Full code  Family Communication: Discussed in detail with the patient, all imaging results, lab results explained to the patient and daughter in the room   Disposition Plan: Discussed with patient in detail, he is feeling not too well today, weak. Plan to DC home in a.m.  Time Spent in minutes   25 minutes  Procedures  MRI brain  Consults   Cardiology Neuro  DVT Prophylaxis scd's   Medications  Scheduled Meds: .  stroke: mapping our early stages of recovery book   Does not apply Once  . allopurinol  450 mg Oral Daily  . aspirin  81 mg Oral Daily  . divalproex  500 mg Oral Daily  . rivaroxaban  20 mg Oral Q supper  . rosuvastatin  20 mg  Oral Daily   Continuous Infusions: . sodium chloride 75 mL/hr at 01/12/16 0626   PRN Meds:.senna-docusate   Antibiotics   Anti-infectives    None        Subjective:   Adrian Melendez was seen and examined today. Feeling somewhat weak and dizzy. Denied any chest pain, shortness of breath, abdominal pain, N/V/D/C, new weakness, numbess, tingling. No acute events overnight.    Objective:   Filed Vitals:   01/13/16 2137 01/14/16 0231 01/14/16 0600 01/14/16 0945  BP: 146/59 141/70 156/69 102/55  Pulse: 95 92 57   Temp: 98 F (36.7 C) 98.1 F (36.7 C) 97.8 F (36.6 C) 98.6 F (37 C)  TempSrc: Oral Oral Oral Oral  Resp: 18 18 18 18   Height:      Weight:      SpO2: 94% 96% 97% 98%    Intake/Output Summary (Last 24 hours) at 01/14/16 1317 Last data filed at 01/14/16 0427  Gross per 24 hour  Intake    240 ml  Output      0 ml  Net    240 ml     Wt Readings from Last 3 Encounters:  01/12/16 99.5 kg (219 lb 5.7 oz)  01/06/16 99.338 kg (219 lb)  11/25/15 102.059 kg (225 lb)     Exam  General: Alert and oriented x 3, NAD  HEENT:    Neck:   CVS: S1 S2 Clear, RRR  Respiratory: CTAB no wheezing  Abdomen: Soft, nontender, nondistended, + bowel sounds  Ext: no cyanosis clubbing or edema  Neuro: No new deficits  Skin: No rashes  Psych: Normal affect and demeanor, alert and oriented x3    Data Reviewed:  I have personally reviewed following labs and imaging studies  Micro Results No results found for this or any previous visit (from the past 240 hour(s)).  Radiology Reports Ct Angio Head W/cm &/or Wo Cm  01/12/2016  CLINICAL DATA:  Follow-up code stroke. Bilateral leg weakness, RIGHT hand tingling. History of RIGHT occipital lobe infarct, hypertension, dizziness, migraines. EXAM: CT ANGIOGRAPHY HEAD AND NECK TECHNIQUE: Multidetector CT imaging of the head and neck was performed using the standard protocol during bolus administration of intravenous  contrast. Multiplanar CT image reconstructions and MIPs were obtained to evaluate the vascular anatomy. Carotid stenosis measurements (when applicable) are obtained utilizing NASCET criteria, using the distal internal carotid diameter as the denominator. CONTRAST:  50 cc Isovue 370 COMPARISON:  MRI head January 12, 2016 at 1330 hours and MRA head January 06, 2016 FINDINGS: CT HEAD: INTRACRANIAL CONTENTS: No intraparenchymal hemorrhage, mass effect nor midline shift. Patchy supratentorial white  matter hypodensities are within normal range for patient's age and though non-specific likely represent chronic small vessel ischemic disease. No acute large vascular territory infarcts. Old LEFT basal ganglia lacunar infarct with mild ex vacuo dilatation frontal horn of LEFT lateral ventricle. No abnormal extra-axial fluid collections. Basal cisterns are patent. Moderate calcific atherosclerosis of the carotid siphons. No abnormal intracranial enhancement. ORBITS: The included ocular globes and orbital contents are non-suspicious. SINUSES: Small RIGHT maxillary mucosal retention cyst. Small LEFT frontal sinus mucosal retention cyst. Mastoid air cells are well aerated. SKULL/SOFT TISSUES: No skull fracture. No significant soft tissue swelling. CTA NECK Aortic arch: Normal appearance of the thoracic arch, normal branch pattern. Mild calcific atherosclerosis of the aortic arch. The origins of the innominate, left Common carotid artery and subclavian artery are widely patent. Irregular intimal thickening LEFT subclavian artery origin. Right carotid system: Common carotid artery is widely patent, coursing in a straight line fashion. Normal appearance of the carotid bifurcation without hemodynamically significant stenosis by NASCET criteria. Mild eccentric calcific atherosclerosis. Normal appearance of the included internal carotid artery. Left carotid system: Common carotid artery is widely patent, coursing in a straight line fashion.  Mild intimal thickening calcific atherosclerosis. Normal appearance of the carotid bifurcation without hemodynamically significant stenosis by NASCET criteria. Mild intimal thickening and calcific atherosclerosis. Normal appearance of the included internal carotid artery. Vertebral arteries:LEFT vertebral artery is dominant. Severe luminal irregularity of the RIGHT vertebral artery throughout the course, patent through RIGHT V3 segment. Skeleton: No acute osseous process though bone windows have not been submitted. Bulky ventral cervical spine osteophytes compatible with DISH deform the posterior pharynx. Other neck: Soft tissues of the neck are non-acute though, not tailored for evaluation. CTA HEAD Anterior circulation: Patent internal carotid arteries bilaterally. Severe stenosis RIGHT supraclinoid internal carotid artery due to calcific atherosclerosis, mild stenosis LEFT carotid siphon. Normal appearance of the anterior cerebral arteries with patent anterior communicating artery. Moderate stenosis RIGHT M1 segment. Mild luminal irregularity of the remaining vessels compatible with atherosclerosis. Posterior circulation: Faint contrast opacification of RIGHT V4 segment proximally, occluded distal RIGHT V4 segment. Mild luminal irregularity due to calcific atherosclerosis the LEFT vertebral artery. Focal severe stenosis proximal basilar artery with mild poststenotic dilatation. Moderate stenosis distal basilar artery. Tandem severe stenosis LEFT posterior cerebral artery including P1 and P2 segments. Moderate stenosis of RIGHT P2 segment. No large vessel occlusion, contrast extravasation or aneurysm within the anterior nor posterior circulation. IMPRESSION: CT HEAD: Known acute LEFT temporoparietal lobe infarcts not apparent by CT. Chronic changes including old LEFT basal ganglia lacunar infarct and moderate chronic small vessel ischemic disease. 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 Atherosclerosis without hemodynamically significant stenosis of the carotid arteries. CTA HEAD: Stable angiographic appearance of the brain: Chronically occluded distal RIGHT V4 segment. High-grade stenosis proximal basilar artery. Tandem high-grade stenoses LEFT posterior cerebral artery. Moderate stenosis RIGHT P2 segment. Moderate stenosis RIGHT M1 segment. Electronically Signed   By: Awilda Metro M.D.   On: 01/12/2016 23:03   Ct Angio Neck W/cm &/or Wo/cm  01/12/2016  CLINICAL DATA:  Follow-up code stroke. Bilateral leg weakness, RIGHT hand tingling. History of RIGHT occipital lobe infarct, hypertension, dizziness, migraines. EXAM: CT ANGIOGRAPHY HEAD AND NECK TECHNIQUE: Multidetector CT imaging of the head and neck was performed using the standard protocol during bolus administration of intravenous contrast. Multiplanar CT image reconstructions and MIPs were obtained to evaluate the vascular anatomy. Carotid stenosis measurements (when applicable) are obtained utilizing  NASCET criteria, using the distal internal carotid diameter as the denominator. CONTRAST:  50 cc Isovue 370 COMPARISON:  MRI head January 12, 2016 at 1330 hours and MRA head January 06, 2016 FINDINGS: CT HEAD: INTRACRANIAL CONTENTS: No intraparenchymal hemorrhage, mass effect nor midline shift. Patchy supratentorial white matter hypodensities are within normal range for patient's age and though non-specific likely represent chronic small vessel ischemic disease. No acute large vascular territory infarcts. Old LEFT basal ganglia lacunar infarct with mild ex vacuo dilatation frontal horn of LEFT lateral ventricle. No abnormal extra-axial fluid collections. Basal cisterns are patent. Moderate calcific atherosclerosis of the carotid siphons. No abnormal intracranial enhancement. ORBITS: The included ocular globes and orbital contents are non-suspicious. SINUSES: Small RIGHT maxillary mucosal retention cyst. Small  LEFT frontal sinus mucosal retention cyst. Mastoid air cells are well aerated. SKULL/SOFT TISSUES: No skull fracture. No significant soft tissue swelling. CTA NECK Aortic arch: Normal appearance of the thoracic arch, normal branch pattern. Mild calcific atherosclerosis of the aortic arch. The origins of the innominate, left Common carotid artery and subclavian artery are widely patent. Irregular intimal thickening LEFT subclavian artery origin. Right carotid system: Common carotid artery is widely patent, coursing in a straight line fashion. Normal appearance of the carotid bifurcation without hemodynamically significant stenosis by NASCET criteria. Mild eccentric calcific atherosclerosis. Normal appearance of the included internal carotid artery. Left carotid system: Common carotid artery is widely patent, coursing in a straight line fashion. Mild intimal thickening calcific atherosclerosis. Normal appearance of the carotid bifurcation without hemodynamically significant stenosis by NASCET criteria. Mild intimal thickening and calcific atherosclerosis. Normal appearance of the included internal carotid artery. Vertebral arteries:LEFT vertebral artery is dominant. Severe luminal irregularity of the RIGHT vertebral artery throughout the course, patent through RIGHT V3 segment. Skeleton: No acute osseous process though bone windows have not been submitted. Bulky ventral cervical spine osteophytes compatible with DISH deform the posterior pharynx. Other neck: Soft tissues of the neck are non-acute though, not tailored for evaluation. CTA HEAD Anterior circulation: Patent internal carotid arteries bilaterally. Severe stenosis RIGHT supraclinoid internal carotid artery due to calcific atherosclerosis, mild stenosis LEFT carotid siphon. Normal appearance of the anterior cerebral arteries with patent anterior communicating artery. Moderate stenosis RIGHT M1 segment. Mild luminal irregularity of the remaining vessels  compatible with atherosclerosis. Posterior circulation: Faint contrast opacification of RIGHT V4 segment proximally, occluded distal RIGHT V4 segment. Mild luminal irregularity due to calcific atherosclerosis the LEFT vertebral artery. Focal severe stenosis proximal basilar artery with mild poststenotic dilatation. Moderate stenosis distal basilar artery. Tandem severe stenosis LEFT posterior cerebral artery including P1 and P2 segments. Moderate stenosis of RIGHT P2 segment. No large vessel occlusion, contrast extravasation or aneurysm within the anterior nor posterior circulation. IMPRESSION: CT HEAD: Known acute LEFT temporoparietal lobe infarcts not apparent by CT. Chronic changes including old LEFT basal ganglia lacunar infarct and moderate chronic small vessel ischemic disease. 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 Atherosclerosis without hemodynamically significant stenosis of the carotid arteries. CTA HEAD: Stable angiographic appearance of the brain: Chronically occluded distal RIGHT V4 segment. High-grade stenosis proximal basilar artery. Tandem high-grade stenoses LEFT posterior cerebral artery. Moderate stenosis RIGHT P2 segment. Moderate stenosis RIGHT M1 segment. Electronically Signed   By: Awilda Metro M.D.   On: 01/12/2016 23:03   Mr [name] Wo Contrast  01/06/2016  CLINICAL DATA:  81 year old male with worsening visual disturbance. Multiple punctate infarcts in the right occipital lobe on MRI. Abnormal distal  right vertebral artery on MRI. Initial encounter. EXAM: MRA HEAD WITHOUT CONTRAST TECHNIQUE: Angiographic images of the Circle of Willis were obtained using MRA technique without intravenous contrast. COMPARISON:  Brain MRI 0303 hours today. Brain MRI and MRA 03/04/2007. FINDINGS: The left vertebral artery is chronically dominant. Since 2008, the right vertebral artery has occluded just distal to the right PICA origin. The  right PICA remains patent (series 305, image 14). No stenosis of the distal left vertebral artery which supplies the basilar. The left PICA remains patent. Irregularity in the proximal third of the basilar artery has progressed and there is now moderate to severe stenosis (series 305, image 14). The basilar remains patent. Mild to moderate distal basilar irregularity persists, but without stenosis. Both SCA origins remain patent. The right PCA origin is stable. Right PCA branches have not significantly changed. There is a new severe stenosis at the left PCA origin (series 305, image 14). The left PCA remains patent. There is superimposed new moderate irregularity and stenosis in the left PCA P2 segment. Preserved distal left PCA flow. Posterior communicating arteries are diminutive or absent. Stable antegrade flow in both ICA siphons. Both carotid termini remain patent. No siphon stenosis identified. Normal ophthalmic artery origins. ACA origins remain normal. There is new mild stenosis at both MCA origins. Visualized bilateral MCA branches and visualized bilateral ACA branches appear stable and within normal limits. IMPRESSION: 1. The Right PCA is within normal limits. 2. Severe proximal Basilar artery stenosis, and severe Left PCA origin stenosis have developed since 2008. 3. 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 Right PICA remains patent. The left vertebral artery is chronically dominant. 4. The anterior circulation is more stable since 2008, with only mild bilateral MCA origin stenoses. Electronically Signed   By: Odessa Fleming M.D.   On: 01/06/2016 07:33   [name]  01/12/2016  CLINICAL DATA:  Atrial fibrillation. Migraine headache. Recent stroke and TIA. Right arm numbness and right leg weakness. EXAM: MRI HEAD WITHOUT CONTRAST TECHNIQUE: Multiplanar, multiecho pulse sequences of the brain and surrounding structures were obtained without intravenous  contrast. COMPARISON:  Head CT same day.  MRI 01/06/2016. FINDINGS: Few small punctate foci of acute infarctions seen on the study of 6 days ago of largely lost their restricted diffusion. However, there are several new punctate foci of acute infarction, most prominently clustered in the left temporal lobe but also seen in the right posterior parietal lobe. These are again consistent with micro embolic infarctions. No large vessel territory infarction. No mass lesion, hemorrhage, hydrocephalus or extra-axial collection. Chronic small-vessel ischemic changes present elsewhere throughout the brain. Old left basal ganglia infarction. Old focus of hemosiderin deposition associated with a few of the old small vessel infarctions. No pituitary mass. No inflammatory sinus disease. No skull or skullbase lesion. Major vessels at the base of the brain show flow. IMPRESSION: New punctate foci of acute infarction in the left temporal lobe and right posterior parietal lobe. These are again consistent with micro embolic infarctions. No swelling, mass effect or acute hemorrhage. Extensive old small vessel insults elsewhere throughout the brain, some associated with hemosiderin deposition. Electronically Signed   By: Paulina Fusi M.D.   On: 01/12/2016 15:52   [name]  01/06/2016  CLINICAL DATA:  Worsening intermittent visual disturbances for 6 days. Intermittent head fullness, nasal congestion and tinnitus. History of migraines, hypertension, hyperlipidemia. EXAM: MRI HEAD WITHOUT CONTRAST TECHNIQUE: Multiplanar, multiecho pulse  sequences of the brain and surrounding structures were obtained without intravenous contrast. COMPARISON:  CT head February 29, 2012 and MRI of the head March 04, 2007 FINDINGS: INTRACRANIAL CONTENTS: At least 3 foci of reduced diffusion RIGHT occipital lobe measure up to 4 mm, difficult to localize on ADC map due to small size. Small foci of susceptibility artifact RIGHT insula, LEFT mesial  thalamus. The ventricles and sulci are normal for patient's age. Patchy to confluent supratentorial white matter FLAIR T2 hyperintensities. Old small RIGHT cerebellar, LEFT pontine infarcts. Old bilateral basal ganglia lacunar infarcts. No suspicious parenchymal signal, mass lesions, mass effect. No abnormal extra-axial fluid collections. No extra-axial masses though, contrast enhanced sequences would be more sensitive. Focal loss of RIGHT V4 flow void. dolicoectatic intracranial vessels. ORBITS: The included ocular globes and orbital contents are non-suspicious. SINUSES: Small RIGHT maxillary mucosal retention cysts, small LEFT frontal sinus mucosal retention cyst. The mastoid air-cells and included paranasal sinuses are otherwise well-aerated. SKULL/SOFT TISSUES: No abnormal sellar expansion. No suspicious calvarial bone marrow signal. Craniocervical junction maintained. IMPRESSION: At least 3 subcentimeter foci of probable acute ischemia RIGHT occipital lobe. Focal loss of RIGHT vertebral artery V4 flow void concerning for focal occlusion. Moderate to severe chronic small vessel ischemic disease. Old small RIGHT cerebellar and LEFT pontine infarcts. Old bilateral basal ganglia lacunar infarcts. Electronically Signed   By: Awilda Metro M.D.   On: 01/06/2016 03:53   Ct Head Code Stroke W/o Cm  01/12/2016  CLINICAL DATA:  Bilateral leg weakness. Progression to right leg numbness. EXAM: CT HEAD WITHOUT CONTRAST TECHNIQUE: Contiguous axial images were obtained from the base of the skull through the vertex without intravenous contrast. COMPARISON:  Brain MRI from 6 days ago FINDINGS: Skull and Sinuses:Negative for fracture or destructive process. Visualized orbits: Negative. Brain: No evidence of acute infarction, hemorrhage, hydrocephalus, or mass lesion/mass effect. Small occipital cortex infarcts described on comparison brain MRI are not visible by CT. No evidence of infarct extension or hemorrhagic  conversion. Chronic microvascular disease with remote left caudate head lacunar infarct. Critical Value/emergent results were called by telephone at the time of interpretation on 01/12/2016 at 3:25 am to Dr. Roseanne Reno, who verbally acknowledged these results. IMPRESSION: 1. No acute finding. 2. Small cortical infarcts seen on brain MRI 6 days prior are not visible. No hemorrhagic conversion or evidence of infarct extension. 3. Chronic microvascular disease. Electronically Signed   By: Marnee Spring M.D.   On: 01/12/2016 03:27    CBC  Recent Labs Lab 01/12/16 0220 01/12/16 0320 01/13/16 0510 01/14/16 0223  WBC 8.3  --  7.6 8.3  HGB 14.6 16.0 13.6 13.7  HCT 42.6 47.0 39.8 40.5  PLT 250  --  215 217  MCV 89.5  --  89.4 90.0  MCH 30.7  --  30.6 30.4  MCHC 34.3  --  34.2 33.8  RDW 13.2  --  13.2 13.3  LYMPHSABS 2.7  --   --   --   MONOABS 0.7  --   --   --   EOSABS 0.3  --   --   --   BASOSABS 0.0  --   --   --     Chemistries   Recent Labs Lab 01/12/16 0220 01/12/16 0320 01/13/16 0510 01/14/16 0223  NA 133* 135 135 135  K 3.1* 3.1* 3.3* 3.8  CL 96* 95* 101 101  CO2 22  --  21* 23  GLUCOSE 113* 114* 97 102*  BUN 22* 24* 25*  24*  CREATININE 1.65* 1.50* 1.34* 1.35*  CALCIUM 8.9  --  8.6* 8.9  MG  --   --  1.9  --   AST 26  --   --   --   ALT 20  --   --   --   ALKPHOS 98  --   --   --   BILITOT 0.6  --   --   --    ------------------------------------------------------------------------------------------------------------------ estimated creatinine clearance is 54.2 mL/min (by C-G formula based on Cr of 1.35). ------------------------------------------------------------------------------------------------------------------ No results for input(s): HGBA1C in the last 72 hours. ------------------------------------------------------------------------------------------------------------------ No results for input(s): CHOL, HDL, LDLCALC, TRIG, CHOLHDL, LDLDIRECT in the last 72  hours. ------------------------------------------------------------------------------------------------------------------  Recent Labs  01/13/16 1221  TSH 3.397   ------------------------------------------------------------------------------------------------------------------  Recent Labs  01/13/16 1221  VITAMINB12 296  FOLATE 20.0    Coagulation profile  Recent Labs Lab 01/12/16 0312  INR 2.78*    No results for input(s): DDIMER in the last 72 hours.  Cardiac Enzymes No results for input(s): CKMB, TROPONINI, MYOGLOBIN in the last 168 hours.  Invalid input(s): CK ------------------------------------------------------------------------------------------------------------------ Invalid input(s): POCBNP   Recent Labs  01/12/16 0333 01/12/16 2127  GLUCAP 107* 135*     Taylore Hinde M.D. [facility] 01/14/2016, 1:17 PM  Pager: [phone] Between 7am to 7pm - call Pager - [phone]  After 7pm go to [email or web address] - password [email or web address]  Call night coverage person covering after 7pm

## 2016-01-15 LAB — CBC
HEMATOCRIT: 39.4 % (ref 39.0–52.0)
HEMOGLOBIN: 13.1 g/dL (ref 13.0–17.0)
MCH: 30.5 pg (ref 26.0–34.0)
MCHC: 33.2 g/dL (ref 30.0–36.0)
MCV: 91.6 fL (ref 78.0–100.0)
Platelets: 196 10*3/uL (ref 150–400)
RBC: 4.3 MIL/uL (ref 4.22–5.81)
RDW: 13.5 % (ref 11.5–15.5)
WBC: 7.7 10*3/uL (ref 4.0–10.5)

## 2016-01-15 LAB — BASIC METABOLIC PANEL
Anion gap: 11 (ref 5–15)
BUN: 21 mg/dL — AB (ref 6–20)
CHLORIDE: 103 mmol/L (ref 101–111)
CO2: 23 mmol/L (ref 22–32)
Calcium: 8.8 mg/dL — ABNORMAL LOW (ref 8.9–10.3)
Creatinine, Ser: 1.49 mg/dL — ABNORMAL HIGH (ref 0.61–1.24)
GFR calc Af Amer: 50 mL/min — ABNORMAL LOW (ref >60)
GFR calc non Af Amer: 43 mL/min — ABNORMAL LOW (ref >60)
Glucose, Bld: 104 mg/dL — ABNORMAL HIGH (ref 65–99)
POTASSIUM: 3.5 mmol/L (ref 3.5–5.1)
SODIUM: 137 mmol/L (ref 135–145)

## 2016-01-15 LAB — GLUCOSE, CAPILLARY: GLUCOSE-CAPILLARY: 100 mg/dL — AB (ref 65–99)

## 2016-01-15 MED ORDER — LOSARTAN POTASSIUM 50 MG PO TABS: 50.0000 mg | ORAL_TABLET | Freq: Every day | ORAL | Status: DC

## 2016-01-15 NOTE — Progress Notes (Signed)
Patient ready for discharge to home; discharge instructions given and reviewed; Rx given and reviewed; patient discharged home with his wife and accompanied by his daughter.

## 2016-01-15 NOTE — Progress Notes (Signed)
CCDM calls periodic to report patient in SB. Patient has continuous sinus brady heart monitoring levels for past few days. Patient asymptic. MD and NP is aware. CCDM advised not to silence alarms when asked for patient safety and RN notification.

## 2016-01-15 NOTE — Discharge Summary (Signed)
Physician Discharge Summary   Patient ID: Adrian Melendez MRN: [id number] DOB/AGE: [birth year] 81 y.o.  Admit date: 01/12/2016 Discharge date: 01/15/2016  Primary Care Physician:  Garlan Fillers, MD  Discharge Diagnoses:   . Acute CVA (cerebrovascular accident) (HCC) . HTN (hypertension) . PAF (paroxysmal atrial fibrillation) by Holter June 2012With bradycardia  . CKD (chronic kidney disease), stage III . HLD (hyperlipidemia) . Hypokalemia    Consults: Neurology, Dr Roda Shutters Cardiology  Recommendations for Outpatient Follow-up:  1. Home health PT, OT was recommended 2. Please repeat CBC/BMET at next visit   DIET: Heart healthy diet    Allergies:   Allergies  Allergen Reactions  . Statins     Cause leg weakness      DISCHARGE MEDICATIONS: Discharge Medication List as of 01/15/2016  8:02 AM    START taking these medications   Details  losartan (COZAAR) 50 MG tablet Take 1 tablet (50 mg total) by mouth daily., Starting 01/15/2016, Until Discontinued, Print      CONTINUE these medications which have NOT CHANGED   Details  allopurinol (ZYLOPRIM) 300 MG tablet Take 450 mg by mouth daily., Until Discontinued, Historical Med    aspirin 81 MG chewable tablet Chew 1 tablet (81 mg total) by mouth daily., Starting 01/07/2016, Until Discontinued, No Print    divalproex (DEPAKOTE ER) 500 MG 24 hr tablet Take 1 tablet (500 mg total) by mouth daily., Starting 01/07/2016, Until Discontinued, Print    rivaroxaban (XARELTO) 20 MG TABS tablet TAKE 1 TABLET BY MOUTH DAILY WITH SUPPER., Normal    rosuvastatin (CRESTOR) 20 MG tablet Take 1 tablet (20 mg total) by mouth daily., Starting 01/07/2016, Until Discontinued, Print      STOP taking these medications     amLODipine (NORVASC) 5 MG tablet      potassium chloride SA (K-DUR,KLOR-CON) 20 MEQ tablet          Brief H and P: For complete details please refer to admission H and P, but in brief Briefly Adrian Melendez is a  81 y.o. male with atrial fibrillation on Xarelto, CKDstage III, hypertension, hyperlipidemia, TIA, migraine headache, recent CVA, who presents with right arm numbness and right leg weakness.  Patient was recently hospitalized from 4/20-4/21 and diagnosed with right occipital infarct (suspect is as migrainous infarct, could be related to atherosclerosis). Patient was discharged on aspirin, Crestor and Keppra. Patient presented with weakness in his legs for the past 4 days and right hand tingling and numbness since 1 AM on the day of admission. In ED, he had one episode of right leg weakness. All symptoms resolved spontaneously. MRI on 01/06/2016 showed multiple subcentimeter and occipital infarctions and MRA showed severe proximal basal artery stenosis with severe left PCA stenosis at the origin.  Hospital Course:   CVA/TIA with a recent history of CVA - Neurology was consulted and recommended stroke workup,  - MRI showed new punctate foci of acute infarction in the left temporal lobe and right posterior parietal lobe consistent with microembolic infarctions - did not repeat 2-D echo or carotid Dopplers, had a recent thorough workup - Continue xarelto, statin - Outpatient follow-up with Dr. Pearlean Brownie  Orthostatic Hypotension with history of Hypertension - BP still intermittently high or low, continue to hold micardis, amlodipine  - Orthostatic, likely due to posterior circulation basilar arteries stenosis, patient was placed on IV fluids, thigh high ted hoses - cortisol level, B12, folate normal - HCTZ was discontinued from patient's medications - He was recommended to continue  losartan with a goal SBP of 130 to 160. HCTZ was discontinued.  Atrial fibrillation with Bradycardia heart rate persisting in 30s to 40s intermittently - Not on any rate controlling medications, discontinued amlodipine also - 2-D echo 4/21 had shown EF of 60-65% with grade 1 diastolic dysfunction, no aortic stenosis -  CHADS VASC 5, continue xarelto - Appreciate cardiology conditions, no indication of pacemaker, TSH 3.3  Mild acute on CKD (chronic kidney disease), stage III: Baseline creatinine 1.35-1.44, his creatinine is 1.65 -Creatinine function improving, Micardis was discontinued, patient was placed on gentle hydration. Creatinine back to baseline 1.4.  HLD: Last LDL was 158/21/17  -Continue home medications: Continue Crestor  Retinal acephalgic migraines:  -Patient was started on Depakote by neurology during previous admission, will continue   Day of Discharge BP 139/57 mmHg  Pulse 53  Temp(Src) 97.9 F (36.6 C) (Oral)  Resp 16  Ht 5\' 11"  (1.803 m)  Wt 99.5 kg (219 lb 5.7 oz)  BMI 30.61 kg/m2  SpO2 95%  Physical Exam: General: Alert and awake oriented x3 not in any acute distress. HEENT: anicteric sclera, pupils reactive to light and accommodation CVS: S1-S2 clear no murmur rubs or gallops Chest: clear to auscultation bilaterally, no wheezing rales or rhonchi Abdomen: soft nontender, nondistended, normal bowel sounds Extremities: no cyanosis, clubbing or edema noted bilaterally Neuro: Cranial nerves II-XII intact, no focal neurological deficits   The results of significant diagnostics from this hospitalization (including imaging, microbiology, ancillary and laboratory) are listed below for reference.    LAB RESULTS: Basic Metabolic Panel:  Recent Labs Lab 01/13/16 0510 01/14/16 0223 01/15/16 0431  NA 135 135 137  K 3.3* 3.8 3.5  CL 101 101 103  CO2 21* 23 23  GLUCOSE 97 102* 104*  BUN 25* 24* 21*  CREATININE 1.34* 1.35* 1.49*  CALCIUM 8.6* 8.9 8.8*  MG 1.9  --   --    Liver Function Tests:  Recent Labs Lab 01/12/16 0220  AST 26  ALT 20  ALKPHOS 98  BILITOT 0.6  PROT 7.0  ALBUMIN 3.9   No results for input(s): LIPASE, AMYLASE in the last 168 hours. No results for input(s): AMMONIA in the last 168 hours. CBC:  Recent Labs Lab 01/12/16 0220  01/14/16 0223  01/15/16 0431  WBC 8.3  < > 8.3 7.7  NEUTROABS 4.6  --   --   --   HGB 14.6  < > 13.7 13.1  HCT 42.6  < > 40.5 39.4  MCV 89.5  < > 90.0 91.6  PLT 250  < > 217 196  < > = values in this interval not displayed. Cardiac Enzymes: No results for input(s): CKTOTAL, CKMB, CKMBINDEX, TROPONINI in the last 168 hours. BNP: Invalid input(s): POCBNP CBG:  Recent Labs Lab 01/14/16 2211 01/15/16 0625  GLUCAP 97 100*    Significant Diagnostic Studies:  Ct Angio Head W/cm &/or Wo Cm  01/12/2016  CLINICAL DATA:  Follow-up code stroke. Bilateral leg weakness, RIGHT hand tingling. History of RIGHT occipital lobe infarct, hypertension, dizziness, migraines. EXAM: CT ANGIOGRAPHY HEAD AND NECK TECHNIQUE: Multidetector CT imaging of the head and neck was performed using the standard protocol during bolus administration of intravenous contrast. Multiplanar CT image reconstructions and MIPs were obtained to evaluate the vascular anatomy. Carotid stenosis measurements (when applicable) are obtained utilizing NASCET criteria, using the distal internal carotid diameter as the denominator. CONTRAST:  50 cc Isovue 370 COMPARISON:  MRI head January 12, 2016 at 1330 hours and  MRA head January 06, 2016 FINDINGS: CT HEAD: INTRACRANIAL CONTENTS: No intraparenchymal hemorrhage, mass effect nor midline shift. Patchy supratentorial white matter hypodensities are within normal range for patient's age and though non-specific likely represent chronic small vessel ischemic disease. No acute large vascular territory infarcts. Old LEFT basal ganglia lacunar infarct with mild ex vacuo dilatation frontal horn of LEFT lateral ventricle. No abnormal extra-axial fluid collections. Basal cisterns are patent. Moderate calcific atherosclerosis of the carotid siphons. No abnormal intracranial enhancement. ORBITS: The included ocular globes and orbital contents are non-suspicious. SINUSES: Small RIGHT maxillary mucosal retention cyst. Small LEFT  frontal sinus mucosal retention cyst. Mastoid air cells are well aerated. SKULL/SOFT TISSUES: No skull fracture. No significant soft tissue swelling. CTA NECK Aortic arch: Normal appearance of the thoracic arch, normal branch pattern. Mild calcific atherosclerosis of the aortic arch. The origins of the innominate, left Common carotid artery and subclavian artery are widely patent. Irregular intimal thickening LEFT subclavian artery origin. Right carotid system: Common carotid artery is widely patent, coursing in a straight line fashion. Normal appearance of the carotid bifurcation without hemodynamically significant stenosis by NASCET criteria. Mild eccentric calcific atherosclerosis. Normal appearance of the included internal carotid artery. Left carotid system: Common carotid artery is widely patent, coursing in a straight line fashion. Mild intimal thickening calcific atherosclerosis. Normal appearance of the carotid bifurcation without hemodynamically significant stenosis by NASCET criteria. Mild intimal thickening and calcific atherosclerosis. Normal appearance of the included internal carotid artery. Vertebral arteries:LEFT vertebral artery is dominant. Severe luminal irregularity of the RIGHT vertebral artery throughout the course, patent through RIGHT V3 segment. Skeleton: No acute osseous process though bone windows have not been submitted. Bulky ventral cervical spine osteophytes compatible with DISH deform the posterior pharynx. Other neck: Soft tissues of the neck are non-acute though, not tailored for evaluation. CTA HEAD Anterior circulation: Patent internal carotid arteries bilaterally. Severe stenosis RIGHT supraclinoid internal carotid artery due to calcific atherosclerosis, mild stenosis LEFT carotid siphon. Normal appearance of the anterior cerebral arteries with patent anterior communicating artery. Moderate stenosis RIGHT M1 segment. Mild luminal irregularity of the remaining vessels compatible  with atherosclerosis. Posterior circulation: Faint contrast opacification of RIGHT V4 segment proximally, occluded distal RIGHT V4 segment. Mild luminal irregularity due to calcific atherosclerosis the LEFT vertebral artery. Focal severe stenosis proximal basilar artery with mild poststenotic dilatation. Moderate stenosis distal basilar artery. Tandem severe stenosis LEFT posterior cerebral artery including P1 and P2 segments. Moderate stenosis of RIGHT P2 segment. No large vessel occlusion, contrast extravasation or aneurysm within the anterior nor posterior circulation. IMPRESSION: CT HEAD: Known acute LEFT temporoparietal lobe infarcts not apparent by CT. Chronic changes including old LEFT basal ganglia lacunar infarct and moderate chronic small vessel ischemic disease. 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 Atherosclerosis without hemodynamically significant stenosis of the carotid arteries. CTA HEAD: Stable angiographic appearance of the brain: Chronically occluded distal RIGHT V4 segment. High-grade stenosis proximal basilar artery. Tandem high-grade stenoses LEFT posterior cerebral artery. Moderate stenosis RIGHT P2 segment. Moderate stenosis RIGHT M1 segment. Electronically Signed   By: Awilda Metroourtnay  Bloomer M.D.   On: 01/12/2016 23:03   Ct Angio Neck W/cm &/or Wo/cm  01/12/2016  CLINICAL DATA:  Follow-up code stroke. Bilateral leg weakness, RIGHT hand tingling. History of RIGHT occipital lobe infarct, hypertension, dizziness, migraines. EXAM: CT ANGIOGRAPHY HEAD AND NECK TECHNIQUE: Multidetector CT imaging of the head and neck was performed using the standard protocol during bolus administration of intravenous contrast.  Multiplanar CT image reconstructions and MIPs were obtained to evaluate the vascular anatomy. Carotid stenosis measurements (when applicable) are obtained utilizing NASCET criteria, using the distal internal carotid diameter as the  denominator. CONTRAST:  50 cc Isovue 370 COMPARISON:  MRI head January 12, 2016 at 1330 hours and MRA head January 06, 2016 FINDINGS: CT HEAD: INTRACRANIAL CONTENTS: No intraparenchymal hemorrhage, mass effect nor midline shift. Patchy supratentorial white matter hypodensities are within normal range for patient's age and though non-specific likely represent chronic small vessel ischemic disease. No acute large vascular territory infarcts. Old LEFT basal ganglia lacunar infarct with mild ex vacuo dilatation frontal horn of LEFT lateral ventricle. No abnormal extra-axial fluid collections. Basal cisterns are patent. Moderate calcific atherosclerosis of the carotid siphons. No abnormal intracranial enhancement. ORBITS: The included ocular globes and orbital contents are non-suspicious. SINUSES: Small RIGHT maxillary mucosal retention cyst. Small LEFT frontal sinus mucosal retention cyst. Mastoid air cells are well aerated. SKULL/SOFT TISSUES: No skull fracture. No significant soft tissue swelling. CTA NECK Aortic arch: Normal appearance of the thoracic arch, normal branch pattern. Mild calcific atherosclerosis of the aortic arch. The origins of the innominate, left Common carotid artery and subclavian artery are widely patent. Irregular intimal thickening LEFT subclavian artery origin. Right carotid system: Common carotid artery is widely patent, coursing in a straight line fashion. Normal appearance of the carotid bifurcation without hemodynamically significant stenosis by NASCET criteria. Mild eccentric calcific atherosclerosis. Normal appearance of the included internal carotid artery. Left carotid system: Common carotid artery is widely patent, coursing in a straight line fashion. Mild intimal thickening calcific atherosclerosis. Normal appearance of the carotid bifurcation without hemodynamically significant stenosis by NASCET criteria. Mild intimal thickening and calcific atherosclerosis. Normal appearance of the  included internal carotid artery. Vertebral arteries:LEFT vertebral artery is dominant. Severe luminal irregularity of the RIGHT vertebral artery throughout the course, patent through RIGHT V3 segment. Skeleton: No acute osseous process though bone windows have not been submitted. Bulky ventral cervical spine osteophytes compatible with DISH deform the posterior pharynx. Other neck: Soft tissues of the neck are non-acute though, not tailored for evaluation. CTA HEAD Anterior circulation: Patent internal carotid arteries bilaterally. Severe stenosis RIGHT supraclinoid internal carotid artery due to calcific atherosclerosis, mild stenosis LEFT carotid siphon. Normal appearance of the anterior cerebral arteries with patent anterior communicating artery. Moderate stenosis RIGHT M1 segment. Mild luminal irregularity of the remaining vessels compatible with atherosclerosis. Posterior circulation: Faint contrast opacification of RIGHT V4 segment proximally, occluded distal RIGHT V4 segment. Mild luminal irregularity due to calcific atherosclerosis the LEFT vertebral artery. Focal severe stenosis proximal basilar artery with mild poststenotic dilatation. Moderate stenosis distal basilar artery. Tandem severe stenosis LEFT posterior cerebral artery including P1 and P2 segments. Moderate stenosis of RIGHT P2 segment. No large vessel occlusion, contrast extravasation or aneurysm within the anterior nor posterior circulation. IMPRESSION: CT HEAD: Known acute LEFT temporoparietal lobe infarcts not apparent by CT. Chronic changes including old LEFT basal ganglia lacunar infarct and moderate chronic small vessel ischemic disease. 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 Atherosclerosis without hemodynamically significant stenosis of the carotid arteries. CTA HEAD: Stable angiographic appearance of the brain: Chronically occluded distal RIGHT V4 segment. High-grade stenosis  proximal basilar artery. Tandem high-grade stenoses LEFT posterior cerebral artery. Moderate stenosis RIGHT P2 segment. Moderate stenosis RIGHT M1 segment. Electronically Signed   By: Awilda Metro M.D.   On: 01/12/2016 23:03   [name]  01/12/2016  CLINICAL DATA:  Atrial fibrillation. Migraine headache. Recent stroke and TIA. Right arm numbness and right leg weakness. EXAM: MRI HEAD WITHOUT CONTRAST TECHNIQUE: Multiplanar, multiecho pulse sequences of the brain and surrounding structures were obtained without intravenous contrast. COMPARISON:  Head CT same day.  MRI 01/06/2016. FINDINGS: Few small punctate foci of acute infarctions seen on the study of 6 days ago of largely lost their restricted diffusion. However, there are several new punctate foci of acute infarction, most prominently clustered in the left temporal lobe but also seen in the right posterior parietal lobe. These are again consistent with micro embolic infarctions. No large vessel territory infarction. No mass lesion, hemorrhage, hydrocephalus or extra-axial collection. Chronic small-vessel ischemic changes present elsewhere throughout the brain. Old left basal ganglia infarction. Old focus of hemosiderin deposition associated with a few of the old small vessel infarctions. No pituitary mass. No inflammatory sinus disease. No skull or skullbase lesion. Major vessels at the base of the brain show flow. IMPRESSION: New punctate foci of acute infarction in the left temporal lobe and right posterior parietal lobe. These are again consistent with micro embolic infarctions. No swelling, mass effect or acute hemorrhage. Extensive old small vessel insults elsewhere throughout the brain, some associated with hemosiderin deposition. Electronically Signed   By: Paulina Fusi M.D.   On: 01/12/2016 15:52   Ct Head Code Stroke W/o Cm  01/12/2016  CLINICAL DATA:  Bilateral leg weakness. Progression to right leg numbness. EXAM: CT HEAD WITHOUT  CONTRAST TECHNIQUE: Contiguous axial images were obtained from the base of the skull through the vertex without intravenous contrast. COMPARISON:  Brain MRI from 6 days ago FINDINGS: Skull and Sinuses:Negative for fracture or destructive process. Visualized orbits: Negative. Brain: No evidence of acute infarction, hemorrhage, hydrocephalus, or mass lesion/mass effect. Small occipital cortex infarcts described on comparison brain MRI are not visible by CT. No evidence of infarct extension or hemorrhagic conversion. Chronic microvascular disease with remote left caudate head lacunar infarct. Critical Value/emergent results were called by telephone at the time of interpretation on 01/12/2016 at 3:25 am to Dr. Roseanne Reno, who verbally acknowledged these results. IMPRESSION: 1. No acute finding. 2. Small cortical infarcts seen on brain MRI 6 days prior are not visible. No hemorrhagic conversion or evidence of infarct extension. 3. Chronic microvascular disease. Electronically Signed   By: Marnee Spring M.D.   On: 01/12/2016 03:27    2D ECHO:   Disposition and Follow-up: Discharge Instructions    Diet - low sodium heart healthy    Complete by:  As directed      Discharge instructions    Complete by:  As directed   Please check your BP 2-3 times daily. If consistently running above 140, may start Losartan.     Increase activity slowly    Complete by:  As directed             DISPOSITION:Home with home health   DISCHARGE FOLLOW-UP Follow-up Information    Follow up with SETHI,PRAMOD, MD. Go on 03/20/2016.   Specialties:  Neurology, Radiology   Contact information:   [address] [place] [address] [phone]       Follow up with Garlan Fillers, MD. Schedule an appointment as soon as possible for a visit in 10 days.   Specialty:  Internal Medicine   Why:  for hospital follow-up   Contact information:   [address] [place] [address] [phone]        Time  spent  on Discharge: 25 minutes  Signed:   RAI,RIPUDEEP M.D. [facility] 01/15/2016, 1:55 PM Pager: [phone]

## 2016-01-20 ENCOUNTER — Ambulatory Visit (INDEPENDENT_AMBULATORY_CARE_PROVIDER_SITE_OTHER): Payer: Medicare Other | Admitting: Cardiovascular Disease

## 2016-01-20 ENCOUNTER — Encounter: Payer: Self-pay | Admitting: Cardiovascular Disease

## 2016-01-20 VITALS — BP 165/87 | HR 65 | Ht 71.0 in | Wt 216.0 lb

## 2016-01-20 DIAGNOSIS — I10 Essential (primary) hypertension: Secondary | ICD-10-CM | POA: Diagnosis not present

## 2016-01-20 DIAGNOSIS — I480 Paroxysmal atrial fibrillation: Secondary | ICD-10-CM

## 2016-01-20 DIAGNOSIS — E785 Hyperlipidemia, unspecified: Secondary | ICD-10-CM

## 2016-01-20 DIAGNOSIS — Z79899 Other long term (current) drug therapy: Secondary | ICD-10-CM | POA: Diagnosis not present

## 2016-01-20 NOTE — Assessment & Plan Note (Signed)
History of PAF on oral anticoagulant. He's had slow ventricular response the past and AV nodal blocking agents were discontinued. On telemetry he had2 second pauses which were not thought to be significant. His telemetry strips were reviewed with the EP service who did not think he required permanent transvenous pacing.

## 2016-01-20 NOTE — Patient Instructions (Signed)
Medication Instructions:  Your physician recommends that you continue on your current medications as directed. Please refer to the Current Medication list given to you today.   Labwork: Your physician recommends that you return for lab work in: May 8th - (bmet) The lab can be found on the FIRST FLOOR of out building in [address]   Testing/Procedures: none  Follow-Up: Your physician recommends that you schedule a follow-up appointment in: 4 weeks in [facility]  We request that you follow-up in: 3 months with an extender and in 6 months with Dr San MorelleBerry  You will receive a reminder letter in the mail two months in advance. If you don't receive a letter, please call our office to schedule the follow-up appointment.    Any Other Special Instructions Will Be Listed Below (If Applicable).  Dr. Allyson SabalBerry would like you to check your blood pressure DAILY for the next 4 weeks.  Keep a journal of these daily BP and heart rate reading and call our office with the results.     If you need a refill on your cardiac medications before your next appointment, please call your pharmacy.

## 2016-01-20 NOTE — Progress Notes (Signed)
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 His risk factors include hypertension, hyperlipidemia intolerant to statin drugs. He had an episode of presyncope requiring hospitalization back in June. A 2D echo was normal, as was a head CT. A monitor showed PVCs. He was begun on a low dose beta blocker. Myoview performed in our office March 28, 2012, was nonischemic. However, event monitor did show PAF with fairly long pauses which would certainly explain his symptoms. I elected to put him on Xarelto and discontinued his beta blocker.he was hospitalized 01/06/16 for one day and was thought to have a migrainous stroke.he was readmitted several days later with recurrent neurologic symptoms. 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 He was seen by the neurology service who briefly discuss possible intervention but later thought that conservative therapy was more appropriate. He was also seen in consult by cardiology service (Dr. Zachery Conch blood pressure medicines were adjusted because of relative hypotension.  Current Outpatient Prescriptions  Medication Sig Dispense Refill  . allopurinol (ZYLOPRIM) 300 MG tablet Take 450 mg by mouth daily.    [name] aspirin 81 MG chewable tablet Chew 1 tablet (81 mg total) by mouth daily.    . divalproex (DEPAKOTE ER) 500 MG 24 hr tablet Take 1 tablet (500 mg total) by mouth daily. 30 tablet 0  . rivaroxaban (XARELTO) 20 MG TABS tablet TAKE 1 TABLET BY MOUTH DAILY WITH SUPPER. 30 tablet 5  . rosuvastatin (CRESTOR) 20 MG tablet Take 1 tablet (20 mg  total) by mouth daily. 30 tablet 0   No current facility-administered medications for this visit.    Allergies  Allergen Reactions  . Statins     Cause leg weakness     Social History   Social History  . Marital Status: Married    Spouse Name: N/A  . Number of Children: 1  . 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 Smoking status: Never Smoker   . Smokeless tobacco: Never Used  . Alcohol Use: No  . Drug Use: No  . Sexual Activity: Yes   Other Topics Concern  . Not on file   Social History Narrative   Consumes no caffeine     Review of Systems: General: negative for chills, fever, night sweats or weight changes.  Cardiovascular: negative for chest pain, dyspnea on exertion, edema, orthopnea, palpitations, paroxysmal nocturnal dyspnea or shortness of breath Dermatological: negative for rash Respiratory: negative for cough or wheezing Urologic: negative for hematuria Abdominal: negative for nausea, vomiting, diarrhea, bright red blood per rectum, melena, or hematemesis Neurologic: negative for visual changes, syncope, or dizziness All other systems reviewed and are otherwise negative except as noted above.    Blood pressure 165/87, pulse 65, height  (1.803 m), weight 216 lb (97.977 kg).  General appearance: alert and no distress Neck: no adenopathy, no carotid bruit, no JVD, supple, symmetrical, trachea midline and thyroid not enlarged, symmetric, no tenderness/mass/nodules Lungs: clear to auscultation bilaterally Heart: regular rate and rhythm, S1, S2 normal, no murmur, click,  rub or gallop Extremities: extremities normal, atraumatic, no cyanosis or edema  EKG not performed today  ASSESSMENT AND PLAN:   HTN (hypertension) History of hypertension with blood pressure measured today at 165/87. I reviewed blood pressure measurements at home which are primarily in the 130-140  range with  occasional readings of 150. His amlodipine, Micardis and hard core thiazide were recently discontinued during his recent hospitalization. I'm going to check a basic panel because of recent hypokalemia. I definitely a blood pressure log of daily basis. Will follow up with Belenda CruiseKristin in 4 weeks to review this. He may need to have some blood pressure medicines reinstituted depending on his readings.  Dyslipidemia- statin intol History of hyperlipidemia with statin intolerance in the past. He is very slowly restarted on Crestor. Lipid profile performed 01/07/16 revealed total cholesterol 216, LDL 158 and HDL of 31  PAF (paroxysmal atrial fibrillation) by Holter June 2012 History of PAF on oral anticoagulant. He's had slow ventricular response the past and AV nodal blocking agents were discontinued. On telemetry he had2 second pauses which were not thought to be significant. His telemetry strips were reviewed with the EP service who did not think he required permanent transvenous pacing.      Runell GessJonathan J. Parker Sawatzky MD FACP,FACC,FAHA, [facility] 01/20/2016 5:01 PM

## 2016-01-20 NOTE — Assessment & Plan Note (Signed)
History of hypertension with blood pressure measured today at 165/87. I reviewed blood pressure measurements at home which are primarily in the 130-140  range with occasional readings of 150. His amlodipine, Micardis and hard core thiazide were recently discontinued during his recent hospitalization. I'm going to check a basic panel because of recent hypokalemia. I definitely a blood pressure log of daily basis. Will follow up with Adrian Melendez in 4 weeks to review this. He may need to have some blood pressure medicines reinstituted depending on his readings.

## 2016-01-20 NOTE — Assessment & Plan Note (Signed)
History of hyperlipidemia with statin intolerance in the past. He is very slowly restarted on Crestor. Lipid profile performed 01/07/16 revealed total cholesterol 216, LDL 158 and HDL of 31

## 2016-01-28 ENCOUNTER — Ambulatory Visit: Payer: Medicare Other | Admitting: Cardiovascular Disease

## 2016-02-08 ENCOUNTER — Telehealth: Payer: Self-pay | Admitting: Pharmacist Clinician (PhC)/ Clinical Pharmacy Specialist

## 2016-02-08 NOTE — Telephone Encounter (Signed)
Spoke with daughter - patient has noticed BP trending back up in past couple of weeks.   Was previously on Micardis hct 80-25 and amlodipine 5 mg, both d/c at hospital.  Home readings now often up to 170's systolic, with highest being 214.  Causing the patient to become quite anxious per daughter.  Will have him restart the amlodipine 5 mg qhs tonight.  He had labs drawn by PCP recently, so will need to get those [facility].  Once we check the potassium, can potentially add back the Micardis HCTZ.  Patient has BP visit on 6-1 with us, but can call sooner if still having problems.

## 2016-02-09 ENCOUNTER — Other Ambulatory Visit: Payer: Self-pay | Admitting: Neurology

## 2016-02-09 NOTE — Telephone Encounter (Signed)
[name] daughter called said he needs a refill for divalproex (DEPAKOTE ER) 500 MG 24 hr tablet called to [facility]. He has 1 tablet left for this morning. Pt was seen at hospital, has appt with Dr Pearlean BrownieSethi 03/20/16.

## 2016-02-09 NOTE — Telephone Encounter (Signed)
Ok to refill 

## 2016-02-10 NOTE — Telephone Encounter (Signed)
Message sent to RN

## 2016-02-10 NOTE — Telephone Encounter (Signed)
Spoke with daughter, [name] and informed her Dr Pearlean BrownieSethi sent in refill. Reminded her of patient's FU in July. She stated she had no questions, verbalized understanding, appreciation of call.

## 2016-02-17 ENCOUNTER — Ambulatory Visit (INDEPENDENT_AMBULATORY_CARE_PROVIDER_SITE_OTHER): Payer: Medicare Other | Admitting: Pharmacist Clinician (PhC)/ Clinical Pharmacy Specialist

## 2016-02-17 ENCOUNTER — Encounter: Payer: Self-pay | Admitting: Pharmacist Clinician (PhC)/ Clinical Pharmacy Specialist

## 2016-02-17 VITALS — BP 160/84 | HR 72 | Ht 71.0 in | Wt 215.6 lb

## 2016-02-17 DIAGNOSIS — I10 Essential (primary) hypertension: Secondary | ICD-10-CM

## 2016-02-17 MED ORDER — LOSARTAN POTASSIUM 50 MG PO TABS: 50.0000 mg | ORAL_TABLET | Freq: Every day | ORAL | Status: DC

## 2016-02-17 NOTE — Progress Notes (Signed)
     02/17/2016 Adrian DrownWilliam R Falco 01/08/1935 [id number]   HPI:  Adrian DrownWilliam R Melendez is a 81 y.o. male patient of Dr Allyson SabalBerry, with a PMH below who presents today for hypertension clinic evaluation.  He is here today with his daughter and granddaughter.  He had previously been on micardis hctz 80/25, amlodipine 5 mg qd.  He was recently hospitalized for a stroke and both medications were stopped because of hypotension.  Because of the recent stroke, he was told by neurology that he should keep his BP between 136-160 systolic to be sure he has good cerebral perfusion.  When he left the hospital he was told to stay off micardis hctz and amlodipine, and given a new prescription for losartan 50 mg.  He has not yet filled this prescription.  We restarted the amlodipine 5 mg about 1 week ago.    Cardiac Hx: hypertension, hyperlipidemia, PAF (on rivaroxaban); muliple strokes  Family Hx: daughter with mild hypertension,   Social Hx:  No tobacco, alcohol or caffeine  Diet: has been changed since being discharged from the hospital.  Granddaughter has helped to be sure he is eating fresh/homemade foods, with no added sodium  Exercise: none specifically, but is trying to stay active  Home BP readings: in the last week since starting amlodipine again average pressure 161/72 with range of 128-194/65-100  Current antihypertensive medications: amlodipine 5 mg qd  Intolerances:  Wt Readings from Last 3 Encounters:  02/17/16 215 lb 9.6 oz (97.796 kg)  01/20/16 216 lb (97.977 kg)  01/12/16 219 lb 5.7 oz (99.5 kg)   BP Readings from Last 3 Encounters:  02/17/16 160/84  01/20/16 165/87  01/15/16 139/57   Pulse Readings from Last 3 Encounters:  02/17/16 72  01/20/16 65  01/15/16 53    Current Outpatient Prescriptions  Medication Sig Dispense Refill  . allopurinol (ZYLOPRIM) 300 MG tablet Take 450 mg by mouth daily.    [name]. aspirin 81 MG chewable tablet Chew 1 tablet (81 mg total) by mouth daily.    .  divalproex (DEPAKOTE ER) 500 MG 24 hr tablet TAKE 1 TABLET BY MOUTH EVERY DAY 30 tablet 1  . rivaroxaban (XARELTO) 20 MG TABS tablet TAKE 1 TABLET BY MOUTH DAILY WITH SUPPER. 30 tablet 5  . rosuvastatin (CRESTOR) 20 MG tablet Take 1 tablet (20 mg total) by mouth daily. 30 tablet 0   No current facility-administered medications for this visit.    Allergies  Allergen Reactions  . Statins     Cause leg weakness     Past Medical History  Diagnosis Date  . Hypertension   . Gout   . Dizziness 02/29/2012  . Syncope, near 02/29/2012  . Hx-TIA (transient ischemic attack) June 2008  . Hx of migraines   . Gout   . Hyperlipidemia     statin intolerance  . History of nuclear stress test 03/28/2012    lexiscan myoview; negative for ischemia  . PAF (paroxysmal atrial fibrillation) (HCC)     xarelto  . Headache   . Migraines     classic ophthalmic  . Colon polyps     colonoscopy 2006  . Hyperlipidemia   . Hyperglycemia   . Migraine aura without headache   . CKD (chronic kidney disease), stage III     Blood pressure 160/84, pulse 72, height 5\' 11"  (1.803 m), weight 215 lb 9.6 oz (97.796 kg).    Phillips HayKristin Jaiel Saraceno PharmD CPP [facility]

## 2016-02-17 NOTE — Patient Instructions (Signed)
Return for a a follow up appointment in 3-4 weeks  Your blood pressure today is 160/84   Check your blood pressure at home most days and keep record of the readings.  Take your BP meds as follows: start losartan 50 mg once daily in the mornings; continue amlodipine 5 mg in the evenings  Bring all of your meds, your BP cuff and your record of home blood pressures to your next appointment.  Exercise as you're able, try to walk approximately 30 minutes per day.  Keep salt intake to a minimum, especially watch canned and prepared boxed foods.  Eat more fresh fruits and vegetables and fewer canned items.  Avoid eating in fast food restaurants.    HOW TO TAKE YOUR BLOOD PRESSURE: . Rest 5 minutes before taking your blood pressure. .  Don't smoke or drink caffeinated beverages for at least 30 minutes before. . Take your blood pressure before (not after) you eat. . Sit comfortably with your back supported and both feet on the floor (don't cross your legs). . Elevate your arm to heart level on a table or a desk. . Use the proper sized cuff. It should fit smoothly and snugly around your bare upper arm. There should be enough room to slip a fingertip under the cuff. The bottom edge of the cuff should be 1 inch above the crease of the elbow. . Ideally, take 3 measurements at one sitting and record the average.

## 2016-02-17 NOTE — Assessment & Plan Note (Signed)
Today his blood pressure is at the top of his goal range.  Will set his high end BP goal at 160 systolic.  For now he is to continue with the amlodipine 5 mg and will have him start with the losartan 50 mg that was prescribed.  This should bring enough drop to keep him at or above 130 mg.  I will see him back in 3 weeks for follow up.

## 2016-03-10 ENCOUNTER — Telehealth: Payer: Self-pay | Admitting: *Deleted

## 2016-03-10 MED ORDER — AMLODIPINE BESYLATE 5 MG PO TABS: 5.0000 mg | ORAL_TABLET | Freq: Every day | ORAL | Status: AC

## 2016-03-10 NOTE — Telephone Encounter (Signed)
Refill sent to the pharmacy electronically.  Note forwarded to PCP

## 2016-03-10 NOTE — Telephone Encounter (Signed)
Patients daughter called and stated that the patient would like a ninety day refill on amlodipine to [facility]. She also stated that patients pcp, Dr Jarold MottoPatterson would like a copy of the office notes from the patients recent visit with Belenda CruiseKristin. Thanks, MI

## 2016-03-14 ENCOUNTER — Ambulatory Visit: Payer: Medicare Other

## 2016-03-20 ENCOUNTER — Ambulatory Visit: Payer: Medicare Other | Admitting: Neurology

## 2016-03-23 ENCOUNTER — Ambulatory Visit: Payer: Medicare Other

## 2016-03-29 ENCOUNTER — Ambulatory Visit (INDEPENDENT_AMBULATORY_CARE_PROVIDER_SITE_OTHER): Payer: Medicare Other | Admitting: Neurology

## 2016-03-29 ENCOUNTER — Encounter: Payer: Self-pay | Admitting: Neurology

## 2016-03-29 DIAGNOSIS — I6322 Cerebral infarction due to unspecified occlusion or stenosis of basilar arteries: Secondary | ICD-10-CM | POA: Diagnosis not present

## 2016-03-29 DIAGNOSIS — I6340 Cerebral infarction due to embolism of unspecified cerebral artery: Secondary | ICD-10-CM

## 2016-03-29 DIAGNOSIS — I10 Essential (primary) hypertension: Secondary | ICD-10-CM | POA: Diagnosis not present

## 2016-03-29 DIAGNOSIS — I951 Orthostatic hypotension: Secondary | ICD-10-CM | POA: Diagnosis not present

## 2016-03-29 DIAGNOSIS — I679 Cerebrovascular disease, unspecified: Secondary | ICD-10-CM

## 2016-03-29 DIAGNOSIS — Z7901 Long term (current) use of anticoagulants: Secondary | ICD-10-CM

## 2016-03-29 DIAGNOSIS — G43809 Other migraine, not intractable, without status migrainosus: Secondary | ICD-10-CM

## 2016-03-29 DIAGNOSIS — E785 Hyperlipidemia, unspecified: Secondary | ICD-10-CM

## 2016-03-29 DIAGNOSIS — I480 Paroxysmal atrial fibrillation: Secondary | ICD-10-CM

## 2016-03-29 DIAGNOSIS — G43109 Migraine with aura, not intractable, without status migrainosus: Secondary | ICD-10-CM

## 2016-03-29 NOTE — Progress Notes (Signed)
STROKE NEUROLOGY FOLLOW UP NOTE  NAME: Adrian Melendez DOB: June 03, 1935  REASON FOR VISIT: stroke follow up HISTORY FROM: pt and daughter and granddaughter  Today we had the pleasure of seeing Adrian Melendez in follow-up at our Neurology Clinic. Pt was accompanied by daughter and granddaughter.   History Summary [name] is a 81 y.o. male with history of atrial fibrillation on xarelto, ocular migraines was admitted on 01/06/16 for visual auras. MRI showed multiple punctate acute infarcts at right occipital lobe as well as old right cerebellar, left pontine and b/l BG lacunes. 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 CUS, TTE unremarkable. LDL 158 and A1C 6.3. Stroke considered d/t afib vs migrainous infarct vs symptomatic BA stenosis. He was continued on Xarelto, added ASA 81mg  and started on crestor 20 as well as depakote for migraine prevention.   He was readmitted on 01/12/16 due to transient right UE and LE numbness. MRI showed new punctate right PCA and left MCA/PCA territory infarcts. Found to have orthostatic hypotension. Stroke considered as symptomatic BA, VA, PCA stenosis in the setting of orthostasis. His other home BP meds discontinued, only on norvasc 5mg , recommended BP goal 130-160 and compression socks. Continued on ASA and Xarelto. His visual aura much improved with depakote and BP stabilized. He was discharged in good condition.   Interval History During the interval time, the patient has been doing well. Only had one episode of right lip tingling lasting 2 sec. Had 2 episodes of visual aura with wavy lines lasting 3-4 secs, but no HA. He still has intermittent lightheadedness, could be positional such as standing up or sitting up, or evening sometimes sitting in chair. Usually brief. BP normally 140-150, on compression socks. Today BP 134/60. On Xarelto and ASA 81 and crestor 20 without side effects.     REVIEW  OF SYSTEMS: Full 14 system review of systems performed and notable only for those listed below and in HPI above, all others are negative:  Constitutional:  Activity change, fatigue Cardiovascular: leg swelling Ear/Nose/Throat:  Ringing in ears Skin:  Eyes:   Respiratory:   Gastroitestinal:   Genitourinary: frequent urination Hematology/Lymphatic:   Endocrine:  Musculoskeletal:  Walking difficulty Allergy/Immunology:   Neurological:   dizziness Psychiatric:  Sleep: frequent waking, daytime sleepiness  The following represents the patient's updated allergies and side effects list: Allergies  Allergen Reactions  . Statins     Cause leg weakness     The neurologically relevant items on the patient's problem list were reviewed on today's visit.  Neurologic Examination  A problem focused neurological exam (12 or more points of the single system neurologic examination, vital signs counts as 1 point, cranial nerves count for 8 points) was performed.  There were no vitals taken for this visit.  General - Well nourished, well developed, in no apparent distress.  Ophthalmologic - Fundi not visualized due to eye movement.  Cardiovascular - Regular rate and rhythm.  Mental Status -  Level of arousal and orientation to time, place, and person were intact. Language including expression, naming, repetition, comprehension was assessed and found intact. Fund of Knowledge was assessed and was intact.  Cranial Nerves II - XII - II - Visual field intact OU. III, IV, VI - Extraocular movements intact. V - Facial sensation intact bilaterally. VII - Facial movement intact bilaterally. VIII - Hearing & vestibular intact bilaterally. X - Palate elevates symmetrically. XI - Chin turning &  shoulder shrug intact bilaterally. XII - Tongue protrusion intact.  Motor Strength - The patient's strength was normal in all extremities and pronator drift was absent.  Bulk was normal and fasciculations  were absent.   Motor Tone - Muscle tone was assessed at the neck and appendages and was normal.  Reflexes - The patient's reflexes were 1+ in all extremities and he had no pathological reflexes.  Sensory - Light touch, temperature/pinprick were assessed and were normal.    Coordination - The patient had normal movements in the hands and feet with no ataxia or dysmetria.  Tremor was absent.  Gait and Station - slow and small stride, but stable.   Functional score  mRS = 2   0 - No symptoms.   1 - No significant disability. Able to carry out all usual activities, despite some symptoms.   2 - Slight disability. Able to look after own affairs without assistance, but unable to carry out all previous activities.   3 - Moderate disability. Requires some help, but able to walk unassisted.   4 - Moderately severe disability. Unable to attend to own bodily needs without assistance, and unable to walk unassisted.   5 - Severe disability. Requires constant nursing care and attention, bedridden, incontinent.   6 - Dead.   NIH Stroke Scale = 0   Data reviewed: I personally reviewed the images and agree with the radiology interpretations.  Mr Adrian Melendez Wo Contrast 01/06/2016 1. The Right PCA is within normal limits. 2. Severe proximal Basilar artery stenosis, and severe Left PCA origin stenosis have developed since 2008. 3. 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 Right PICA remains patent. The left vertebral artery is chronically dominant. 4. The anterior circulation is more stable since 2008, with only mild bilateral MCA origin stenoses.   Mr Adrian Melendez Wo Contrast 01/06/2016 At least 3 subcentimeter foci of probable acute ischemia RIGHT occipital lobe. Focal loss of RIGHT vertebral artery V4 flow void concerning for focal occlusion. Moderate to severe chronic small vessel ischemic disease. Old small RIGHT cerebellar and LEFT pontine infarcts.  Old bilateral basal ganglia lacunar infarcts.   Carotid Doppler  There is 1-39% bilateral ICA stenosis. Vertebral artery flow is antegrade.  TTE - Normal LV size with mild LV hypertrophy. EF 60-65%. Normal RV  size and systolic function. No significant valvular  abnormalities.  Ct Melendez Code Stroke W/o Cm 01/12/2016 1. No acute finding. 2. Small cortical infarcts seen on Adrian Melendez MRI 6 days prior are not visible. No hemorrhagic conversion or evidence of infarct extension. 3. Chronic microvascular disease.   Mr Adrian Melendez Wo Contrast  01/12/2016 IMPRESSION: New punctate foci of acute infarction in the left temporal lobe and right posterior parietal lobe. These are again consistent with micro embolic infarctions. No swelling, mass effect or acute hemorrhage. Extensive old small vessel insults elsewhere throughout the Adrian Melendez, some associated with hemosiderin deposition.   Ct Angio Melendez and Neck W/cm &/or Wo/cm  01/12/2016 IMPRESSION: CT Melendez: Known acute LEFT temporoparietal lobe infarcts not apparent by CT. Chronic changes including old LEFT basal ganglia lacunar infarct and moderate chronic small vessel ischemic disease. 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 Atherosclerosis without hemodynamically significant stenosis of the carotid arteries. CTA Melendez: Stable angiographic appearance of the Adrian Melendez: Chronically occluded distal RIGHT V4 segment. High-grade stenosis proximal basilar artery. Tandem high-grade stenoses LEFT posterior cerebral artery. Moderate stenosis RIGHT P2 segment.  Moderate stenosis RIGHT M1 segment.   Component     Latest Ref Rng 01/05/2016 01/07/2016 01/13/2016  Cholesterol     0 - 200 mg/dL 161 (H) 096 (H)   Triglycerides     <150 mg/dL 045 (H) 409   HDL Cholesterol     >40 mg/dL 34 (L) 31 (L)   Total CHOL/HDL Ratio      6.6 7.0   VLDL     0 - 40 mg/dL 31 27   LDL (calc)     0 - 99 mg/dL 811 (H) 914 (H)     Hemoglobin A1C     4.8 - 5.6 %  6.3 (H)   Mean Plasma Glucose       134   Vitamin B12     180 - 914 pg/mL   296  Folate     >5.9 ng/mL   20.0  TSH     0.350 - 4.500 uIU/mL   3.397    Assessment: As you may recall, he is a 81 y.o. Caucasian male with PMH of atrial fibrillation on xarelto, ocular migraines was admitted on 01/06/16 for visual auras. MRI showed multiple punctate acute infarcts at right occipital lobe as well as old right cerebellar, left pontine and b/l BG lacunes. 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 CUS, TTE unremarkable. LDL 158 and A1C 6.3. Stroke considered d/t afib vs migrainous infarct vs symptomatic BA stenosis. He was continued on Xarelto, added ASA  and started on crestor 20 as well as depakote for migraine prevention. He was readmitted on 01/12/16 due to transient right UE and LE numbness. MRI showed new punctate right PCA and left MCA/PCA territory infarcts. Found to have orthostatic hypotension. Stroke considered as symptomatic BA, VA, PCA stenosis in the setting of orthostasis. Recommended BP goal 130-160 and compression socks. His visual aura much improved with depakote. Still has intermittent mild lightheadedness. On Xarelto and ASA 81 and crestor 20 without side effects.  Plan:  - continue Xarelto and ASA and crestor for stroke and cardiac prevention - check BP at home and BP goal 130-160 - Follow up with your primary care physician for stroke risk factor modification. Recommend maintain blood pressure goal 130-160/80, diabetes with hemoglobin A1c goal below 7.0% and lipids with LDL cholesterol goal below 70 mg/dL.  - consider repeat lipid panel next visit to Dr. Jarold Motto.  - continue depakote for migraine prevention - follow up with Dr. Allyson Sabal as scheduled.  - may consider repeat B12 level and B12 supplement if needed next visit. - follow up in 4 months.   I spent more than 25 minutes of face to face time  with the patient. Greater than 50% of time was spent in counseling and coordination of care.    No orders of the defined types were placed in this encounter.    Meds ordered this encounter  Medications  . Ubiquinol 100 MG CAPS    Sig: Take 100 mg by mouth.    Patient Instructions  - continue Xarelto and ASA and crestor for stroke and cardiac prevention - check BP at home and BP goal 130-160 - Follow up with your primary care physician for stroke risk factor modification. Recommend maintain blood pressure goal <130/80, diabetes with hemoglobin A1c goal below 7.0% and lipids with LDL cholesterol goal below 70 mg/dL.  - consider repeat lipid panel next visit to Dr. Jarold Motto.  - continue depakote for migraine prevention - follow  up with Dr. Allyson SabalBerry as scheduled.  - sitting up or standing up slow. If feels lightheadedness, sit down or lying down and check BP. Keep hydrated during the summer time.  - follow up in 4 months.     Marvel PlanJindong Kadance Mccuistion, MD PhD [facility] [address], [address] [place], [place] [address] [phone]

## 2016-03-29 NOTE — Patient Instructions (Signed)
-   continue Xarelto and ASA and crestor for stroke and cardiac prevention - check BP at home and BP goal 130-160 - Follow up with your primary care physician for stroke risk factor modification. Recommend maintain blood pressure goal <130/80, diabetes with hemoglobin A1c goal below 7.0% and lipids with LDL cholesterol goal below 70 mg/dL.  - consider repeat lipid panel next visit to Dr. Jarold MottoPatterson.  - continue depakote for migraine prevention - follow up with Dr. Allyson SabalBerry as scheduled.  - sitting up or standing up slow. If feels lightheadedness, sit down or lying down and check BP. Keep hydrated during the summer time.  - follow up in 4 months.

## 2016-03-30 ENCOUNTER — Telehealth: Payer: Self-pay | Admitting: Cardiovascular Disease

## 2016-03-30 NOTE — Telephone Encounter (Signed)
Patient was scheduled for follow up appointment in blood pressure clinic and had to cancel  She does not know if this needs to be rescheduled or if ok to just wait and see [name] PA on 7/28 Per daughter blood pressures for patient have been running between the 130-160 range. Did see neurology yesterday and no changes made [facility] Advised daughter would have Pharm D review readings and would call back

## 2016-03-30 NOTE — Telephone Encounter (Signed)
New message  Pt daughter call requesting to speak with RN about patient rescheduling can appt for 7/6 CVRR. Pt daughter wanted to know ho pt still need to see CVRR if pt has appt on 7/28.  Pt Daughter also ask if Dr. Allyson SabalBerry received fax from Dr. Jarold MottoPatterson office concerning pt 24 hour bp monitoring? please call back to discuss

## 2016-03-31 ENCOUNTER — Telehealth: Payer: Self-pay | Admitting: Cardiology

## 2016-03-31 NOTE — Telephone Encounter (Signed)
Discussed with Roney MarionKristin Pharm D, she reviewed blood pressure monitor results. No med changes and just keep [facility] Left message on daughters voicemail

## 2016-03-31 NOTE — Telephone Encounter (Signed)
[facility] on 04/14/16 with Corine ShelterLuke Kilroy, PA.  Records given to [employer] (medical records) for Luke's schedule on 04/14/16. lp

## 2016-04-14 ENCOUNTER — Ambulatory Visit (INDEPENDENT_AMBULATORY_CARE_PROVIDER_SITE_OTHER): Payer: Medicare Other | Admitting: Cardiology

## 2016-04-14 ENCOUNTER — Encounter: Payer: Self-pay | Admitting: Cardiology

## 2016-04-14 ENCOUNTER — Encounter: Payer: Self-pay | Admitting: Neurology

## 2016-04-14 VITALS — BP 164/78 | HR 70 | Ht 71.0 in | Wt 212.0 lb

## 2016-04-14 DIAGNOSIS — R55 Syncope and collapse: Secondary | ICD-10-CM | POA: Diagnosis not present

## 2016-04-14 DIAGNOSIS — Z7901 Long term (current) use of anticoagulants: Secondary | ICD-10-CM | POA: Diagnosis not present

## 2016-04-14 DIAGNOSIS — N183 Chronic kidney disease, stage 3 unspecified: Secondary | ICD-10-CM

## 2016-04-14 DIAGNOSIS — I10 Essential (primary) hypertension: Secondary | ICD-10-CM | POA: Diagnosis not present

## 2016-04-14 DIAGNOSIS — I6340 Cerebral infarction due to embolism of unspecified cerebral artery: Secondary | ICD-10-CM

## 2016-04-14 DIAGNOSIS — E785 Hyperlipidemia, unspecified: Secondary | ICD-10-CM

## 2016-04-14 DIAGNOSIS — I480 Paroxysmal atrial fibrillation: Secondary | ICD-10-CM

## 2016-04-14 NOTE — Assessment & Plan Note (Signed)
SCr 1.49 April 2017 (not on ACE/ARB)

## 2016-04-14 NOTE — Patient Instructions (Addendum)
Your physician has recommended that you wear an  30 day event monitor. Event monitors are medical devices that record the heart's electrical activity. Doctors most often [place] these monitors to diagnose arrhythmias. Arrhythmias are problems with the speed or rhythm of the heartbeat. The monitor is a small, portable device. You can wear one while you do your normal daily activities. This is usually used to diagnose what is causing palpitations/syncope (passing out). This will be placed on at the [employer].   Your physician recommends that you schedule a follow-up appointment with Dr Allyson Sabal after the monitor.

## 2016-04-14 NOTE — Assessment & Plan Note (Signed)
PAF with a SSS component. He has had an episode of near syncope- ? Secondary to pause or bradycardia.

## 2016-04-14 NOTE — Assessment & Plan Note (Addendum)
On Crestor 20 mg-tolerating this with the addition of CoQ10.

## 2016-04-14 NOTE — Assessment & Plan Note (Signed)
Symptomatic with VBA, PCA stenosis. Keep B/P 130-160 systolic.

## 2016-04-14 NOTE — Assessment & Plan Note (Signed)
Controlled.  

## 2016-04-14 NOTE — Assessment & Plan Note (Signed)
Xarelto

## 2016-04-14 NOTE — Assessment & Plan Note (Addendum)
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

## 2016-04-14 NOTE — Progress Notes (Signed)
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   Prior Myoview July 2013, was nonischemic. However, event monitor did show PAF with fairly long pauses. Dr Allyson Sabal elected to put him on Xarelto and discontinued his beta blocker. He was hospitalized 01/06/16 for one day and was thought to have a migrainous stroke. He was readmitted several days later with recurrent neurologic symptoms. An MRI did show multiple sub-centimeter area occipital infarcts (on Xarelto) and and an MRA showed proximal basilar artery stenosis as well as left posterior cerebral artery stenosis. He was seen by the neurology service who briefly discussed possible intervention but later thought that conservative therapy was more appropriate. ASA was added to Xarelto. His blood pressure medicines were adjusted because of relative hypotension. He tells me today that he has had two episodes since April of his Rt leg "jerking like crazy". This lasts only a minute or two. He had an episode this am while standing at the sink that was also associated with his Rt arm shaking. He denies any syncope but admits he has had one or two near syncope spells. He denies palpations or unusual dyspnea.     Current Outpatient Prescriptions  Medication Sig Dispense Refill  . allopurinol (ZYLOPRIM) 300 MG tablet Take 450 mg by mouth daily.    [name] amLODipine (NORVASC) 5 MG tablet Take 1 tablet (5 mg total) by mouth daily. 90 tablet 3  . aspirin 81 MG chewable tablet Chew 1 tablet (81 mg total) by mouth daily.    . divalproex (DEPAKOTE ER) 500 MG 24 hr tablet TAKE 1 TABLET BY MOUTH EVERY DAY 30 tablet 1  . rivaroxaban (XARELTO) 20 MG TABS tablet TAKE 1 TABLET BY MOUTH DAILY WITH SUPPER. 30 tablet 5  . rosuvastatin (CRESTOR) 20  MG tablet Take 1 tablet (20 mg total) by mouth daily. 30 tablet 0  . sertraline (ZOLOFT) 25 MG tablet Take 25 mg by mouth daily.    [name] Ubiquinol 100 MG CAPS Take 100 mg by mouth.     No current facility-administered medications for this visit.     Allergies  Allergen Reactions  . Statins     Cause leg weakness     Social History   Social History  . Marital status: Married    Spouse name: N/A  . Number of children: 1  . Years of education: [employer]   Occupational History  . retired Risk analyst    Social History Main Topics  . Smoking status: Never Smoker  . Smokeless tobacco: Never Used  . Alcohol use No  . Drug use: No  . Sexual activity: Yes   Other Topics Concern  . Not on file   Social History Narrative   Consumes no caffeine     Review of Systems: General: negative for chills, fever, night sweats or weight changes.  Cardiovascular: negative for chest pain, dyspnea on exertion, edema, orthopnea, palpitations, paroxysmal nocturnal dyspnea or shortness of breath Dermatological: negative for rash Respiratory: negative for cough or wheezing Urologic: negative for hematuria Abdominal: negative for nausea, vomiting, diarrhea, bright red blood per rectum, melena, or hematemesis Neurologic: negative for visual changes, syncope, or dizziness All other systems reviewed and are otherwise negative except as noted above.    Blood pressure (!) 164/78, pulse 70, height 5'  11" (1.803 m), weight 212 lb (96.2 kg), SpO2 94 %.  General appearance: alert, cooperative, no distress and moderately obese Neck: no carotid bruit and no JVD Lungs: clear to auscultation bilaterally Heart: irregularly irregular rhythm Extremities: no edema, support hose in place Skin: Skin color, texture, turgor normal. 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  CKD (chronic kidney disease), stage III SCr 1.49 April 2017 (not on ACE/ARB)  Orthostatic hypotension Symptomatic with VBA, PCA stenosis. Keep B/P 130-160 systolic.  PAF (paroxysmal atrial fibrillation) by Holter June 2012 PAF with a SSS component. He has had an episode of near syncope- ? Secondary to pause or bradycardia.    PLAN  I suggested we proceed with an event monitor, I'm concerned about these episodes of near syncope. I also asked then to contact Dr Roda Shutters about these episodes of arm and leg jerking- ? seizure activity. F/U with Dr Allyson Sabal after his event monitor. He will have lipids done in Oct by his PCP. I did not adjust his antihypertensives.   Corine Shelter PA-C 04/14/2016 2:48 PM

## 2016-04-18 ENCOUNTER — Telehealth: Payer: Self-pay | Admitting: Neurology

## 2016-04-18 ENCOUNTER — Telehealth: Payer: Self-pay | Admitting: Cardiovascular Disease

## 2016-04-18 ENCOUNTER — Other Ambulatory Visit: Payer: Self-pay | Admitting: Cardiology

## 2016-04-18 ENCOUNTER — Ambulatory Visit (INDEPENDENT_AMBULATORY_CARE_PROVIDER_SITE_OTHER): Payer: Medicare Other

## 2016-04-18 DIAGNOSIS — I480 Paroxysmal atrial fibrillation: Secondary | ICD-10-CM

## 2016-04-18 DIAGNOSIS — I639 Cerebral infarction, unspecified: Secondary | ICD-10-CM

## 2016-04-18 DIAGNOSIS — R55 Syncope and collapse: Secondary | ICD-10-CM

## 2016-04-18 NOTE — Telephone Encounter (Signed)
Rn call patients daughter back about her fathers legs shaking. Pt was just seen by Dr. Roda Shutters last month for a stroke follow up. PT has had no falls or injuries for the shaking of the legs. PTs daughter Selena Batten stated her father had the symptoms in the hospital but it resolve. Pt does not ambulate with a cane or walker. Pt is currently not in therapy. He does see a cardiologist. The shaking of the leg last from 2 to 3 minutes. Rn stated that a message will be sent to Dr Roda Shutters if patient needs to bee seen for this issue.

## 2016-04-18 NOTE — Telephone Encounter (Signed)
[name] daughter Selena Batten is calling and states that since her father's strokes in April 2017 he has had uncontrollable shaking in his leg.  His daughter is asking if she should set up an appointment for him to come back in.  Please call.

## 2016-04-18 NOTE — Telephone Encounter (Signed)
Discussed with pt and his daughter over the phone. Pt described leg shaking on immediately standing or standing for a while, not happening in sitting or lying or walking. One time, he felt lightheadedness also.   I asked daughter and pt to check BP at that time in the future to see if it BP related. If not, we have to think about a condition called "orthostatic tremor". I asked pt and daughter to research on that and to see if his symptoms fit to it. If so, we can use clonazepam to treat for that.   Daughter and pt expressed understanding and appreciation.   Marvel Plan, MD PhD Stroke Neurology 04/18/2016 4:50 PM

## 2016-04-18 NOTE — Telephone Encounter (Signed)
Received call from [employer]-reports abnormal EKG with 1st documented episode of Afib at a rate of 40-60 bpm.  Hx: PAF on xarelto.   EKG being faxed to [phone].    Received EKG-MD Berry notified.  No new orders at this time.  F/u appt already scheduled for 9/8 following event monitor with MD Allyson Sabal.

## 2016-04-19 ENCOUNTER — Telehealth: Payer: Self-pay | Admitting: Cardiovascular Disease

## 2016-04-19 ENCOUNTER — Telehealth: Payer: Self-pay | Admitting: Internal Medicine

## 2016-04-19 NOTE — Telephone Encounter (Signed)
New message ° ° ° ° ° °Calling with an abnormal monitor reading °

## 2016-04-19 NOTE — Telephone Encounter (Signed)
Strips shown to dr berry, no change at this time continue to follow. Follow up as scheduled. Pt made aware.

## 2016-04-19 NOTE — Telephone Encounter (Signed)
Dr Allyson Sabal aware, patient has appointment tomorrow with Di Kindle NP

## 2016-04-19 NOTE — Telephone Encounter (Signed)
On Call Cardiology  Received call from [facility]. Patient had short pausesx2 frmo 3 to 3.2 seconds around 1 am. Otherwise staying in AF with controlled V rate. Patient is on Xarelto for stroke prevention. Will route this information to Dr. Allyson Sabal.   Nevin Bloodgood, MD

## 2016-04-19 NOTE — Telephone Encounter (Signed)
New message   FYI     Adrian Melendez of [facility] is calling to report an abnormal EKG. Please call.

## 2016-04-19 NOTE — Telephone Encounter (Signed)
Spoke with [facility], the pt had an episode of slow atrial fib with heat rate <40 bpm. Spoke with pt, he reports he just recently got up. His bp is 170 and pulse by his machine is 40. Pt reports he feels fine.

## 2016-04-20 ENCOUNTER — Ambulatory Visit (INDEPENDENT_AMBULATORY_CARE_PROVIDER_SITE_OTHER): Payer: Medicare Other | Admitting: Nurse Practitioner

## 2016-04-20 ENCOUNTER — Encounter: Payer: Self-pay | Admitting: Nurse Practitioner

## 2016-04-20 VITALS — BP 160/82 | HR 64 | Ht 71.0 in | Wt 205.4 lb

## 2016-04-20 DIAGNOSIS — R55 Syncope and collapse: Secondary | ICD-10-CM

## 2016-04-20 DIAGNOSIS — I483 Typical atrial flutter: Secondary | ICD-10-CM

## 2016-04-20 DIAGNOSIS — I10 Essential (primary) hypertension: Secondary | ICD-10-CM

## 2016-04-20 DIAGNOSIS — R001 Bradycardia, unspecified: Secondary | ICD-10-CM | POA: Diagnosis not present

## 2016-04-20 NOTE — Progress Notes (Signed)
Office Visit    Patient Name: Adrian Melendez Date of Encounter: 04/20/2016  Primary Care Provider:  Garlan Fillers, MD Primary Cardiologist:  Erlene Quan, MD   Chief Complaint    81 year old male with a history of atrial flutter, bradycardia, hypertension, hyperlipidemia, obesity, and CVA, who presents for follow-up related to bradycardia noted on event monitoring.  Past Medical History    Past Medical History:  Diagnosis Date  . Atrial flutter (HCC)    a. On xarelto  . CKD (chronic kidney disease), stage III   . Colon polyps    colonoscopy 2006  . Dizziness 02/29/2012  . Gout   . Gout   . Headache   . History of nuclear stress test 03/28/2012   lexiscan myoview; negative for ischemia  . Hx of migraines   . Hx-TIA (transient ischemic attack) June 2008  . Hyperglycemia   . Hyperlipidemia    statin intolerance  . Hyperlipidemia   . Hypertension   . Migraine aura without headache   . Migraines    classic ophthalmic  . Syncope, near 02/29/2012   a. Resulting in discontinuation of beta blocker.   Past Surgical History:  Procedure Laterality Date  . COLONOSCOPY     2006,11/12  . EYE SURGERY     laser surgery to seal vein   . TRANSTHORACIC ECHOCARDIOGRAM  03/01/2012   EF 55-60% with normal systolic function; mild AV regurg; calcified mitral annulus - ordered for syncope    Allergies  Allergies  Allergen Reactions  . Statins     Cause leg weakness     History of Present Illness    81 year old male with a complex past medical history. He has a history of paroxysmal atrial fibrillation dating back to July 2013 at which time, it was found on event monitoring. He is also noted to have fairly long pauses. At one point, he was on beta blocker, however this was discontinued after noting pauses and also presyncope. He has been anticoagulated with xarelto. More recently, he has been admitted twice to the hospital with symptoms of TIA versus stroke. His most recent  admission in April, MRI did show multiple subcentimeter occipital infarcts and MRA showed proximal basilar artery stenosis as well as left posterior cerebral artery stenosis. He has been managed with ongoing Xarelto therapy and aspirin was also advised by neurology. He was recently seen in clinic with complaints of intermittent lightheadedness and in the setting of atrial flutter and prior history of bradycardia with pauses, event monitoring was ordered. This was placed on August 1. The patient has been asymptomatic since the monitor was placed, however we have received multiple transmissions from [employer] related to both periods of rate-controlled atrial flutter and also predominantly nocturnal bradycardia with rates in the 30s. On a few strips, he has pauses that are just over 3 seconds otherwise the vast majority of pauses are under 3 seconds. There are several strips from August 2 from the mid afternoon hours at approximately 3 PM, where he is bradycardic with rates in the 30s and 40s. Upon discussing this today, patient believes he was napping during that period of time. Notably, he has a history of sleep apnea and has been prescribed CPAP but does not tolerate it and therefore does not use it. Because of findings on his monitoring, he was out to my schedule today. Again, he has been asymptomatic since the monitor was placed. He has not taken a nap today and has not received  any phone calls from the monitoring company either. He denies any chest pain, dyspnea, PND, orthopnea, dizziness, syncope, or edema.  Home Medications    Prior to Admission medications   Medication Sig Start Date End Date Taking? Authorizing Provider  allopurinol (ZYLOPRIM) 300 MG tablet Take 450 mg by mouth daily.   Yes Historical Provider, MD  ALPRAZolam Prudy Feeler) 0.5 MG tablet Take 0.25 mg by mouth at bedtime. Take one half tab at bedtime 01/31/16  Yes Historical Provider, MD  amLODipine (NORVASC) 5 MG tablet Take 1 tablet (5 mg  total) by mouth daily. 03/10/16  Yes Runell Gess, MD  aspirin 81 MG chewable tablet Chew 1 tablet (81 mg total) by mouth daily. 01/07/16  Yes Shanker Levora Dredge, MD  divalproex (DEPAKOTE ER) 500 MG 24 hr tablet TAKE 1 TABLET BY MOUTH EVERY DAY 02/10/16  Yes Micki Riley, MD  rivaroxaban (XARELTO) 20 MG TABS tablet TAKE 1 TABLET BY MOUTH DAILY WITH SUPPER. 10/29/15  Yes Runell Gess, MD  rosuvastatin (CRESTOR) 20 MG tablet Take 1 tablet (20 mg total) by mouth daily. 01/07/16  Yes Shanker Levora Dredge, MD  sertraline (ZOLOFT) 25 MG tablet Take 25 mg by mouth daily.   Yes Historical Provider, MD  Ubiquinol 100 MG CAPS Take 100 mg by mouth.   Yes Historical Provider, MD    Review of Systems    As above, since his last visit he has been doing well without chest pain, dyspnea, palpitations, PND, orthopnea, dizziness, sick B, edema, or early satiety..  All other systems reviewed and are otherwise negative except as noted above.  Physical Exam    VS:  BP (!) 160/82   Pulse 64   Ht 5\' 11"  (1.803 m)   Wt 205 lb 6.4 oz (93.2 kg)   BMI 28.65 kg/m  , BMI Body mass index is 28.65 kg/m. GEN: Well nourished, well developed, in no acute distress.  HEENT: normal.  Neck: Supple, no JVD, carotid bruits, or masses. Cardiac: Irregular, no murmurs, rubs, or gallops. No clubbing, cyanosis, edema.  Radials/DP/PT 2+ and equal bilaterally.  Respiratory:  Respirations regular and unlabored, clear to auscultation bilaterally. GI: Soft, obese, nontender, nondistended, BS + x 4. MS: no deformity or atrophy. Skin: warm and dry, no rash. Neuro:  Strength and sensation are intact. Psych: Normal affect.  Accessory Clinical Findings    ECG - Atrial flutter, 64 diffuse ST and T changes. No acute changes.  Assessment & Plan    1.  Atrial flutter with bradycardia and pauses: Patient recently complained of intermittent brief episodes of lightheadedness and was placed on event monitor in August 1. He has had  multiple transmissions from [employer] related to both the presence of a flutter and bradycardia with pauses of up to approximately 3 seconds. The vast majority of his bradycardia has occurred during the early morning hours between approximately 2 and 4 AM. We have one page from yesterday afternoon between 2 and 3 PM, however patient says he believes he was napping at that point. Regardless, he was not symptomatic. He does have a history of sleep apnea and does not tolerate wearing the mask and therefore has not been using it. I suspect this is playing a role in his nocturnal bradycardia. I have reviewed his case with Dr. Herbie Baltimore today. In the absence of symptomatic bradycardia during periods of wakefulness, we will continue monitoring at this time. We will refer to electrophysiology as he very likely has conduction disease in  the setting of atrial flutter with bradycardia in the absence of AV nodal blockade.  2. Hypertension: Blood pressure is elevated today. He is on amlodipine therapy. I will not change his regimen given history of lightheadedness and bradycardia. I'd rather him be mildly hypertensive and hypotensive. Next  3. Hyperlipidemia: He remains on statin therapy. Next  4. Follow-up with electrophysiology and continue event monitoring.   Nicolasa Ducking, NP 04/20/2016, 5:16 PM

## 2016-04-20 NOTE — Patient Instructions (Signed)
Ward Givens, NP, recommends that you continue on your current medications as directed. Please refer to the Current Medication list given to you today.  You have been referred to Dr Loman Brooklyn, an electrophysiologist, for evaluation of bradycardia, atrial flutter, and pre-syncope.  If you need a refill on your cardiac medications before your next appointment, please call your pharmacy.

## 2016-04-21 ENCOUNTER — Telehealth: Payer: Self-pay | Admitting: Cardiovascular Disease

## 2016-04-21 NOTE — Telephone Encounter (Signed)
error 

## 2016-04-23 ENCOUNTER — Telehealth: Payer: Self-pay | Admitting: Physician Assistant

## 2016-04-23 NOTE — Telephone Encounter (Signed)
Patient with past medical history of chronic atrial fibrillation/atrial flutter not on any AV nodal blocking agent. Patient by [employer] as he had a 3.2 second pause at 7:31 AM this morning while in afib. Upon the patient was asleep and was asymptomatic. Upon awakening his current heart rate is ranging in the 40s to 60s. I have called the patient myself, he denies any passing out spells. I have advised the patient that he feels he feel like he was going to pass out he should seek medical attention immediately. Otherwise he has been referred to our electrophysiologist who will discuss options with him including observation versus pacemaker therapy.  Ramond DialSigned, Sharmaine Bain PA Pager: [phone]

## 2016-04-25 ENCOUNTER — Telehealth: Payer: Self-pay | Admitting: Internal Medicine

## 2016-04-25 NOTE — Telephone Encounter (Signed)
On Call Cardiology   [employer] called. Patient had a 3.1 sec pause. Was in AF with controlled V rate before and after the pause. He has persistent AF and is on Xarellto. He was sleeping at the time of pause. [employer] had already talked to the patient and he was asymptomatic.    Adrian BloodgoodAmir Ziare Orrick, MD

## 2016-04-26 ENCOUNTER — Telehealth: Payer: Self-pay | Admitting: Physician Assistant

## 2016-04-26 NOTE — Telephone Encounter (Signed)
    I was called by [employer] for report of up to 3 second pauses during the night. Review of last office note shows that patient is known to have atrial flutter with up to 3 second pauses. These have been asymptomatic and at night. The [employer] representative asked if we could reset parameters so we were not alerted so often. We changed parameters for reporting over 4 second pauses. I provided this verbal order.  Cline CrockKathryn Barry Culverhouse PA-C  [facility]

## 2016-04-27 ENCOUNTER — Telehealth: Payer: Self-pay | Admitting: Cardiovascular Disease

## 2016-04-27 ENCOUNTER — Telehealth: Payer: Self-pay | Admitting: Internal Medicine

## 2016-04-27 NOTE — Telephone Encounter (Signed)
Spoke to FlorissantLydia from [facility] that this patient had AF at 30bpm for about 90 seconds. He was asymptomatic and asleep during the episode.

## 2016-04-27 NOTE — Telephone Encounter (Signed)
Received call from [employer] regarding an abnormal EKG:  Patient had episode of slow Afib with a rate of <30 bpm for >30 seconds.  Called patient-reports he feels completely fine, no changes, denies dizziness/lightheadedness, asymptomatic.  Reports he had been sleeping prior to my call.   Hx: PAF on Xarelto.  [employer] to fax EKG to office for MD review.  Patient was seen on 8/3 by Ward Givenshris Berge NP for Bradycardia-referred to EP.    Will route to MD Allyson SabalBerry and primary RN.     Patient advised to call with any questions or concerns. Verbalized understanding.

## 2016-04-27 NOTE — Telephone Encounter (Signed)
Patient has appt with Dr Elberta Fortisamnitz on 05/08/16 and Dr Allyson SabalBerry on 05/26/16.  Will route to Dr Allyson SabalBerry for advice as to if these appointments are adequate timing for the patient's situation.

## 2016-04-27 NOTE — Telephone Encounter (Signed)
New message       Calling to report an abnormal reading on his monitor

## 2016-04-27 NOTE — Telephone Encounter (Signed)
Appointments are OK. Pt is assymptomatic

## 2016-05-02 ENCOUNTER — Telehealth: Payer: Self-pay | Admitting: *Deleted

## 2016-05-02 NOTE — Telephone Encounter (Signed)
Dr Allyson [facility]alBerry signed [facility] for the following concerning patient's event monitor: 1- Notify for pause 4 sec or greater and 2- Notify for slow a fib at a rate of 30bpm or less for 90 sec.  Physician order sheets faxed to [phone].

## 2016-05-03 ENCOUNTER — Other Ambulatory Visit: Payer: Self-pay

## 2016-05-03 MED ORDER — RIVAROXABAN 20 MG PO TABS: ORAL_TABLET | ORAL | 1 refills | Status: DC

## 2016-05-08 ENCOUNTER — Encounter: Payer: Self-pay | Admitting: Cardiology

## 2016-05-08 ENCOUNTER — Ambulatory Visit (INDEPENDENT_AMBULATORY_CARE_PROVIDER_SITE_OTHER): Payer: Medicare Other | Admitting: Cardiology

## 2016-05-08 VITALS — BP 162/84 | HR 62 | Ht 71.0 in | Wt 215.8 lb

## 2016-05-08 DIAGNOSIS — I483 Typical atrial flutter: Secondary | ICD-10-CM | POA: Diagnosis not present

## 2016-05-08 LAB — CK: CK TOTAL: 49 U/L (ref 7–232)

## 2016-05-08 NOTE — Progress Notes (Signed)
Electrophysiology Office Note   Date:  05/08/2016   ID:  Adrian Melendez, DOB 11/09/1934, MRN [id number]  PCP:  Garlan FillersPATERSON,DANIEL G, MD  Cardiologist:  Allyson SabalBerry Primary Electrophysiologist:  Will Jorja LoaMartin Camnitz, MD    Chief Complaint  Patient presents with  . New Patient (Initial Visit)    brady, a flutter     History of Present Illness: Adrian DrownWilliam R Brester is a 81 y.o. male who presents today for electrophysiology evaluation.   Hx PAF, hypertension, hyperlipidemia, and h/o CVA.   Prior Myoview July 2013, was nonischemic. However, event monitor did show PAF with fairly long pauses.  He says that he has been feeling well without any major complaint. Per his family, though, he has been having episodes of fatigue. His family says that he has been sleeping more. He does have sleep apnea and has been trying to get his CPAP correctly fitted.   Today, he denies symptoms of palpitations, chest pain, shortness of breath, orthopnea, PND, lower extremity edema, claudication, dizziness, presyncope, syncope, bleeding, or neurologic sequela. The patient is tolerating medications without difficulties and is otherwise without complaint today.    Past Medical History:  Diagnosis Date  . Atrial flutter (HCC)    a. On xarelto  . CKD (chronic kidney disease), stage III   . Colon polyps    colonoscopy 2006  . Dizziness 02/29/2012  . Gout   . Gout   . Headache   . History of nuclear stress test 03/28/2012   lexiscan myoview; negative for ischemia  . Hx of migraines   . Hx-TIA (transient ischemic attack) June 2008  . Hyperglycemia   . Hyperlipidemia    statin intolerance  . Hyperlipidemia   . Hypertension   . Migraine aura without headache   . Migraines    classic ophthalmic  . Syncope, near 02/29/2012   a. Resulting in discontinuation of beta blocker.   Past Surgical History:  Procedure Laterality Date  . COLONOSCOPY     2006,11/12  . EYE SURGERY     laser surgery to seal vein   .  TRANSTHORACIC ECHOCARDIOGRAM  03/01/2012   EF 55-60% with normal systolic function; mild AV regurg; calcified mitral annulus - ordered for syncope     Current Outpatient Prescriptions  Medication Sig Dispense Refill  . allopurinol (ZYLOPRIM) 300 MG tablet Take 450 mg by mouth daily. Take one and a half tablets (450mg ) daily    . ALPRAZolam (XANAX) 0.5 MG tablet Take 0.25 mg by mouth at bedtime. Take one half tab at bedtime  1  . amLODipine (NORVASC) 5 MG tablet Take 1 tablet (5 mg total) by mouth daily. 90 tablet 3  . aspirin 81 MG tablet Take 81 mg by mouth daily.    . divalproex (DEPAKOTE ER) 500 MG 24 hr tablet TAKE 1 TABLET BY MOUTH EVERY DAY 30 tablet 1  . rivaroxaban (XARELTO) 20 MG TABS tablet TAKE 1 TABLET BY MOUTH DAILY WITH SUPPER. 30 tablet 1  . rosuvastatin (CRESTOR) 20 MG tablet Take 1 tablet (20 mg total) by mouth daily. 30 tablet 0  . sertraline (ZOLOFT) 25 MG tablet Take 25 mg by mouth daily.    [name]. Ubiquinol 100 MG CAPS Take 100 mg by mouth.     No current facility-administered medications for this visit.     Allergies:   Statins   Social History:  The patient  reports that he has never smoked. He has never used smokeless tobacco. He reports that he does  not drink alcohol or use drugs.   Family History:  The patient's family history includes Cancer in his maternal grandmother; Heart disease in his maternal grandfather; Pneumonia in his paternal grandfather.    ROS:  Please see the history of present illness.   Otherwise, review of systems is positive for palpitations, leg swelling, muscle pain and weakness, balance problems, anxiety.   All other systems are reviewed and negative.    PHYSICAL EXAM: VS:  BP (!) 162/84   Pulse 62   Ht 5\' 11"  (1.803 m)   Wt 215 lb 12.8 oz (97.9 kg)   BMI 30.10 kg/m  , BMI Body mass index is 30.1 kg/m. GEN: Well nourished, well developed, in no acute distress  HEENT: normal  Neck: no JVD, carotid bruits, or masses Cardiac: irregular  rhythm; no murmurs, rubs, or gallops,no edema  Respiratory:  clear to auscultation bilaterally, normal work of breathing GI: soft, nontender, nondistended, + BS MS: no deformity or atrophy  Skin: warm and dry Neuro:  Strength and sensation are intact Psych: euthymic mood, full affect  EKG:  EKG is ordered today. Personal review of the ekg ordered shows atrial flutter, rate 62  Recent Labs: 01/12/2016: ALT 20 01/13/2016: Magnesium 1.9; TSH 3.397 01/15/2016: BUN 21; Creatinine, Ser 1.49; Hemoglobin 13.1; Platelets 196; Potassium 3.5; Sodium 137    Lipid Panel     Component Value Date/Time   CHOL 216 (H) 01/07/2016 0258   TRIG 135 01/07/2016 0258   HDL 31 (L) 01/07/2016 0258   CHOLHDL 7.0 01/07/2016 0258   VLDL 27 01/07/2016 0258   LDLCALC 158 (H) 01/07/2016 0258     Wt Readings from Last 3 Encounters:  05/08/16 215 lb 12.8 oz (97.9 kg)  04/20/16 205 lb 6.4 oz (93.2 kg)  04/14/16 212 lb (96.2 kg)      Other studies Reviewed: Additional studies/ records that were reviewed today include: TTE 01/07/16  Review of the above records today demonstrates:  - Left ventricle: The cavity size was normal. Wall thickness was   increased in a pattern of mild LVH. Systolic function was normal.   The estimated ejection fraction was in the range of 60% to 65%.   Wall motion was normal; there were no regional wall motion   abnormalities. Doppler parameters are consistent with abnormal   left ventricular relaxation (grade 1 diastolic dysfunction). - Aortic valve: There was no stenosis. There was trivial   regurgitation. - Mitral valve: Mildly calcified annulus. There was no significant   regurgitation. - Left atrium: The atrium was mildly dilated. - Right ventricle: The cavity size was normal. Systolic function   was normal. - Right atrium: The atrium was mildly dilated. - Pulmonary arteries: No complete TR doppler jet so unable to   estimate PA systolic pressure.   ASSESSMENT AND  PLAN:  1. Persistent atrial fibrillation/atrial flutter: on Xarelto and aspirin as had stroke on Xarelto. Currently he is quite bradycardic at night with heart rates at times down into the 30s when he is sleeping. He also has episodes of 3 second pauses. During the day, it appears that his heart rate is normal, although we do not have records from this. Due to the fact that his bradycardia is nocturnal, it is unlikely that this is causing him significant issues. He does have episodes of fatigue, and it appears that his atrial fibrillation or flutter started in April. He may benefit from resumption of sinus rhythm. We'll discuss with him in the future the  option of cardioversion. He would likely need a TEE, as he has had a previous stroke on Xarelto.  This patients CHA2DS2-VASc Score and unadjusted Ischemic Stroke Rate (% per year) is equal to 7.2 % stroke rate/year from a score of 5  Above score calculated as 1 point each if present [CHF, HTN, DM, Vascular=MI/PAD/Aortic Plaque, Age if 65-74, or Male] Above score calculated as 2 points each if present [Age > 75, or Stroke/TIA/TE]  2. Hypertension: Currently elevated, but he does report a white coat hypertension. He has also told me that his neurologist would prefer his blood pressure to be elevated as he has had strokes in the past.     Current medicines are reviewed at length with the patient today.   The patient does not have concerns regarding his medicines.  The following changes were made today:  none  Labs/ tests ordered today include:  No orders of the defined types were placed in this encounter.    Disposition:   FU with Will Camnitz 6 months  Signed, Will Jorja Loa, MD  05/08/2016 2:18 PM     [facility] [address] [place] [address] [phone] (office) [phone] (fax)

## 2016-05-08 NOTE — Patient Instructions (Signed)
Medication Instructions:  Your physician recommends that you continue on your current medications as directed. Please refer to the Current Medication list given to you today.  Labwork: Today: CK  Testing/Procedures: None ordered  Follow-Up: Your physician wants you to follow-up in: 6 months with Dr. Elberta Fortisamnitz. You will receive a reminder letter in the mail two months in advance. If you don't receive a letter, please call our office to schedule the follow-up appointment.  If you need a refill on your cardiac medications before your next appointment, please call your pharmacy.  Thank you for choosing [facility]!!   Dory HornSherri Brookelyn Gaynor, RN [phone]

## 2016-05-18 ENCOUNTER — Ambulatory Visit: Payer: Medicare Other | Admitting: Cardiology

## 2016-05-25 ENCOUNTER — Ambulatory Visit: Payer: Medicare Other | Admitting: Neurology

## 2016-05-26 ENCOUNTER — Ambulatory Visit (INDEPENDENT_AMBULATORY_CARE_PROVIDER_SITE_OTHER): Payer: Medicare Other | Admitting: Cardiovascular Disease

## 2016-05-26 ENCOUNTER — Encounter: Payer: Self-pay | Admitting: Cardiovascular Disease

## 2016-05-26 DIAGNOSIS — I480 Paroxysmal atrial fibrillation: Secondary | ICD-10-CM | POA: Diagnosis not present

## 2016-05-26 DIAGNOSIS — I10 Essential (primary) hypertension: Secondary | ICD-10-CM

## 2016-05-26 DIAGNOSIS — E785 Hyperlipidemia, unspecified: Secondary | ICD-10-CM

## 2016-05-26 NOTE — Assessment & Plan Note (Signed)
History of chronic atrial fibrillation with a slow ventricular response sometimes in the 30-40 range especially at night. He has 3 second pauses fairly frequently. He's had A. fib for 3-4 years and I doubt he would be able to be cardioverted. I did refer him to Dr. Otho KetKemnitz for consideration of permanent transvenous pacemaker insertion however he did not feel this was warranted at this time. He is on Xarelto oral coagulation and has a normal hemoglobin.

## 2016-05-26 NOTE — Assessment & Plan Note (Signed)
History of hypertension with blood pressure measured today at 158/62. He is on amlodipine. Continue current meds at current dosing

## 2016-05-26 NOTE — Progress Notes (Signed)
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 His risk factors include hypertension, hyperlipidemia intolerant to statin drugs. He had an episode of presyncope requiring hospitalization back in June. A 2D echo was normal, as was a head CT. A monitor showed PVCs. He was begun on a low dose beta blocker. Myoview performed in our office March 28, 2012, was nonischemic. However, event monitor did show PAF with fairly long pauses which would certainly explain his symptoms. I elected to put him on Xarelto and discontinued his beta blocker.he was hospitalized 01/06/16 for one day and was thought to have a migrainous stroke.he was readmitted several days later with recurrent neurologic symptoms. 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 He was seen by the neurology service who briefly discuss possible intervention but later thought that conservative therapy was more appropriate. He was also seen in consult by cardiology service (Dr. Herbie Baltimore) his blood pressure medicines were adjusted because of relative hypotension. An Event monitor showed episodes of slow ventricular response in the 30-40 range especially in the nighttime hours and sometimes in the afternoon. He also had frequent 3 second pauses . I referred him to Dr. Loman Brooklyn  for consideration of permanent transvenous pacemaker insertion however he did not think this was warranted at this time. He is currently wearing his CPAP machine and feels clinically improved as a result.   Current Outpatient Prescriptions  Medication Sig  Dispense Refill  . allopurinol (ZYLOPRIM) 300 MG tablet Take 450 mg by mouth daily. Take one and a half tablets (450mg ) daily    . ALPRAZolam (XANAX) 0.5 MG tablet Take 0.25 mg by mouth at bedtime. Take one half tab at bedtime  1  . amLODipine (NORVASC) 5 MG tablet Take 1 tablet (5 mg total) by mouth daily. 90 tablet 3  . aspirin 81 MG tablet Take 81 mg by mouth daily.    . divalproex (DEPAKOTE ER) 500 MG 24 hr tablet TAKE 1 TABLET BY MOUTH EVERY DAY 30 tablet 1  . rivaroxaban (XARELTO) 20 MG TABS tablet TAKE 1 TABLET BY MOUTH DAILY WITH SUPPER. 30 tablet 1  . rosuvastatin (CRESTOR) 20 MG tablet Take 1 tablet (20 mg total) by mouth daily. 30 tablet 0  . sertraline (ZOLOFT) 25 MG tablet Take 25 mg by mouth daily.    Adrian Melendez Ubiquinol 100 MG CAPS Take 100 mg by mouth.     No current facility-administered medications for this visit.     Allergies  Allergen Reactions  . Statins     Cause leg weakness     Social History   Social History  . Marital status: Married    Spouse name: N/A  . Number of children: 1  . Years of education: [employer]   Occupational History  . retired Risk analyst    Social History Main Topics  . Smoking status: Never Smoker  . Smokeless tobacco: Never Used  . Alcohol use No  . Drug use: No  . Sexual activity: Yes   Other Topics Concern  . Not on file   Social History Narrative   Consumes no  caffeine     Review of Systems: General: negative for chills, fever, night sweats or weight changes.  Cardiovascular: negative for chest pain, dyspnea on exertion, edema, orthopnea, palpitations, paroxysmal nocturnal dyspnea or shortness of breath Dermatological: negative for rash Respiratory: negative for cough or wheezing Urologic: negative for hematuria Abdominal: negative for nausea, vomiting, diarrhea, bright red blood per rectum, melena, or hematemesis Neurologic: negative for visual changes, syncope, or dizziness All other systems reviewed and are otherwise  negative except as noted above.    Blood pressure (!) 158/62, pulse (!) 53, height 5\' 11"  (1.803 m), weight 213 lb (96.6 kg).  General appearance: alert and no distress Neck: no adenopathy, no carotid bruit, no JVD, supple, symmetrical, trachea midline and thyroid not enlarged, symmetric, no tenderness/mass/nodules Lungs: clear to auscultation bilaterally Heart: irregularly irregular rhythm Extremities: extremities normal, atraumatic, no cyanosis or edema  EKG not performed today  ASSESSMENT AND PLAN:   HTN (hypertension) History of hypertension with blood pressure measured today at 158/62. He is on amlodipine. Continue current meds at current dosing  Dyslipidemia History of dyslipidemia on statin therapy with recent lipid profile performed 01/07/16 revealing a total cholesterol 216, LDL 158 and HDL of 31.   PAF (paroxysmal atrial fibrillation) by Holter June 2012 History of chronic atrial fibrillation with a slow ventricular response sometimes in the 30-40 range especially at night. He has 3 second pauses fairly frequently. He's had A. fib for 3-4 years and I doubt he would be able to be cardioverted. I did refer him to Dr. Otho KetKemnitz for consideration of permanent transvenous pacemaker insertion however he did not feel this was warranted at this time. He is on Xarelto oral coagulation and has a normal hemoglobin.      Adrian GessJonathan J. Orman Matsumura MD FACP,FACC,FAHA, [facility] 05/26/2016 3:44 PM

## 2016-05-26 NOTE — Assessment & Plan Note (Signed)
History of dyslipidemia on statin therapy with recent lipid profile performed 01/07/16 revealing a total cholesterol 216, LDL 158 and HDL of 31.

## 2016-05-26 NOTE — Patient Instructions (Signed)
Medication Instructions:  NO CHANGES.   Follow-Up: Your physician wants you to follow-up in: 6 MONTHS WITH DR BERRY. You will receive a reminder letter in the mail two months in advance. If you don't receive a letter, please call our office to schedule the follow-up appointment.  If you need a refill on your cardiac medications before your next appointment, please call your pharmacy.     

## 2016-05-26 NOTE — Telephone Encounter (Signed)
Please close Encounter °

## 2016-07-03 ENCOUNTER — Other Ambulatory Visit: Payer: Self-pay | Admitting: Cardiovascular Disease

## 2016-07-14 ENCOUNTER — Telehealth: Payer: Self-pay | Admitting: Cardiovascular Disease

## 2016-07-14 NOTE — Telephone Encounter (Signed)
Spoke with daughter she states that she will fill out their part of the form and drop it off for us to fill out our part and will wait for call back for her to do what is next.

## 2016-07-14 NOTE — Telephone Encounter (Signed)
New Message  Pt daughter call requesting to speak with RN about getting the pt a form for an assistance program for medication Xarelto. Please call back to discuss

## 2016-07-18 NOTE — Telephone Encounter (Signed)
Received incoming phone call from daughter. Asked if she could bring some kind of social security statement showing his social security income that it is needed to complete the patient assistance form. Also, let her know that once Dr Allyson SabalBerry is back in office next week, he will review and sign his portion and we will send off the documents once we received and have completed all portions. She verbalized understanding and will bring the information to office.

## 2016-07-18 NOTE — Telephone Encounter (Signed)
Received paperwork from front desk staff that daughter, Sandi MariscalKim Wendt, had dropped off the Patient Assistance Application for Xarelto for the patient.  Called and left message with daughter to call back to discuss the application.

## 2016-07-26 NOTE — Telephone Encounter (Signed)
Follow up     FYI Calling to let the nurse know she will drop off info to the office today in order to get forms completed.

## 2016-07-26 NOTE — Telephone Encounter (Signed)
Documents received in sealed envelope from front desk that patient daughter dropped off. Dr Allyson SabalBerry to sign documents.

## 2016-07-28 NOTE — Telephone Encounter (Signed)
Documents signed by Dr Allyson SabalBerry and faxed successfully to [phone].  Patient's daughter notified that process is complete and will need to wait for response from the patient assistance program at this time. She did not need any of the documents back.

## 2016-08-01 ENCOUNTER — Ambulatory Visit (INDEPENDENT_AMBULATORY_CARE_PROVIDER_SITE_OTHER): Payer: Medicare Other | Admitting: Neurology

## 2016-08-01 ENCOUNTER — Encounter: Payer: Self-pay | Admitting: Neurology

## 2016-08-01 VITALS — BP 158/62 | HR 50 | Ht 71.0 in | Wt 221.0 lb

## 2016-08-01 DIAGNOSIS — I10 Essential (primary) hypertension: Secondary | ICD-10-CM | POA: Diagnosis not present

## 2016-08-01 DIAGNOSIS — I6322 Cerebral infarction due to unspecified occlusion or stenosis of basilar arteries: Secondary | ICD-10-CM | POA: Diagnosis not present

## 2016-08-01 DIAGNOSIS — G43109 Migraine with aura, not intractable, without status migrainosus: Secondary | ICD-10-CM | POA: Diagnosis not present

## 2016-08-01 DIAGNOSIS — I679 Cerebrovascular disease, unspecified: Secondary | ICD-10-CM

## 2016-08-01 DIAGNOSIS — Z7901 Long term (current) use of anticoagulants: Secondary | ICD-10-CM | POA: Diagnosis not present

## 2016-08-01 DIAGNOSIS — I480 Paroxysmal atrial fibrillation: Secondary | ICD-10-CM | POA: Diagnosis not present

## 2016-08-01 NOTE — Progress Notes (Signed)
STROKE NEUROLOGY FOLLOW UP NOTE  NAME: Adrian Melendez DOB: 02/01/1935  REASON FOR VISIT: stroke follow up HISTORY FROM: pt and daughter and granddaughter  Today we had the pleasure of seeing Adrian DrownWilliam R Sweetser in follow-up at our Neurology Clinic. Pt was accompanied by daughter and granddaughter.   History Summary Adrian. Adrian Melendez is a 81 y.o. male with history of atrial fibrillation on xarelto, ocular migraines was admitted on 01/06/16 for visual auras. MRI showed multiple punctate acute infarcts at right occipital lobe as well as old right cerebellar, left pontine and b/l BG lacunes. 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 CUS, TTE unremarkable. LDL 158 and A1C 6.3. Stroke considered d/t afib vs migrainous infarct vs symptomatic BA stenosis. He was continued on Xarelto, added ASA 81mg  and started on crestor 20 as well as depakote for migraine prevention.   He was readmitted on 01/12/16 due to transient right UE and LE numbness. MRI showed new punctate right PCA and left MCA/PCA territory infarcts. Found to have orthostatic hypotension. Stroke considered as symptomatic BA, VA, PCA stenosis in the setting of orthostasis. His other home BP meds discontinued, only on norvasc 5mg , recommended BP goal 130-160 and compression socks. Continued on ASA and Xarelto. His visual aura much improved with depakote and BP stabilized. He was discharged in good condition.   Follow up 03/29/16 - the patient has been doing well. Only had one episode of right lip tingling lasting 2 sec. Had 2 episodes of visual aura with wavy lines lasting 3-4 secs, but no HA. He still has intermittent lightheadedness, could be positional such as standing up or sitting up, or evening sometimes sitting in chair. Usually brief. BP normally 140-150, on compression socks. Today BP 134/60. On Xarelto and ASA 81 and crestor 20 without side effects.   Interval History During the  interval time, pt has been doing well. Complain some hand fine tremor, did not bother his ADLs. He also reported transient leg tremor, could happen with lying in bed sometimes, but resolved since resumed CPAP for OSA. BP at home 130-160 range. Had 30 day cardiac monitoring with Dr. Gery PrayBarry and showed afib with slow VR and frequent 3+ secs pauses. He was referred to EP but felt pacemaker is not necessary at this time. Continue on ASA, Xarelto and crestor without side effect. BP today 158/62  REVIEW OF SYSTEMS: Full 14 system review of systems performed and notable only for those listed below and in HPI above, all others are negative:  Constitutional:   Cardiovascular: leg swelling Ear/Nose/Throat:  Ringing in ears Skin:  Eyes:   Respiratory:   Gastroitestinal:   Genitourinary:  Hematology/Lymphatic:   Endocrine:  Musculoskeletal:   Allergy/Immunology:   Neurological:   tremors Psychiatric:  Sleep:  daytime sleepiness  The following represents the patient's updated allergies and side effects list: Allergies  Allergen Reactions  . Statins     Cause leg weakness     The neurologically relevant items on the patient's problem list were reviewed on today's visit.  Neurologic Examination  A problem focused neurological exam (12 or more points of the single system neurologic examination, vital signs counts as 1 point, cranial nerves count for 8 points) was performed.  Blood pressure (!) 158/62, pulse (!) 50, height 5\' 11"  (1.803 m), weight 221 lb (100.2 kg).  General - Well nourished, well developed, in no apparent distress.  Ophthalmologic - Fundi not visualized due to eye movement.  Cardiovascular - Regular rate and rhythm.  Mental Status -  Level of arousal and orientation to time, place, and person were intact. Language including expression, naming, repetition, comprehension was assessed and found intact. Fund of Knowledge was assessed and was intact.  Cranial Nerves II - XII  - II - Visual field intact OU. III, IV, VI - Extraocular movements intact. V - Facial sensation intact bilaterally. VII - Facial movement intact bilaterally. VIII - Hearing & vestibular intact bilaterally. X - Palate elevates symmetrically. XI - Chin turning & shoulder shrug intact bilaterally. XII - Tongue protrusion intact.  Motor Strength - The patient's strength was normal in all extremities and pronator drift was absent.  Bulk was normal and fasciculations were absent.   Motor Tone - Muscle tone was assessed at the neck and appendages and was normal.  Reflexes - The patient's reflexes were 1+ in all extremities and he had no pathological reflexes.  Sensory - Light touch, temperature/pinprick were assessed and were normal.    Coordination - The patient had normal movements in the hands and feet with no ataxia or dysmetria.  Mild high frequency action and postural tremor b/l hands.  Gait and Station - slow and small stride, but stable.   Data reviewed: I personally reviewed the images and agree with the radiology interpretations.  Adrian Melendez Wo Contrast 01/06/2016 1. The Right PCA is within normal limits. 2. Severe proximal Basilar artery stenosis, and severe Left PCA origin stenosis have developed since 2008. 3. 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 Right PICA remains patent. The left vertebral artery is chronically dominant. 4. The anterior circulation is more stable since 2008, with only mild bilateral MCA origin stenoses.   Adrian Brain Wo Contrast 01/06/2016 At least 3 subcentimeter foci of probable acute ischemia RIGHT occipital lobe. Focal loss of RIGHT vertebral artery V4 flow void concerning for focal occlusion. Moderate to severe chronic small vessel ischemic disease. Old small RIGHT cerebellar and LEFT pontine infarcts. Old bilateral basal ganglia lacunar infarcts.   Carotid Doppler  There is 1-39% bilateral ICA  stenosis. Vertebral artery flow is antegrade.  TTE - Normal LV size with mild LV hypertrophy. EF 60-65%. Normal RV  size and systolic function. No significant valvular  abnormalities.  Ct Melendez Code Stroke W/o Cm 01/12/2016 1. No acute finding. 2. Small cortical infarcts seen on brain MRI 6 days prior are not visible. No hemorrhagic conversion or evidence of infarct extension. 3. Chronic microvascular disease.   Adrian Brain Wo Contrast  01/12/2016 IMPRESSION: New punctate foci of acute infarction in the left temporal lobe and right posterior parietal lobe. These are again consistent with micro embolic infarctions. No swelling, mass effect or acute hemorrhage. Extensive old small vessel insults elsewhere throughout the brain, some associated with hemosiderin deposition.   Ct Angio Melendez and Neck W/cm &/or Wo/cm  01/12/2016 IMPRESSION: CT Melendez: Known acute LEFT temporoparietal lobe infarcts not apparent by CT. Chronic changes including old LEFT basal ganglia lacunar infarct and moderate chronic small vessel ischemic disease. 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 Atherosclerosis without hemodynamically significant stenosis of the carotid arteries. CTA Melendez: Stable angiographic appearance of the brain: Chronically occluded distal RIGHT V4 segment. High-grade stenosis proximal basilar artery. Tandem high-grade stenoses LEFT posterior cerebral artery. Moderate stenosis RIGHT P2 segment. Moderate stenosis RIGHT M1 segment.   30 day cardiac event monitoring - Afib with slow VR  and freq 3 + sec pauses. Needs ROV with me  Component     Latest Ref Rng 01/05/2016 01/07/2016 01/13/2016  Cholesterol     0 - 200 mg/dL 161223 (H) 096216 (H)   Triglycerides     <150 mg/dL 045157 (H) 409135   HDL Cholesterol     >40 mg/dL 34 (L) 31 (L)   Total CHOL/HDL Ratio      6.6 7.0   VLDL     0 - 40 mg/dL 31 27   LDL (calc)     0 - 99 mg/dL 811158 (H) 914158 (H)     Hemoglobin A1C     4.8 - 5.6 %  6.3 (H)   Mean Plasma Glucose       134   Vitamin B12     180 - 914 pg/mL   296  Folate     >5.9 ng/mL   20.0  TSH     0.350 - 4.500 uIU/mL   3.397    Assessment: As you may recall, he is a 81 y.o. Caucasian male with PMH of atrial fibrillation on xarelto, ocular migraines was admitted on 01/06/16 for visual auras. MRI showed multiple punctate acute infarcts at right occipital lobe as well as old right cerebellar, left pontine and b/l BG lacunes. 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 CUS, TTE unremarkable. LDL 158 and A1C 6.3. Stroke considered d/t afib vs migrainous infarct vs symptomatic BA stenosis. He was continued on Xarelto, added ASA 81mg  and started on crestor 20 as well as depakote for migraine prevention. He was readmitted on 01/12/16 due to transient right UE and LE numbness. MRI showed new punctate right PCA and left MCA/PCA territory infarcts. Found to have orthostatic hypotension. Stroke considered as symptomatic BA, VA, PCA stenosis in the setting of orthostasis. Recommended BP goal 130-160 and compression socks. His visual aura much improved with depakote. On Xarelto and ASA 81 and crestor 20 without side effects. Has mild action and postural tremor, not bothering ADLs. 30 day cardiac event monitoring showed afib with slow VR. No need pacer at this time. BP stable on the goal. B12 = 296, recommend supplement  Plan:  - continue Xarelto and ASA and crestor for stroke and cardiac prevention - check BP at home and BP goal 130-160 - Follow up with your primary care physician for stroke risk factor modification. Recommend maintain blood pressure goal 130-160/80, diabetes with hemoglobin A1c goal below 7.0% and lipids with LDL cholesterol goal below 70 mg/dL.  - continue depakote for migraine prevention - follow up with cardiology.  - recommend B12 supplement [phone] mcg daily - follow up in 6 months.    I spent more than 25 minutes of face to face time with the patient. Greater than 50% of time was spent in counseling and coordination of care.    No orders of the defined types were placed in this encounter.   Meds ordered this encounter  Medications  . gatifloxacin (ZYMAXID) 0.5 % SOLN    Sig: INSTILL 1 DROP IN OS QID.    Refill:  6  . ketorolac (ACULAR) 0.5 % ophthalmic solution    Sig: INSTILL 1 DROP IN OS QID.    Refill:  6  . prednisoLONE acetate (PRED FORTE) 1 % ophthalmic suspension    Sig: INSTILL 1 DROP IN OS QID.    Refill:  6    Patient Instructions  - continue Xarelto and ASA  and crestor for stroke and cardiac prevention - check BP at home and BP goal 130-160 - Follow up with your primary care physician for stroke risk factor modification. Recommend maintain blood pressure goal 130-160/80, diabetes with hemoglobin A1c goal below 7.0% and lipids with LDL cholesterol goal below 70 mg/dL.  - continue depakote for migraine prevention - follow up with cardiology.  - recommend B12 supplement [phone] microgram daily - follow up in 6 months.    Marvel Plan, MD PhD [facility] [address] [place], [place] [address] [phone]

## 2016-08-01 NOTE — Patient Instructions (Addendum)
-   continue Xarelto and ASA and crestor for stroke and cardiac prevention - check BP at home and BP goal 130-160 - Follow up with your primary care physician for stroke risk factor modification. Recommend maintain blood pressure goal 130-160/80, diabetes with hemoglobin A1c goal below 7.0% and lipids with LDL cholesterol goal below 70 mg/dL.  - continue depakote for migraine prevention - follow up with cardiology.  - recommend B12 supplement [phone] microgram daily - follow up in 6 months.

## 2016-08-07 ENCOUNTER — Other Ambulatory Visit: Payer: Self-pay | Admitting: Neurology

## 2016-08-19 ENCOUNTER — Telehealth: Payer: Self-pay | Admitting: Neurology

## 2016-08-19 NOTE — Telephone Encounter (Signed)
[name]'s daughter Adrian Melendez(Adrian Melendez [phone]) called that her dad was having some fluctuating leg weakness.   BP was ok when checked.   Advised that if significant weakness occurs or if fluctuations continue that he should go to ED but that I would forward a message to Katrina and Dr. Roda ShuttersXu to see about getting him in next week for a visit.  She also mentioned hew stopped his qHS Xanax (0.25 mg) last week and stopped sertraline 25 mg 2-3 days ago.  I did not think that the med chang would explain his symptoms.    Adrian Melendez [employer]

## 2016-08-21 NOTE — Telephone Encounter (Signed)
Patient's daughter is calling back stating the patient is still weak and feels kind of tired and seems to feel worse after he eats. The patient also feels depressed. She would like to discuss medication  sertraline (ZOLOFT) 25 MG tablet that the patient stopped taking.

## 2016-08-21 NOTE — Telephone Encounter (Signed)
Dr Xu please advise 

## 2016-08-22 NOTE — Telephone Encounter (Signed)
Discussed with patient daughter over the phone. She stated that patient recently complained of bilateral leg weakness, especially after eating, feels better when lying down. Blood pressure lying 180s/90s. 10 days ago, patient stopped the Xanax low dose at night for sleeping because CPap very effective for him to get good sleep. He also stopped Zoloft for the last for 5 days. However, Dr. feels like patient has depressed mood over the weekend.  A total daughter that I would recommend resume Zoloft since patient has depressed mood. The Xanax, if he has been off for 10 days without difficulty for sleeping or other agitation or confusion, I would be okay to continue off the medication.  For the bilateral leg weakness after eating, and getting better after lying down, I would concern about orthostatic hypotension. However daughter told me that over the weekend he was in sitting in chair, feeling bad, they checked her blood pressure at the time it was also about 180s systolic. I told her that this condition does not consistent with a stroke, I would like to recommend he to be seen by PCP ASAP to see if there is any other conditions underlying the symptoms. Daughter expressed appreciation and understanding.  Marvel PlanJindong Danett Palazzo, MD PhD Stroke Neurology 08/22/2016 3:40 PM

## 2016-08-22 NOTE — Telephone Encounter (Signed)
Patient's daughter is calling back still waiting for a returned call regarding the patient.

## 2016-08-23 ENCOUNTER — Telehealth: Payer: Self-pay | Admitting: Cardiovascular Disease

## 2016-08-23 NOTE — Telephone Encounter (Signed)
Spoke w daughter, DPR regarding issue. Patient had started back on crestor - not sure if this was by our instruction but no recent labs to review. Caller believes this was restarted at his original dose. Once restarted, he started to develop symptoms of muscle cramping and leg weakness. He notes same problem in the past was reason for stopping med before. I advised to go ahead and hold med for 2 weeks, further instructions to follow from pharmD. Caller voiced understanding and thanks.

## 2016-08-23 NOTE — Telephone Encounter (Signed)
Most recent LDL > 150.  Would suggest an appointment with [facility] in January for possible pcsk-9 or research option

## 2016-08-23 NOTE — Telephone Encounter (Signed)
Spoke to daughter, discussed recommendations. She voiced understanding, we arranged lipid clinic visit and patient will come in early January to discuss. I advised to call in interim if new problems or if these issues don't resolve following the discontinuation of crestor.

## 2016-08-23 NOTE — Telephone Encounter (Signed)
New message      Pt c/o medication issue:  1. Name of Medication:  crestor 2. How are you currently taking this medication (dosage and times per day)? 20mg  3. Are you having a reaction (difficulty breathing--STAT)?  no 4. What is your medication issue?  Pt is having muscle weakness--legs feel like they cannot hold pt up.  Pt had same problem when he took crestor years ago. Please advise

## 2016-08-25 ENCOUNTER — Encounter (HOSPITAL_COMMUNITY): Payer: Self-pay | Admitting: Emergency Medicine

## 2016-08-25 ENCOUNTER — Observation Stay (HOSPITAL_COMMUNITY): Payer: Medicare Other

## 2016-08-25 ENCOUNTER — Emergency Department (HOSPITAL_COMMUNITY): Payer: Medicare Other

## 2016-08-25 ENCOUNTER — Inpatient Hospital Stay (HOSPITAL_COMMUNITY)
Admission: EM | Admit: 2016-08-25 | Discharge: 2016-09-18 | DRG: 064 | Disposition: E | Payer: Medicare Other | Attending: Internal Medicine | Admitting: Internal Medicine

## 2016-08-25 DIAGNOSIS — Z66 Do not resuscitate: Secondary | ICD-10-CM | POA: Diagnosis not present

## 2016-08-25 DIAGNOSIS — G9340 Encephalopathy, unspecified: Secondary | ICD-10-CM | POA: Diagnosis not present

## 2016-08-25 DIAGNOSIS — R1310 Dysphagia, unspecified: Secondary | ICD-10-CM | POA: Diagnosis present

## 2016-08-25 DIAGNOSIS — K5900 Constipation, unspecified: Secondary | ICD-10-CM | POA: Diagnosis present

## 2016-08-25 DIAGNOSIS — J9601 Acute respiratory failure with hypoxia: Secondary | ICD-10-CM | POA: Diagnosis not present

## 2016-08-25 DIAGNOSIS — R339 Retention of urine, unspecified: Secondary | ICD-10-CM

## 2016-08-25 DIAGNOSIS — N390 Urinary tract infection, site not specified: Secondary | ICD-10-CM | POA: Diagnosis present

## 2016-08-25 DIAGNOSIS — I6330 Cerebral infarction due to thrombosis of unspecified cerebral artery: Secondary | ICD-10-CM | POA: Insufficient documentation

## 2016-08-25 DIAGNOSIS — I63443 Cerebral infarction due to embolism of bilateral cerebellar arteries: Secondary | ICD-10-CM | POA: Diagnosis not present

## 2016-08-25 DIAGNOSIS — R0600 Dyspnea, unspecified: Secondary | ICD-10-CM

## 2016-08-25 DIAGNOSIS — E876 Hypokalemia: Secondary | ICD-10-CM

## 2016-08-25 DIAGNOSIS — I10 Essential (primary) hypertension: Secondary | ICD-10-CM | POA: Diagnosis present

## 2016-08-25 DIAGNOSIS — R338 Other retention of urine: Secondary | ICD-10-CM

## 2016-08-25 DIAGNOSIS — I639 Cerebral infarction, unspecified: Secondary | ICD-10-CM

## 2016-08-25 DIAGNOSIS — Z515 Encounter for palliative care: Secondary | ICD-10-CM | POA: Diagnosis not present

## 2016-08-25 DIAGNOSIS — Z23 Encounter for immunization: Secondary | ICD-10-CM

## 2016-08-25 DIAGNOSIS — Z01818 Encounter for other preprocedural examination: Secondary | ICD-10-CM

## 2016-08-25 DIAGNOSIS — Z79899 Other long term (current) drug therapy: Secondary | ICD-10-CM

## 2016-08-25 DIAGNOSIS — Z4659 Encounter for fitting and adjustment of other gastrointestinal appliance and device: Secondary | ICD-10-CM

## 2016-08-25 DIAGNOSIS — N179 Acute kidney failure, unspecified: Secondary | ICD-10-CM | POA: Diagnosis present

## 2016-08-25 DIAGNOSIS — I4892 Unspecified atrial flutter: Secondary | ICD-10-CM | POA: Diagnosis present

## 2016-08-25 DIAGNOSIS — E785 Hyperlipidemia, unspecified: Secondary | ICD-10-CM | POA: Diagnosis present

## 2016-08-25 DIAGNOSIS — R410 Disorientation, unspecified: Secondary | ICD-10-CM

## 2016-08-25 DIAGNOSIS — I609 Nontraumatic subarachnoid hemorrhage, unspecified: Secondary | ICD-10-CM

## 2016-08-25 DIAGNOSIS — I130 Hypertensive heart and chronic kidney disease with heart failure and stage 1 through stage 4 chronic kidney disease, or unspecified chronic kidney disease: Secondary | ICD-10-CM | POA: Diagnosis present

## 2016-08-25 DIAGNOSIS — I480 Paroxysmal atrial fibrillation: Secondary | ICD-10-CM | POA: Diagnosis present

## 2016-08-25 DIAGNOSIS — Z86010 Personal history of colonic polyps: Secondary | ICD-10-CM

## 2016-08-25 DIAGNOSIS — R569 Unspecified convulsions: Secondary | ICD-10-CM | POA: Diagnosis present

## 2016-08-25 DIAGNOSIS — N183 Chronic kidney disease, stage 3 (moderate): Secondary | ICD-10-CM | POA: Diagnosis present

## 2016-08-25 DIAGNOSIS — R6521 Severe sepsis with septic shock: Secondary | ICD-10-CM | POA: Diagnosis not present

## 2016-08-25 DIAGNOSIS — I619 Nontraumatic intracerebral hemorrhage, unspecified: Secondary | ICD-10-CM | POA: Insufficient documentation

## 2016-08-25 DIAGNOSIS — R4182 Altered mental status, unspecified: Secondary | ICD-10-CM

## 2016-08-25 DIAGNOSIS — Z7901 Long term (current) use of anticoagulants: Secondary | ICD-10-CM

## 2016-08-25 DIAGNOSIS — E878 Other disorders of electrolyte and fluid balance, not elsewhere classified: Secondary | ICD-10-CM | POA: Diagnosis not present

## 2016-08-25 DIAGNOSIS — N400 Enlarged prostate without lower urinary tract symptoms: Secondary | ICD-10-CM | POA: Diagnosis present

## 2016-08-25 DIAGNOSIS — E7800 Pure hypercholesterolemia, unspecified: Secondary | ICD-10-CM | POA: Diagnosis present

## 2016-08-25 DIAGNOSIS — L8990 Pressure ulcer of unspecified site, unspecified stage: Secondary | ICD-10-CM | POA: Insufficient documentation

## 2016-08-25 DIAGNOSIS — R531 Weakness: Secondary | ICD-10-CM

## 2016-08-25 DIAGNOSIS — I509 Heart failure, unspecified: Secondary | ICD-10-CM | POA: Diagnosis present

## 2016-08-25 DIAGNOSIS — G4733 Obstructive sleep apnea (adult) (pediatric): Secondary | ICD-10-CM | POA: Diagnosis present

## 2016-08-25 DIAGNOSIS — M109 Gout, unspecified: Secondary | ICD-10-CM | POA: Diagnosis present

## 2016-08-25 DIAGNOSIS — A419 Sepsis, unspecified organism: Secondary | ICD-10-CM | POA: Diagnosis not present

## 2016-08-25 DIAGNOSIS — J690 Pneumonitis due to inhalation of food and vomit: Secondary | ICD-10-CM

## 2016-08-25 DIAGNOSIS — N3001 Acute cystitis with hematuria: Secondary | ICD-10-CM

## 2016-08-25 DIAGNOSIS — R2681 Unsteadiness on feet: Secondary | ICD-10-CM

## 2016-08-25 DIAGNOSIS — G9349 Other encephalopathy: Secondary | ICD-10-CM | POA: Diagnosis present

## 2016-08-25 DIAGNOSIS — I6340 Cerebral infarction due to embolism of unspecified cerebral artery: Secondary | ICD-10-CM | POA: Insufficient documentation

## 2016-08-25 DIAGNOSIS — Z8673 Personal history of transient ischemic attack (TIA), and cerebral infarction without residual deficits: Secondary | ICD-10-CM

## 2016-08-25 DIAGNOSIS — N281 Cyst of kidney, acquired: Secondary | ICD-10-CM | POA: Diagnosis present

## 2016-08-25 DIAGNOSIS — R0902 Hypoxemia: Secondary | ICD-10-CM

## 2016-08-25 HISTORY — DX: Retention of urine, unspecified: R33.9

## 2016-08-25 HISTORY — DX: Other ill-defined heart diseases: I51.89

## 2016-08-25 LAB — CBC WITH DIFFERENTIAL/PLATELET
BASOS ABS: 0 10*3/uL (ref 0.0–0.1)
Basophils Relative: 0 %
EOS ABS: 0 10*3/uL (ref 0.0–0.7)
Eosinophils Relative: 0 %
HEMATOCRIT: 44.1 % (ref 39.0–52.0)
Hemoglobin: 15 g/dL (ref 13.0–17.0)
LYMPHS ABS: 0.8 10*3/uL (ref 0.7–4.0)
Lymphocytes Relative: 7 %
MCH: 31.1 pg (ref 26.0–34.0)
MCHC: 34 g/dL (ref 30.0–36.0)
MCV: 91.5 fL (ref 78.0–100.0)
Monocytes Absolute: 1.2 10*3/uL — ABNORMAL HIGH (ref 0.1–1.0)
Monocytes Relative: 10 %
NEUTROS ABS: 9.5 10*3/uL — AB (ref 1.7–7.7)
Neutrophils Relative %: 83 %
PLATELETS: 198 10*3/uL (ref 150–400)
RBC: 4.82 MIL/uL (ref 4.22–5.81)
RDW: 13.6 % (ref 11.5–15.5)
WBC: 11.5 10*3/uL — AB (ref 4.0–10.5)

## 2016-08-25 LAB — COMPREHENSIVE METABOLIC PANEL
ALBUMIN: 4.1 g/dL (ref 3.5–5.0)
ALT: 27 U/L (ref 17–63)
ANION GAP: 13 (ref 5–15)
AST: 31 U/L (ref 15–41)
Alkaline Phosphatase: 114 U/L (ref 38–126)
BILIRUBIN TOTAL: 1.1 mg/dL (ref 0.3–1.2)
BUN: 24 mg/dL — ABNORMAL HIGH (ref 6–20)
CO2: 23 mmol/L (ref 22–32)
Calcium: 9 mg/dL (ref 8.9–10.3)
Chloride: 104 mmol/L (ref 101–111)
Creatinine, Ser: 1.31 mg/dL — ABNORMAL HIGH (ref 0.61–1.24)
GFR calc Af Amer: 58 mL/min — ABNORMAL LOW (ref >60)
GFR, EST NON AFRICAN AMERICAN: 50 mL/min — AB (ref >60)
Glucose, Bld: 125 mg/dL — ABNORMAL HIGH (ref 65–99)
POTASSIUM: 2.8 mmol/L — AB (ref 3.5–5.1)
Sodium: 140 mmol/L (ref 135–145)
TOTAL PROTEIN: 6.7 g/dL (ref 6.5–8.1)

## 2016-08-25 LAB — URINALYSIS, ROUTINE W REFLEX MICROSCOPIC
Bacteria, UA: NONE SEEN
Bilirubin Urine: NEGATIVE
GLUCOSE, UA: NEGATIVE mg/dL
KETONES UR: NEGATIVE mg/dL
LEUKOCYTES UA: NEGATIVE
Nitrite: NEGATIVE
PROTEIN: 100 mg/dL — AB
Specific Gravity, Urine: 1.009 (ref 1.005–1.030)
Squamous Epithelial / LPF: NONE SEEN
pH: 7 (ref 5.0–8.0)

## 2016-08-25 LAB — MAGNESIUM: Magnesium: 1.7 mg/dL (ref 1.7–2.4)

## 2016-08-25 LAB — CBG MONITORING, ED: GLUCOSE-CAPILLARY: 141 mg/dL — AB (ref 65–99)

## 2016-08-25 LAB — PROTIME-INR
INR: 1.31
Prothrombin Time: 16.3 seconds — ABNORMAL HIGH (ref 11.4–15.2)

## 2016-08-25 LAB — APTT: aPTT: 30 seconds (ref 24–36)

## 2016-08-25 LAB — AMMONIA: Ammonia: 26 umol/L (ref 9–35)

## 2016-08-25 LAB — VITAMIN B12: VITAMIN B 12: 627 pg/mL (ref 180–914)

## 2016-08-25 MED ORDER — SODIUM CHLORIDE 0.9 % IV SOLN
INTRAVENOUS | Status: AC
  Administered 2016-08-25: 14:00:00 via INTRAVENOUS

## 2016-08-25 MED ORDER — SODIUM CHLORIDE 0.9 % IV BOLUS (SEPSIS)
500.0000 mL | Freq: Once | INTRAVENOUS | Status: AC
  Administered 2016-08-25: 500 mL via INTRAVENOUS

## 2016-08-25 MED ORDER — TAMSULOSIN HCL 0.4 MG PO CAPS
0.4000 mg | ORAL_CAPSULE | Freq: Every day | ORAL | Status: DC
  Administered 2016-08-25 – 2016-08-28 (×4): 0.4 mg via ORAL
  Filled 2016-08-25 (×4): qty 1

## 2016-08-25 MED ORDER — POTASSIUM CHLORIDE CRYS ER 20 MEQ PO TBCR
40.0000 meq | EXTENDED_RELEASE_TABLET | Freq: Once | ORAL | Status: AC
  Administered 2016-08-25: 40 meq via ORAL
  Filled 2016-08-25: qty 2

## 2016-08-25 MED ORDER — ACETAMINOPHEN 650 MG RE SUPP
650.0000 mg | Freq: Four times a day (QID) | RECTAL | Status: DC | PRN
  Administered 2016-09-01: 650 mg via RECTAL
  Filled 2016-08-25: qty 1

## 2016-08-25 MED ORDER — ALPRAZOLAM 0.25 MG PO TABS
0.2500 mg | ORAL_TABLET | Freq: Every day | ORAL | Status: DC
  Administered 2016-08-25 – 2016-08-26 (×2): 0.25 mg via ORAL
  Filled 2016-08-25 (×2): qty 1

## 2016-08-25 MED ORDER — DEXTROSE 5 % IV SOLN
1.0000 g | Freq: Once | INTRAVENOUS | Status: AC
  Administered 2016-08-25: 1 g via INTRAVENOUS
  Filled 2016-08-25: qty 10

## 2016-08-25 MED ORDER — ALLOPURINOL 300 MG PO TABS
450.0000 mg | ORAL_TABLET | Freq: Every day | ORAL | Status: DC
  Administered 2016-08-25 – 2016-08-28 (×4): 450 mg via ORAL
  Filled 2016-08-25 (×5): qty 2

## 2016-08-25 MED ORDER — ONDANSETRON HCL 4 MG PO TABS: 4.0000 mg | ORAL_TABLET | Freq: Four times a day (QID) | ORAL | Status: DC | PRN

## 2016-08-25 MED ORDER — RIVAROXABAN 20 MG PO TABS
20.0000 mg | ORAL_TABLET | Freq: Every day | ORAL | Status: DC
  Administered 2016-08-25 – 2016-08-30 (×6): 20 mg via ORAL
  Filled 2016-08-25 (×6): qty 1

## 2016-08-25 MED ORDER — SERTRALINE HCL 25 MG PO TABS
25.0000 mg | ORAL_TABLET | Freq: Every day | ORAL | Status: DC
  Administered 2016-08-25: 25 mg via ORAL
  Filled 2016-08-25 (×2): qty 1

## 2016-08-25 MED ORDER — GATIFLOXACIN 0.5 % OP SOLN
1.0000 [drp] | Freq: Four times a day (QID) | OPHTHALMIC | Status: DC
  Filled 2016-08-25: qty 2.5

## 2016-08-25 MED ORDER — ROSUVASTATIN CALCIUM 20 MG PO TABS: 20.0000 mg | ORAL_TABLET | Freq: Every day | ORAL | Status: DC

## 2016-08-25 MED ORDER — ASPIRIN EC 81 MG PO TBEC
81.0000 mg | DELAYED_RELEASE_TABLET | Freq: Every day | ORAL | Status: DC
  Administered 2016-08-25 – 2016-08-28 (×4): 81 mg via ORAL
  Filled 2016-08-25 (×5): qty 1

## 2016-08-25 MED ORDER — DEXTROSE 5 % IV SOLN
1.0000 g | INTRAVENOUS | Status: DC
  Administered 2016-08-26 – 2016-08-28 (×3): 1 g via INTRAVENOUS
  Filled 2016-08-25 (×4): qty 10

## 2016-08-25 MED ORDER — AMLODIPINE BESYLATE 5 MG PO TABS
5.0000 mg | ORAL_TABLET | Freq: Every day | ORAL | Status: DC
  Administered 2016-08-25: 5 mg via ORAL
  Filled 2016-08-25 (×3): qty 1

## 2016-08-25 MED ORDER — ACETAMINOPHEN 325 MG PO TABS: 650.0000 mg | ORAL_TABLET | Freq: Four times a day (QID) | ORAL | Status: DC | PRN

## 2016-08-25 MED ORDER — DEXTROSE 5 % IV SOLN
1.0000 g | INTRAVENOUS | Status: DC
  Filled 2016-08-25: qty 10

## 2016-08-25 MED ORDER — ONDANSETRON HCL 4 MG/2ML IJ SOLN: 4.0000 mg | Freq: Four times a day (QID) | INTRAMUSCULAR | Status: DC | PRN

## 2016-08-25 MED ORDER — DIVALPROEX SODIUM ER 500 MG PO TB24
500.0000 mg | ORAL_TABLET | Freq: Every day | ORAL | Status: DC
  Administered 2016-08-25 – 2016-08-29 (×5): 500 mg via ORAL
  Filled 2016-08-25 (×7): qty 1

## 2016-08-25 MED ORDER — KETOROLAC TROMETHAMINE 0.5 % OP SOLN
1.0000 [drp] | Freq: Four times a day (QID) | OPHTHALMIC | Status: DC
  Filled 2016-08-25: qty 3

## 2016-08-25 MED ORDER — PREDNISOLONE ACETATE 1 % OP SUSP
1.0000 [drp] | Freq: Four times a day (QID) | OPHTHALMIC | Status: DC
  Filled 2016-08-25: qty 1

## 2016-08-25 NOTE — ED Provider Notes (Signed)
[facility]-EMERGENCY DEPT Provider Note   CSN: [id number] Arrival date & time: Sep 16, 2016  0825     History   Chief Complaint Chief Complaint  Patient presents with  . Weakness     HPI   Blood pressure 182/98, pulse 75, temperature 97.9 F (36.6 C), temperature source Oral, SpO2 97 %.  Adrian Melendez is a 81 y.o. male with past medical history significant for atrial flutter (Xarelto, Which is administered by wife), hypertension, hyperlipidemia, CVA Who lives at home, normally ambulates independently with no assistance complaining of generalized fatigue indolent worsening over the last several weeks. He's been having difficulties urinating over the last day with slight dribbling this morning, has never had any prostate issues and never required indwelling Foley cath. Patient was also waxing and waning in confusion over the last day. He is normally very sharp, no history of dementia. He's been having lower extremity weakness has and has been ambulating with a walker which is atypical for his baseline. Family denies fever, chills, nausea, vomiting. They endorse a generally decreased by mouth intake.  Past Medical History:  Diagnosis Date  . Atrial flutter (HCC)    a. On xarelto  . CKD (chronic kidney disease), stage III   . Colon polyps    colonoscopy 2006  . Diastolic dysfunction   . Dizziness 02/29/2012  . Gout   . Gout   . Headache   . History of nuclear stress test 03/28/2012   lexiscan myoview; negative for ischemia  . Hx of migraines   . Hx-TIA (transient ischemic attack) June 2008  . Hyperglycemia   . Hyperlipidemia    statin intolerance  . Hyperlipidemia   . Hypertension   . Hypokalemia   . Migraine aura without headache   . Migraines    classic ophthalmic  . Stroke (HCC)   . Syncope, near 02/29/2012   a. Resulting in discontinuation of beta blocker.  . Urinary retention     Patient Active Problem List   Diagnosis Date Noted  . Acute encephalopathy 2016/09/16    . Generalized weakness 09-16-2016  . UTI (urinary tract infection) Sep 16, 2016  . Urinary retention 2016/09/16  . Cerebrovascular accident (CVA) due to embolism of cerebral artery (HCC) 03/29/2016  . 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 Paroxysmal atrial fibrillation (HCC) 03/29/2016  . Intracranial vascular stenosis 03/29/2016  . Anticoagulant long-term use 03/29/2016  . Essential hypertension 03/29/2016  . Ocular migraine 03/29/2016  . Orthostatic hypotension   . HLD (hyperlipidemia) 01/12/2016  . Hypokalemia 01/12/2016  . TIA (transient ischemic attack) 01/12/2016  . CKD (chronic kidney disease)   . Stroke (HCC)   . Migraine with aura and without status migrainosus, not intractable   . Cerebrovascular accident (CVA) due to bilateral thrombosis of posterior cerebral arteries (HCC)   . Stroke (cerebrum) (HCC) 01/06/2016  . CVA (cerebral infarction) 01/06/2016  . Acute CVA (cerebrovascular accident) (HCC) 01/06/2016  . PAF (paroxysmal atrial fibrillation) by Holter June 2012 05/28/2013  . Chronic anticoagulation- Xarelto 05/28/2013  . Dyslipidemia 05/28/2013  . Dizziness associated with nausea june 2013- suspected to be secondary to PAF with pauses 02/29/2012  . Syncope, near- June 2013 02/29/2012  . HTN (hypertension) 02/29/2012  . Hx-TIA (transient ischemic attack) June 2008 02/29/2012    Past Surgical History:  Procedure Laterality Date  . COLONOSCOPY     2006,11/12  . EYE SURGERY     laser surgery to seal vein   . TRANSTHORACIC ECHOCARDIOGRAM  03/01/2012  EF 55-60% with normal systolic function; mild AV regurg; calcified mitral annulus - ordered for syncope       Home Medications    Prior to Admission medications   Medication Sig Start Date End Date Taking? Authorizing Provider  allopurinol (ZYLOPRIM) 300 MG tablet Take 450 mg by mouth daily. Take one and a half tablets (450mg ) daily    Historical Provider, MD  amLODipine  (NORVASC) 5 MG tablet Take 1 tablet (5 mg total) by mouth daily. 03/10/16   Runell Gess, MD  aspirin 81 MG tablet Take 81 mg by mouth daily.    Historical Provider, MD  divalproex (DEPAKOTE ER) 500 MG 24 hr tablet TAKE 1 TABLET EVERY DAY 08/07/16   Marvel Plan, MD  gatifloxacin (ZYMAXID) 0.5 % SOLN INSTILL 1 DROP IN OS QID. 07/13/16   Historical Provider, MD  ketorolac (ACULAR) 0.5 % ophthalmic solution INSTILL 1 DROP IN OS QID. 07/14/16   Historical Provider, MD  prednisoLONE acetate (PRED FORTE) 1 % ophthalmic suspension INSTILL 1 DROP IN OS QID. 07/13/16   Historical Provider, MD  sertraline (ZOLOFT) 25 MG tablet Take 25 mg by mouth daily.    Historical Provider, MD  Ubiquinol 100 MG CAPS Take 100 mg by mouth.    Historical Provider, MD  XARELTO 20 MG TABS tablet TAKE 1 TABLET BY MOUTH DAILY WITH SUPPER 07/03/16   Runell Gess, MD    Family History Family History  Problem Relation Age of Onset  . Cancer Maternal Grandmother   . Heart disease Maternal Grandfather   . Pneumonia Paternal Grandfather     Social History Social History  Substance Use Topics  . Smoking status: Never Smoker  . Smokeless tobacco: Never Used  . Alcohol use No     Allergies   Statins   Review of Systems Review of Systems  10 systems reviewed and found to be negative, except as noted in the HPI.  Physical Exam Updated Vital Signs BP 189/98   Pulse 65   Temp 97.9 F (36.6 C) (Oral)   Resp 17   SpO2 98%   Physical Exam  Constitutional: He appears well-developed and well-nourished. No distress.  HENT:  Head: Normocephalic and atraumatic.  Mouth/Throat: Oropharynx is clear and moist.  MMM  Eyes: Conjunctivae and EOM are normal. Pupils are equal, round, and reactive to light.  Neck: Normal range of motion.  Cardiovascular: Normal rate.   Irregular  Pulmonary/Chest: Effort normal and breath sounds normal.  Abdominal: Soft. There is no tenderness.  Musculoskeletal: Normal range of  motion.  Neurological: He is alert.  Oriented to person and place but not time.  Strength 4/54 extremities  Follows commands, Clear, goal oriented speech, Strength is 5 out of 5x4 extremities. Sensation is grossly intact.   Skin: He is not diaphoretic.  Psychiatric: He has a normal mood and affect.  Nursing note and vitals reviewed.    ED Treatments / Results  Labs (all labs ordered are listed, but only abnormal results are displayed) Labs Reviewed  COMPREHENSIVE METABOLIC PANEL - Abnormal; Notable for the following:       Result Value   Potassium 2.8 (*)    Glucose, Bld 125 (*)    BUN 24 (*)    Creatinine, Ser 1.31 (*)    GFR calc non Af Amer 50 (*)    GFR calc Af Amer 58 (*)    All other components within normal limits  URINALYSIS, ROUTINE W REFLEX MICROSCOPIC - Abnormal; Notable for  the following:    Hgb urine dipstick LARGE (*)    Protein, ur 100 (*)    All other components within normal limits  PROTIME-INR - Abnormal; Notable for the following:    Prothrombin Time 16.3 (*)    All other components within normal limits  CBG MONITORING, ED - Abnormal; Notable for the following:    Glucose-Capillary 141 (*)    All other components within normal limits  URINE CULTURE  MAGNESIUM  APTT  AMMONIA  CBC WITH DIFFERENTIAL/PLATELET  VITAMIN B12  FOLATE RBC  RPR    EKG  EKG Interpretation  Date/Time:  Friday August 25 2016 08:34:57 EST Ventricular Rate:  71 PR Interval:    QRS Duration: 115 QT Interval:  433 QTC Calculation: 471 R Axis:   -10 Text Interpretation:  Atrial fibrillation Nonspecific intraventricular conduction delay Probable anterior infarct, age indeterminate Repol abnrm, severe global ischemia (LM/MVD) SINCE LAST TRACING HEART RATE HAS INCREASED Confirmed by Ethelda ChickJACUBOWITZ  MD, SAM [phone]([id number]) on 08/26/2016 8:45:47 AM Also confirmed by Ethelda ChickJACUBOWITZ  MD, SAM [phone]), editor [place] CT, Jola BabinskiMarilyn [phone]([id number])  on 08/24/2016 9:27:29 AM       Radiology Dg Chest 2  View  Result Date: 09/14/2016 CLINICAL DATA:  Cough. EXAM: CHEST  2 VIEW COMPARISON:  02/29/2012 . FINDINGS: Mediastinum hilar structures normal. Mild bibasilar subsegmental atelectasis and/or scarring. No change from prior exam. Stable cardiomegaly. No pulmonary venous congestion. IMPRESSION: 1. Mild bibasilar subsegmental atelectasis and/or scarring again noted. No change. 2. Stable cardiomegaly.  No acute abnormality. Electronically Signed   By: Maisie Fushomas  Register   On: 08/22/2016 10:11   Ct Head Wo Contrast  Result Date: 09/12/2016 CLINICAL DATA:  Family states over the last two weeks the patient has become very lethargic and his legs very weak. Patient is normally able to ambulate with a walker but the family states they have had difficulty getting the patient to ambulate. EXAM: CT HEAD WITHOUT CONTRAST TECHNIQUE: Contiguous axial images were obtained from the base of the skull through the vertex without intravenous contrast. COMPARISON:  CT head 01/12/2016 FINDINGS: Brain: There is significant central and cortical atrophy. Periventricular white matter changes are extensive in appears stable. Chronic lacunar infarct of the left basal ganglia. There is no intra or extra-axial fluid collection or mass lesion. The basilar cisterns and ventricles have a normal appearance. There is no CT evidence for acute infarction or hemorrhage. Vascular: There is atherosclerotic calcification of the carotid siphons. Skull: Normal. Negative for fracture or focal lesion. Sinuses/Orbits: Mild mucosal thickening noted within the paranasal sinuses. Orbits are unremarkable. Mastoid air cells are normally aerated. Other: None IMPRESSION: 1. Significant atrophy and small vessel disease, stable in appearance. 2. Chronic lacunar infarct of the left basal ganglia. 3.  No evidence for acute intracranial abnormality. 4. Mild mucosal thickening of the paranasal sinuses. Electronically Signed   By: Norva PavlovElizabeth  Brown M.D.   On: 08/26/2016  10:34    Procedures Procedures (including critical care time)  Medications Ordered in ED Medications  divalproex (DEPAKOTE ER) 24 hr tablet 500 mg (not administered)  gatifloxacin (ZYMAXID) 0.5 % ophthalmic drops 1 drop (not administered)  ketorolac (ACULAR) 0.5 % ophthalmic solution 1 drop (not administered)  prednisoLONE acetate (PRED FORTE) 1 % ophthalmic suspension 1 drop (not administered)  rivaroxaban (XARELTO) tablet 20 mg (not administered)  aspirin tablet 81 mg (not administered)  ALPRAZolam (XANAX) tablet 0.25 mg (not administered)  sertraline (ZOLOFT) tablet 25 mg (not administered)  amLODipine (NORVASC) tablet 5 mg (not administered)  rosuvastatin (CRESTOR) tablet 20 mg (not administered)  allopurinol (ZYLOPRIM) tablet 450 mg (not administered)  0.9 %  sodium chloride infusion (not administered)  acetaminophen (TYLENOL) tablet 650 mg (not administered)    Or  acetaminophen (TYLENOL) suppository 650 mg (not administered)  ondansetron (ZOFRAN) tablet 4 mg (not administered)    Or  ondansetron (ZOFRAN) injection 4 mg (not administered)  cefTRIAXone (ROCEPHIN) 1 g in dextrose 5 % 50 mL IVPB (not administered)  potassium chloride SA (K-DUR,KLOR-CON) CR tablet 40 mEq (not administered)  sodium chloride 0.9 % bolus 500 mL (0 mLs Intravenous Stopped 08/29/2016 1143)  potassium chloride SA (K-DUR,KLOR-CON) CR tablet 40 mEq (40 mEq Oral Given 08/24/2016 1104)  cefTRIAXone (ROCEPHIN) 1 g in dextrose 5 % 50 mL IVPB (1 g Intravenous New Bag/Given 08/23/2016 1143)     Initial Impression / Assessment and Plan / ED Course  I have reviewed the triage vital signs and the nursing notes.  Pertinent labs & imaging results that were available during my care of the patient were reviewed by me and considered in my medical decision making (see chart for details).  Clinical Course     Vitals:   09/02/2016 1103 09/07/2016 1115 09/02/2016 1130 08/29/2016 1145  BP: 170/76 172/90 194/77 189/98  Pulse: (!)  58 (!) 58 (!) 59 65  Resp:  16 16 17   Temp:      TempSrc:      SpO2: 97% 96% 96% 98%    Medications  divalproex (DEPAKOTE ER) 24 hr tablet 500 mg (not administered)  gatifloxacin (ZYMAXID) 0.5 % ophthalmic drops 1 drop (not administered)  ketorolac (ACULAR) 0.5 % ophthalmic solution 1 drop (not administered)  prednisoLONE acetate (PRED FORTE) 1 % ophthalmic suspension 1 drop (not administered)  rivaroxaban (XARELTO) tablet 20 mg (not administered)  aspirin tablet 81 mg (not administered)  ALPRAZolam (XANAX) tablet 0.25 mg (not administered)  sertraline (ZOLOFT) tablet 25 mg (not administered)  amLODipine (NORVASC) tablet 5 mg (not administered)  rosuvastatin (CRESTOR) tablet 20 mg (not administered)  allopurinol (ZYLOPRIM) tablet 450 mg (not administered)  0.9 %  sodium chloride infusion (not administered)  acetaminophen (TYLENOL) tablet 650 mg (not administered)    Or  acetaminophen (TYLENOL) suppository 650 mg (not administered)  ondansetron (ZOFRAN) tablet 4 mg (not administered)    Or  ondansetron (ZOFRAN) injection 4 mg (not administered)  cefTRIAXone (ROCEPHIN) 1 g in dextrose 5 % 50 mL IVPB (not administered)  potassium chloride SA (K-DUR,KLOR-CON) CR tablet 40 mEq (not administered)  sodium chloride 0.9 % bolus 500 mL (0 mLs Intravenous Stopped 08/29/2016 1143)  potassium chloride SA (K-DUR,KLOR-CON) CR tablet 40 mEq (40 mEq Oral Given 08/21/2016 1104)  cefTRIAXone (ROCEPHIN) 1 g in dextrose 5 % 50 mL IVPB (1 g Intravenous New Bag/Given 09/17/2016 1143)    BRONCO MCGRORY is 81 y.o. male presenting with Worsening generalized fatigue and developing confusion onset yesterday, patient cannot ambulate independently. He has not urinated in 1 day, patient could not urinate on his own, bladder scan shows greater than 1 L. In and out catheter placed with greater 1300 mL of concentrated urine, small clot at the end. EKG with no acute findings.  Urinalysis is not consistent with infection,  negative nitrite, negative leukocyte however there is too numerous to count whites and reds, likely reactive from obstruction. Creatinine is not elevated. Potassium low at 2.8, creatinine elevated at 1.3, BUN also mildly elevated at 24, likely dehydration. Patient given 500 mL bolus.  Head CT without acute  findings.  Patient will need admission for altered hypokalemia, altered mental status, urinary retention, generalized weakness with inability to ambulate.  Discussed with triad hospitalist NP Black who accepts admission with attending Dr. Melynda RippleHobbs.  Final Clinical Impressions(s) / ED Diagnoses   Final diagnoses:  Acute urinary retention  Acute cystitis with hematuria  Hypokalemia  Weakness  Confusion    New Prescriptions New Prescriptions   No medications on file     Wynetta Emeryicole Eleonor Ocon, PA-C 08/28/2016 1554    Doug SouSam Jacubowitz, MD 09/02/2016 1649

## 2016-08-25 NOTE — ED Notes (Signed)
ED Provider at bedside. 

## 2016-08-25 NOTE — Progress Notes (Signed)
Agree with previous assessment . Daughter at bedside . Pt anxious he was given xanax . He is currently comfortable and asleep

## 2016-08-25 NOTE — ED Notes (Signed)
Patient unable to urinate.  Family states this has been an ongoing problem for several days that patient states he feels like he needs to void but is unable.

## 2016-08-25 NOTE — H&P (Signed)
History and Physical    Adrian Melendez:[id number] DOB: 05-05-1935 DOA: 2016/08/30  PCP: Garlan Fillers, MD Patient coming from: home  Chief Complaint: generalized weakness/intermittent confusion/difficulty urinating  HPI: Adrian Melendez is a 81 y.o. male with medical history significant for stroke, A. Fib, diastolic dysfunction, hypertension, migraines, presents to the emergency department with the chief complaint of difficulty urinating and 2 week history of gradual generalized weakness intermittent confusion. Initial evaluation reveals urinary retention, urinary tract infection, hypokalemia.  Information is obtained from the patient and his granddaughter who is at the bedside noting that information from patient is unreliable secondary to intermittent confusion. Granddaughter states since Thanksgiving patient having difficulty urinating. During that time he's gradually become lethargic and developed generalized weakness. I several days granddaughter reports intermittent episodes of "disorientation". No reports of any recent falls. She does report that he typically ambulates independently and has needed a cane the last couple days and today was unable to ambulate at all. Patient denies any abdominal pain back pain dysuria hematuria frequency or urgency. He denies chest pain palpitation shortness of breath headache dizziness syncope or near-syncope. No reports of any nausea or vomiting no diarrhea fever chills. He has been eating and drinking has been sleeping "more than usual". No hx prostate problems. Bedside bladder scan revealed greater than 999 mils   ED Course: In the emergency department he's afebrile hemodynamically stable and not hypoxic. He received Rocephin and a Foley catheter  Review of Systems: As per HPI otherwise 10 point review of systems negative.   Ambulatory Status: Usually ambulates independently no recent falls  Past Medical History:  Diagnosis Date  .  Atrial flutter (HCC)    a. On xarelto  . CKD (chronic kidney disease), stage III   . Colon polyps    colonoscopy 2006  . Diastolic dysfunction   . Dizziness 02/29/2012  . Gout   . Gout   . Headache   . History of nuclear stress test 03/28/2012   lexiscan myoview; negative for ischemia  . Hx of migraines   . Hx-TIA (transient ischemic attack) June 2008  . Hyperglycemia   . Hyperlipidemia    statin intolerance  . Hyperlipidemia   . Hypertension   . Hypokalemia   . Migraine aura without headache   . Migraines    classic ophthalmic  . Stroke (HCC)   . Syncope, near 02/29/2012   a. Resulting in discontinuation of beta blocker.  . Urinary retention     Past Surgical History:  Procedure Laterality Date  . COLONOSCOPY     2006,11/12  . EYE SURGERY     laser surgery to seal vein   . TRANSTHORACIC ECHOCARDIOGRAM  03/01/2012   EF 55-60% with normal systolic function; mild AV regurg; calcified mitral annulus - ordered for syncope    Social History   Social History  . Marital status: Married    Spouse name: N/A  . Number of children: 1  . Years of education: [employer]   Occupational History  . retired Risk analyst    Social History Main Topics  . Smoking status: Never Smoker  . Smokeless tobacco: Never Used  . Alcohol use No  . Drug use: No  . Sexual activity: Yes   Other Topics Concern  . Not on file   Social History Narrative   Consumes no caffeine    Allergies  Allergen Reactions  . Statins     Cause leg weakness     Family History  Problem  Relation Age of Onset  . Cancer Maternal Grandmother   . Heart disease Maternal Grandfather   . Pneumonia Paternal Grandfather     Prior to Admission medications   Medication Sig Start Date End Date Taking? Authorizing Provider  allopurinol (ZYLOPRIM) 300 MG tablet Take 450 mg by mouth daily. Take one and a half tablets (450mg ) daily    Historical Provider, MD  amLODipine (NORVASC) 5 MG tablet Take 1 tablet (5 mg  total) by mouth daily. 03/10/16   Runell Gess, MD  aspirin 81 MG tablet Take 81 mg by mouth daily.    Historical Provider, MD  divalproex (DEPAKOTE ER) 500 MG 24 hr tablet TAKE 1 TABLET EVERY DAY 08/07/16   Marvel Plan, MD  sertraline (ZOLOFT) 25 MG tablet Take 25 mg by mouth daily.    Historical Provider, MD  Ubiquinol 100 MG CAPS Take 100 mg by mouth.    Historical Provider, MD  XARELTO 20 MG TABS tablet TAKE 1 TABLET BY MOUTH DAILY WITH SUPPER 07/03/16   Runell Gess, MD    Physical Exam: Vitals:   09-01-16 1103 2016/09/01 1115 09-01-2016 1130 Sep 01, 2016 1145  BP: 170/76 172/90 194/77 189/98  Pulse: (!) 58 (!) 58 (!) 59 65  Resp:  16 16 17   Temp:      TempSrc:      SpO2: 97% 96% 96% 98%     General:  Appears calm and comfortable, no acute distress Eyes:  PERRL, EOMI, normal lids, iris ENT:  grossly normal hearing, lips & tongue, mucous membranes of his mouth are moist and pink Neck:  no LAD, masses or thyromegaly Cardiovascular:  Irregularly irregular, no m/r/g. No LE edema. Pedal pulses present and palpable Respiratory:  CTA bilaterally, no w/r/r. Normal respiratory effort. Abdomen:  soft, ntnd, obese positive bowel sounds throughout no guarding or rebounding Skin:  no rash or induration seen on limited exam Musculoskeletal:  grossly normal tone BUE/BLE, good ROM, no bony abnormality Psychiatric:  grossly normal mood and affect, speech fluent and appropriate, AOx3 Neurologic:  CN 2-12 grossly intact, moves all extremities in coordinated fashion, sensation intact oriented to self and place only. Follows commands answers questions be at slowly. Moves all extremities bilateral grip 4 out of 5  Labs on Admission: I have personally reviewed following labs and imaging studies  CBC: No results for input(s): WBC, NEUTROABS, HGB, HCT, MCV, PLT in the last 168 hours. Basic Metabolic Panel:  Recent Labs Lab 09-01-2016 0931  NA 140  K 2.8*  CL 104  CO2 23  GLUCOSE 125*  BUN 24*    CREATININE 1.31*  CALCIUM 9.0  MG 1.7   GFR: CrCl cannot be calculated (Unknown ideal weight.). Liver Function Tests:  Recent Labs Lab 09/01/2016 0931  AST 31  ALT 27  ALKPHOS 114  BILITOT 1.1  PROT 6.7  ALBUMIN 4.1   No results for input(s): LIPASE, AMYLASE in the last 168 hours.  Recent Labs Lab September 01, 2016 1120  AMMONIA 26   Coagulation Profile:  Recent Labs Lab 09/01/16 1120  INR 1.31   Cardiac Enzymes: No results for input(s): CKTOTAL, CKMB, CKMBINDEX, TROPONINI in the last 168 hours. BNP (last 3 results) No results for input(s): PROBNP in the last 8760 hours. HbA1C: No results for input(s): HGBA1C in the last 72 hours. CBG:  Recent Labs Lab 01-Sep-2016 0845  GLUCAP 141*   Lipid Profile: No results for input(s): CHOL, HDL, LDLCALC, TRIG, CHOLHDL, LDLDIRECT in the last 72 hours. Thyroid Function Tests:  No results for input(s): TSH, T4TOTAL, FREET4, T3FREE, THYROIDAB in the last 72 hours. Anemia Panel: No results for input(s): VITAMINB12, FOLATE, FERRITIN, TIBC, IRON, RETICCTPCT in the last 72 hours. Urine analysis:    Component Value Date/Time   COLORURINE YELLOW 02-08-2016 0942   APPEARANCEUR CLEAR 02-08-2016 0942   LABSPEC 1.009 02-08-2016 0942   PHURINE 7.0 02-08-2016 0942   GLUCOSEU NEGATIVE 02-08-2016 0942   HGBUR LARGE (A) 02-08-2016 0942   BILIRUBINUR NEGATIVE 02-08-2016 0942   KETONESUR NEGATIVE 02-08-2016 0942   PROTEINUR 100 (A) 02-08-2016 0942   UROBILINOGEN 0.2 03/04/2007 0857   NITRITE NEGATIVE 02-08-2016 0942   LEUKOCYTESUR NEGATIVE 02-08-2016 0942    Creatinine Clearance: CrCl cannot be calculated (Unknown ideal weight.).  Sepsis Labs: @LABRCNTIP (procalcitonin:4,lacticidven:4) )No results found for this or any previous visit (from the past 240 hour(s)).   Radiological Exams on Admission: Dg Chest 2 View  Result Date: 09/16/2016 CLINICAL DATA:  Cough. EXAM: CHEST  2 VIEW COMPARISON:  02/29/2012 . FINDINGS: Mediastinum hilar  structures normal. Mild bibasilar subsegmental atelectasis and/or scarring. No change from prior exam. Stable cardiomegaly. No pulmonary venous congestion. IMPRESSION: 1. Mild bibasilar subsegmental atelectasis and/or scarring again noted. No change. 2. Stable cardiomegaly.  No acute abnormality. Electronically Signed   By: Maisie Fushomas  Register   On: 02-08-2016 10:11   Ct Head Wo Contrast  Result Date: 09/10/2016 CLINICAL DATA:  Family states over the last two weeks the patient has become very lethargic and his legs very weak. Patient is normally able to ambulate with a walker but the family states they have had difficulty getting the patient to ambulate. EXAM: CT HEAD WITHOUT CONTRAST TECHNIQUE: Contiguous axial images were obtained from the base of the skull through the vertex without intravenous contrast. COMPARISON:  CT head 01/12/2016 FINDINGS: Brain: There is significant central and cortical atrophy. Periventricular white matter changes are extensive in appears stable. Chronic lacunar infarct of the left basal ganglia. There is no intra or extra-axial fluid collection or mass lesion. The basilar cisterns and ventricles have a normal appearance. There is no CT evidence for acute infarction or hemorrhage. Vascular: There is atherosclerotic calcification of the carotid siphons. Skull: Normal. Negative for fracture or focal lesion. Sinuses/Orbits: Mild mucosal thickening noted within the paranasal sinuses. Orbits are unremarkable. Mastoid air cells are normally aerated. Other: None IMPRESSION: 1. Significant atrophy and small vessel disease, stable in appearance. 2. Chronic lacunar infarct of the left basal ganglia. 3.  No evidence for acute intracranial abnormality. 4. Mild mucosal thickening of the paranasal sinuses. Electronically Signed   By: Norva PavlovElizabeth  Brown M.D.   On: 02-08-2016 10:34    EKG: Independently reviewed. Atrial fibrillation Nonspecific intraventricular conduction delay Probable anterior  infarct, age indeterminate   Assessment/Plan Principal Problem:   Acute encephalopathy Active Problems:   HLD (hyperlipidemia)   Hypokalemia   Paroxysmal atrial fibrillation (HCC)   Essential hypertension   Generalized weakness   UTI (urinary tract infection)   Urinary retention   #1. Acute encephalopathy likely related to urinary tract infection in the setting of urinary retention. CT of the head no evidence for acute infarct or hemorrhage. No metabolic derangement. He is afebrile. Urinalysis consistent with UTI. Chest x-ray table cardiomegaly no infiltrate. CBC pending. -Admit to MedSurg -Gentle IV fluids -Rocephin -Follow urine culture -foley catheter -Obtain a renal ultrasound -Obtain a vitamin B-12 folate and RPR -We'll likely need to go home with Foley and follow-up with urology outpatient depending on renal ultrasound results  #2. Urinary retention.  Etiology unclear. Patient denies history of BPH. patient denies urinary retention on admission, however the family reports difficulty urinating over the last 2 weeks. Bladder scan at bedside revealed greater than 999 mils of urine - Foley catheter -Renal ultrasound -start flomax -See #1  #3. Urinary tract infection. Likely related to #2. He is afebrile and nontoxic appearing on admission -Follow urine culture -Continue the Rocephin that was initiated in the emergency department -Gentle IV fluids -Continue Foley  #4. Generalized weakness. Likely related to above. -IV fluids -Antibiotics as noted above -Physical therapy  #5. Hypokalemia. Likely related to above. Potassium level 2.9 on admission. He was given 40 mEq in the emergency department. Magnesium level within the limits of normal -Repeat potassium supplementation 40 mEq 1 -Recheck in the morning  #6. Hypertension. Blood pressure on the high end of normal in the emergency department. Home medications include amlodipine -We'll resume home meds -Monitor  closely  #7. A. fib/flutter on Xarelto. Hx slow ventricular response. Chart review indicates recent office visit with cardiology. chadvasc score 5. Chart review indicates Holter monitor evaluation revealed 3 second pauses fairly frequently. He was evaluated for permanent transvenous pacemaker but Dr Otho KetKemnitz did not feel it was warranted. -continue Xarelto    DVT prophylaxis: sd  Code Status: full  Family Communication: granddaughter at bedside  Disposition Plan: home hopefully 24-36 hours  Consults called: none  Admission status: obs    Gwenyth BenderBLACK,Rashan Rounsaville M MD [facility]  If 7PM-7AM, please contact night-coverage [email or web address] Password [email or web address]  08/31/2016, 12:17 PM

## 2016-08-25 NOTE — ED Triage Notes (Signed)
[facility]  Family states over the last two weeks the patient has become very lethargic and his legs very weak.  Patient is normally able to ambulate with a walker but the family states they have had difficulty getting the patient to ambulate.  Family states the patient "just eats and sleeps and sometimes gets disoriented".  Patient is oriented to self only.

## 2016-08-25 NOTE — ED Provider Notes (Signed)
Family reports progressive generalized weakness over approximately 2 weeks. Patient presently denies any complaint. On exam alert no distress no facial asymmetry lungs clear auscultation heart regular rate and rhythm and obese, nontender extremities no edema. Neurologic results from his Nose 2 through 12 grossly intact. Skin warm dry no rash   Doug SouSam Aviyanna Colbaugh, MD 07/07/16 1134

## 2016-08-25 NOTE — ED Notes (Signed)
Pt CBG, 141. Nurse was notified. 

## 2016-08-25 NOTE — ED Notes (Signed)
NP Black requesting that foley catheter be placed as soon as possible for urinary retention. (*See prior bladder scan results)

## 2016-08-25 NOTE — ED Notes (Signed)
Pt provided urinal, stating he feels like he needs to urinate.

## 2016-08-25 NOTE — ED Notes (Signed)
Bladder scanned pt, read >92899mL Nurse was notified.

## 2016-08-26 DIAGNOSIS — Z515 Encounter for palliative care: Secondary | ICD-10-CM | POA: Diagnosis not present

## 2016-08-26 DIAGNOSIS — N3001 Acute cystitis with hematuria: Secondary | ICD-10-CM | POA: Diagnosis present

## 2016-08-26 DIAGNOSIS — G9349 Other encephalopathy: Secondary | ICD-10-CM | POA: Diagnosis present

## 2016-08-26 DIAGNOSIS — I6312 Cerebral infarction due to embolism of basilar artery: Secondary | ICD-10-CM | POA: Diagnosis not present

## 2016-08-26 DIAGNOSIS — E878 Other disorders of electrolyte and fluid balance, not elsewhere classified: Secondary | ICD-10-CM | POA: Diagnosis not present

## 2016-08-26 DIAGNOSIS — R569 Unspecified convulsions: Secondary | ICD-10-CM | POA: Diagnosis present

## 2016-08-26 DIAGNOSIS — I63443 Cerebral infarction due to embolism of bilateral cerebellar arteries: Secondary | ICD-10-CM | POA: Diagnosis present

## 2016-08-26 DIAGNOSIS — E876 Hypokalemia: Secondary | ICD-10-CM | POA: Diagnosis present

## 2016-08-26 DIAGNOSIS — N183 Chronic kidney disease, stage 3 (moderate): Secondary | ICD-10-CM | POA: Diagnosis present

## 2016-08-26 DIAGNOSIS — J9601 Acute respiratory failure with hypoxia: Secondary | ICD-10-CM | POA: Diagnosis not present

## 2016-08-26 DIAGNOSIS — G4733 Obstructive sleep apnea (adult) (pediatric): Secondary | ICD-10-CM | POA: Diagnosis present

## 2016-08-26 DIAGNOSIS — J690 Pneumonitis due to inhalation of food and vomit: Secondary | ICD-10-CM | POA: Diagnosis not present

## 2016-08-26 DIAGNOSIS — R6521 Severe sepsis with septic shock: Secondary | ICD-10-CM | POA: Diagnosis not present

## 2016-08-26 DIAGNOSIS — I130 Hypertensive heart and chronic kidney disease with heart failure and stage 1 through stage 4 chronic kidney disease, or unspecified chronic kidney disease: Secondary | ICD-10-CM | POA: Diagnosis present

## 2016-08-26 DIAGNOSIS — I509 Heart failure, unspecified: Secondary | ICD-10-CM | POA: Diagnosis present

## 2016-08-26 DIAGNOSIS — Z23 Encounter for immunization: Secondary | ICD-10-CM | POA: Diagnosis present

## 2016-08-26 DIAGNOSIS — N179 Acute kidney failure, unspecified: Secondary | ICD-10-CM | POA: Diagnosis present

## 2016-08-26 DIAGNOSIS — I6789 Other cerebrovascular disease: Secondary | ICD-10-CM | POA: Diagnosis not present

## 2016-08-26 DIAGNOSIS — K5900 Constipation, unspecified: Secondary | ICD-10-CM | POA: Diagnosis present

## 2016-08-26 DIAGNOSIS — N281 Cyst of kidney, acquired: Secondary | ICD-10-CM | POA: Diagnosis present

## 2016-08-26 DIAGNOSIS — I4892 Unspecified atrial flutter: Secondary | ICD-10-CM | POA: Diagnosis present

## 2016-08-26 DIAGNOSIS — A419 Sepsis, unspecified organism: Secondary | ICD-10-CM | POA: Diagnosis not present

## 2016-08-26 DIAGNOSIS — R1310 Dysphagia, unspecified: Secondary | ICD-10-CM | POA: Diagnosis present

## 2016-08-26 DIAGNOSIS — I480 Paroxysmal atrial fibrillation: Secondary | ICD-10-CM | POA: Diagnosis present

## 2016-08-26 DIAGNOSIS — R338 Other retention of urine: Secondary | ICD-10-CM | POA: Diagnosis not present

## 2016-08-26 DIAGNOSIS — E7800 Pure hypercholesterolemia, unspecified: Secondary | ICD-10-CM | POA: Diagnosis present

## 2016-08-26 DIAGNOSIS — M109 Gout, unspecified: Secondary | ICD-10-CM | POA: Diagnosis present

## 2016-08-26 DIAGNOSIS — G9340 Encephalopathy, unspecified: Secondary | ICD-10-CM | POA: Diagnosis not present

## 2016-08-26 DIAGNOSIS — N400 Enlarged prostate without lower urinary tract symptoms: Secondary | ICD-10-CM | POA: Diagnosis present

## 2016-08-26 LAB — CBC
HCT: 40.7 % (ref 39.0–52.0)
Hemoglobin: 13.6 g/dL (ref 13.0–17.0)
MCH: 30.8 pg (ref 26.0–34.0)
MCHC: 33.4 g/dL (ref 30.0–36.0)
MCV: 92.1 fL (ref 78.0–100.0)
Platelets: 174 K/uL (ref 150–400)
RBC: 4.42 MIL/uL (ref 4.22–5.81)
RDW: 13.8 % (ref 11.5–15.5)
WBC: 9.6 K/uL (ref 4.0–10.5)

## 2016-08-26 LAB — BASIC METABOLIC PANEL
Anion gap: 11 (ref 5–15)
BUN: 18 mg/dL (ref 6–20)
CO2: 24 mmol/L (ref 22–32)
CREATININE: 1.01 mg/dL (ref 0.61–1.24)
Calcium: 8.7 mg/dL — ABNORMAL LOW (ref 8.9–10.3)
Chloride: 107 mmol/L (ref 101–111)
GFR calc Af Amer: >60 mL/min (ref >60)
GLUCOSE: 107 mg/dL — AB (ref 65–99)
POTASSIUM: 3.4 mmol/L — AB (ref 3.5–5.1)
Sodium: 142 mmol/L (ref 135–145)

## 2016-08-26 LAB — URINE CULTURE: CULTURE: NO GROWTH

## 2016-08-26 LAB — SYPHILIS: RPR W/REFLEX TO RPR TITER AND TREPONEMAL ANTIBODIES, TRADITIONAL SCREENING AND DIAGNOSIS ALGORITHM: RPR Ser Ql: NONREACTIVE

## 2016-08-26 MED ORDER — HALOPERIDOL LACTATE 5 MG/ML IJ SOLN: 2.0000 mg | Freq: Once | INTRAMUSCULAR | Status: DC

## 2016-08-26 MED ORDER — POLYETHYLENE GLYCOL 3350 17 G PO PACK
17.0000 g | PACK | Freq: Two times a day (BID) | ORAL | Status: DC
  Administered 2016-08-26: 17 g via ORAL
  Filled 2016-08-26 (×3): qty 1

## 2016-08-26 MED ORDER — POTASSIUM CHLORIDE CRYS ER 20 MEQ PO TBCR
40.0000 meq | EXTENDED_RELEASE_TABLET | Freq: Once | ORAL | Status: AC
  Administered 2016-08-26: 40 meq via ORAL
  Filled 2016-08-26: qty 2

## 2016-08-26 MED ORDER — POTASSIUM CHLORIDE CRYS ER 20 MEQ PO TBCR
20.0000 meq | EXTENDED_RELEASE_TABLET | Freq: Once | ORAL | Status: AC
  Administered 2016-08-26: 20 meq via ORAL
  Filled 2016-08-26: qty 1

## 2016-08-26 MED ORDER — MAGNESIUM SULFATE 2 GM/50ML IV SOLN
2.0000 g | Freq: Once | INTRAVENOUS | Status: AC
  Administered 2016-08-26: 2 g via INTRAVENOUS
  Filled 2016-08-26: qty 50

## 2016-08-26 MED ORDER — SERTRALINE HCL 25 MG PO TABS
25.0000 mg | ORAL_TABLET | Freq: Every day | ORAL | Status: DC
  Administered 2016-08-26 – 2016-08-30 (×3): 25 mg via ORAL
  Filled 2016-08-26 (×6): qty 1

## 2016-08-26 MED ORDER — AMLODIPINE BESYLATE 5 MG PO TABS
5.0000 mg | ORAL_TABLET | Freq: Every day | ORAL | Status: DC
  Administered 2016-08-26 – 2016-08-28 (×3): 5 mg via ORAL
  Filled 2016-08-26 (×3): qty 1

## 2016-08-26 NOTE — Progress Notes (Signed)
Upon assessment pt noted to have significant amount of hematuria in foley . It appears to be new onset . There deos'nt appear to be any trauma to the penis . Pt denies pain . md notified . Ordered stat cbc and inr with 15ml foley irrigation every 4 hrs .

## 2016-08-26 NOTE — Progress Notes (Signed)
PT Cancellation Note  Patient Details Name: Adrian DrownWilliam R Melendez MRN: [id number] DOB: 03/19/1935   Cancelled Treatment:    Reason Eval/Treat Not Completed: Fatigue/lethargy limiting ability to participate. [name] daughter reports pt is sleeping. Walking to/from the bathroom took all the energy he had. She is requesting PT eval be held until tomorrow.   [name], [name] 08/26/2016, 1:38 PM

## 2016-08-26 NOTE — Progress Notes (Signed)
Pt noted pulling at medical device , trying to get out of bed . Daughter at bedside . She states pt is restless and she wants medication to calm him so he could rest . Pt given medication for anxiety

## 2016-08-26 NOTE — Progress Notes (Signed)
PROGRESS NOTE    Adrian Melendez  ZOX:[id number] DOB: Oct 19, 1934 DOA: 27-Aug-2016 PCP: Garlan Fillers, MD    Brief Narrative: Adrian Melendez is a 81 y.o. male with a Past Medical History  migraines, high cholesterol, gout, CHF, CK D, atrial flutter and stroke who presents with altered mental status. Found to have urinary tact infection. Likely due to urinary retention. Patient has had long-standing history of symptoms of urinary retention.  Assessment & Plan:   Principal Problem:   Acute encephalopathy Active Problems:   HLD (hyperlipidemia)   Hypokalemia   Paroxysmal atrial fibrillation (HCC)   Essential hypertension   Generalized weakness   UTI (urinary tract infection)   Urinary retention   1-Acute encephalopathy;  Related to infection.  CT head no acute infarct.  RPR negative. Ammonia 26.  2-Urine retention, UTI Continue with IV antibiotics.  Continue with flomax.  Treat constipation.  Need voiding trial.  Renal US; No hydronephrosis. The bladder is completely decompressed by Foley catheter. Bilateral renal cysts measuring up to 13 cm on the left.   3-Hypokalemia; replete.   4-A fib; on xarelto.  Episode of bradycardia. Asymptomatic. Replete electrolytes.      DVT prophylaxis: on xarelto  Code Status: Full code.  Family Communication: daughter at bedside.  Disposition Plan: home  Consultants:   none   Procedures: none   Antimicrobials: ceftriaxone 12-09   Subjective: Sleepy, didn't sleep overnight.  He say hello there,   Objective: Vitals:   08-27-2016 1245 08/27/2016 1413 August 27, 2016 2321 08/26/16 0622  BP:  (!) 181/82 (!) 159/81 (!) 160/69  Pulse: 65 (!) 55 60 (!) 59  Resp: 19 18 17    Temp:  98.7 F (37.1 C) 98.1 F (36.7 C) 98 F (36.7 C)  TempSrc:  Oral Oral Oral  SpO2: 96% 96% 97% 95%  Weight:  93.3 kg (205 lb 9.6 oz)    Height:  6' (1.829 m)      Intake/Output Summary (Last 24 hours) at 08/26/16 0901 Last data filed at  08/26/16 8119  Gross per 24 hour  Intake              145 ml  Output             2850 ml  Net            -2705 ml   Filed Weights   08/27/16 1413  Weight: 93.3 kg (205 lb 9.6 oz)    Examination:  General exam: Appears calm and comfortable  Respiratory system: Clear to auscultation. Respiratory effort normal. Cardiovascular system: S1 & S2 heard, RRR. No JVD, murmurs, rubs, gallops or clicks. No pedal edema. Gastrointestinal system: Abdomen is nondistended, soft and nontender. No organomegaly or masses felt. Normal bowel sounds heard. Central nervous system: Alert and oriented. No focal neurological deficits. Extremities: Symmetric 5 x 5 power. Skin: No rashes, lesions or ulcers Psychiatry: Judgement and insight appear normal. Mood & affect appropriate.     Data Reviewed: I have personally reviewed following labs and imaging studies  CBC:  Recent Labs Lab 08/27/16 0930 08/26/16 0520  WBC 11.5* 9.6  NEUTROABS 9.5*  --   HGB 15.0 13.6  HCT 44.1 40.7  MCV 91.5 92.1  PLT 198 174   Basic Metabolic Panel:  Recent Labs Lab 08/27/2016 0931 08/26/16 0520  NA 140 142  K 2.8* 3.4*  CL 104 107  CO2 23 24  GLUCOSE 125* 107*  BUN 24* 18  CREATININE 1.31* 1.01  CALCIUM 9.0  8.7*  MG 1.7  --    GFR: Estimated Creatinine Clearance: 70.4 mL/min (by C-G formula based on SCr of 1.01 mg/dL). Liver Function Tests:  Recent Labs Lab 09/04/2016 0931  AST 31  ALT 27  ALKPHOS 114  BILITOT 1.1  PROT 6.7  ALBUMIN 4.1   No results for input(s): LIPASE, AMYLASE in the last 168 hours.  Recent Labs Lab 09/04/2016 1120  AMMONIA 26   Coagulation Profile:  Recent Labs Lab 09/04/2016 1120  INR 1.31   Cardiac Enzymes: No results for input(s): CKTOTAL, CKMB, CKMBINDEX, TROPONINI in the last 168 hours. BNP (last 3 results) No results for input(s): PROBNP in the last 8760 hours. HbA1C: No results for input(s): HGBA1C in the last 72 hours. CBG:  Recent Labs Lab 09/04/2016 0845    GLUCAP 141*   Lipid Profile: No results for input(s): CHOL, HDL, LDLCALC, TRIG, CHOLHDL, LDLDIRECT in the last 72 hours. Thyroid Function Tests: No results for input(s): TSH, T4TOTAL, FREET4, T3FREE, THYROIDAB in the last 72 hours. Anemia Panel:  Recent Labs  09/04/2016 1319  VITAMINB12 627   Sepsis Labs: No results for input(s): PROCALCITON, LATICACIDVEN in the last 168 hours.  No results found for this or any previous visit (from the past 240 hour(s)).       Radiology Studies: Dg Chest 2 View  Result Date: 09/01/2016 CLINICAL DATA:  Cough. EXAM: CHEST  2 VIEW COMPARISON:  02/29/2012 . FINDINGS: Mediastinum hilar structures normal. Mild bibasilar subsegmental atelectasis and/or scarring. No change from prior exam. Stable cardiomegaly. No pulmonary venous congestion. IMPRESSION: 1. Mild bibasilar subsegmental atelectasis and/or scarring again noted. No change. 2. Stable cardiomegaly.  No acute abnormality. Electronically Signed   By: Maisie Fushomas  Register   On: Jan 31, 2016 10:11   Ct Head Wo Contrast  Result Date: 09/17/2016 CLINICAL DATA:  Family states over the last two weeks the patient has become very lethargic and his legs very weak. Patient is normally able to ambulate with a walker but the family states they have had difficulty getting the patient to ambulate. EXAM: CT HEAD WITHOUT CONTRAST TECHNIQUE: Contiguous axial images were obtained from the base of the skull through the vertex without intravenous contrast. COMPARISON:  CT head 01/12/2016 FINDINGS: Brain: There is significant central and cortical atrophy. Periventricular white matter changes are extensive in appears stable. Chronic lacunar infarct of the left basal ganglia. There is no intra or extra-axial fluid collection or mass lesion. The basilar cisterns and ventricles have a normal appearance. There is no CT evidence for acute infarction or hemorrhage. Vascular: There is atherosclerotic calcification of the carotid siphons.  Skull: Normal. Negative for fracture or focal lesion. Sinuses/Orbits: Mild mucosal thickening noted within the paranasal sinuses. Orbits are unremarkable. Mastoid air cells are normally aerated. Other: None IMPRESSION: 1. Significant atrophy and small vessel disease, stable in appearance. 2. Chronic lacunar infarct of the left basal ganglia. 3.  No evidence for acute intracranial abnormality. 4. Mild mucosal thickening of the paranasal sinuses. Electronically Signed   By: Norva PavlovElizabeth  Brown M.D.   On: Jan 31, 2016 10:34   [place] Renal  Result Date: 09/09/2016 CLINICAL DATA:  Urinary retention EXAM: RENAL / URINARY TRACT ULTRASOUND COMPLETE COMPARISON:  None. FINDINGS: Right Kidney: Length: 15 cm. Renal enlargement secondary to cystic change. 2 simple appearing cysts are noted, up to 7 cm in the lower pole. No hydronephrosis. No solid mass. Left Kidney: Length: 17 cm. Enlargement is secondary to a 13 cm lower pole cyst. No hydronephrosis. No solid mass. Bladder:  Completely decompressed by Foley catheter. IMPRESSION: 1. No hydronephrosis. The bladder is completely decompressed by Foley catheter. 2. Bilateral renal cysts measuring up to 13 cm on the left. Electronically Signed   By: Marnee SpringJonathon  Watts M.D.   On: 10-11-2015 13:34        Scheduled Meds: . allopurinol  450 mg Oral Daily  . ALPRAZolam  0.25 mg Oral QHS  . amLODipine  5 mg Oral Daily  . aspirin EC  81 mg Oral Daily  . cefTRIAXone (ROCEPHIN)  IV  1 g Intravenous Q24H  . divalproex  500 mg Oral Daily  . gatifloxacin  1 drop Right Eye QID  . haloperidol lactate  2 mg Intravenous Once  . ketorolac  1 drop Right Eye QID  . magnesium sulfate 1 - 4 g bolus IVPB  2 g Intravenous Once  . potassium chloride  20 mEq Oral Once  . potassium chloride  40 mEq Oral Once  . prednisoLONE acetate  1 drop Right Eye QID  . rivaroxaban  20 mg Oral Q supper  . sertraline  25 mg Oral Daily  . tamsulosin  0.4 mg Oral Daily   Continuous Infusions:   LOS: 0 days      Time spent:35 minutes.     Alba Coryegalado, Kortni Hasten A, MD [facility] Pager [phone]  If 7PM-7AM, please contact night-coverage [email or web address] Password [email or web address] 08/26/2016, 9:01 AM

## 2016-08-26 NOTE — Progress Notes (Signed)
Received call CCMD about pt's heart rate in the 40's with a low of 38. MD notified. Will continue to monitor.

## 2016-08-27 LAB — BASIC METABOLIC PANEL
ANION GAP: 9 (ref 5–15)
BUN: 16 mg/dL (ref 6–20)
CHLORIDE: 103 mmol/L (ref 101–111)
CO2: 28 mmol/L (ref 22–32)
Calcium: 8.6 mg/dL — ABNORMAL LOW (ref 8.9–10.3)
Creatinine, Ser: 1.05 mg/dL (ref 0.61–1.24)
GFR calc non Af Amer: >60 mL/min (ref >60)
GLUCOSE: 99 mg/dL (ref 65–99)
Potassium: 3.4 mmol/L — ABNORMAL LOW (ref 3.5–5.1)
Sodium: 140 mmol/L (ref 135–145)

## 2016-08-27 LAB — CBC
HCT: 40.3 % (ref 39.0–52.0)
HEMOGLOBIN: 13.7 g/dL (ref 13.0–17.0)
MCH: 31.5 pg (ref 26.0–34.0)
MCHC: 34 g/dL (ref 30.0–36.0)
MCV: 92.6 fL (ref 78.0–100.0)
Platelets: 169 10*3/uL (ref 150–400)
RBC: 4.35 MIL/uL (ref 4.22–5.81)
RDW: 13.8 % (ref 11.5–15.5)
WBC: 9.2 10*3/uL (ref 4.0–10.5)

## 2016-08-27 LAB — PROTIME-INR
INR: 2.32
Prothrombin Time: 25.9 seconds — ABNORMAL HIGH (ref 11.4–15.2)

## 2016-08-27 MED ORDER — POLYETHYLENE GLYCOL 3350 17 G PO PACK
17.0000 g | PACK | Freq: Every day | ORAL | Status: DC
  Administered 2016-08-27 – 2016-08-28 (×2): 17 g via ORAL
  Filled 2016-08-27 (×2): qty 1

## 2016-08-27 MED ORDER — ALPRAZOLAM 0.25 MG PO TABS
0.2500 mg | ORAL_TABLET | Freq: Every evening | ORAL | Status: DC | PRN
  Filled 2016-08-27 (×2): qty 1

## 2016-08-27 MED ORDER — POTASSIUM CHLORIDE CRYS ER 20 MEQ PO TBCR
40.0000 meq | EXTENDED_RELEASE_TABLET | Freq: Once | ORAL | Status: AC
  Administered 2016-08-27: 40 meq via ORAL
  Filled 2016-08-27: qty 2

## 2016-08-27 NOTE — Progress Notes (Signed)
PROGRESS NOTE    Adrian Melendez  ZOX:[id number] DOB: 12-13-1934 DOA: 09-02-16 PCP: Garlan Fillers, MD    Brief Narrative: Adrian Melendez is a 81 y.o. male with a Past Medical History  migraines, high cholesterol, gout, CHF, CK D, atrial flutter and stroke who presents with altered mental status. Found to have urinary tact infection. Likely due to urinary retention. Patient has had long-standing history of symptoms of urinary retention.  Assessment & Plan:   Principal Problem:   Acute encephalopathy Active Problems:   HLD (hyperlipidemia)   Hypokalemia   Paroxysmal atrial fibrillation (HCC)   Essential hypertension   Generalized weakness   UTI (urinary tract infection)   Urinary retention   1-Acute encephalopathy;  Related to infection.  CT head no acute infarct.  RPR negative. Ammonia 26. Per family not at baseline yet.  Will try to keep awake during the day. Frequent orientation, activity vest   2-Urine retention, UTI Continue with IV antibiotics.  Continue with flomax.  Treat constipation. Had B<  Need voiding trial, hopefully 12-11 Renal US; No hydronephrosis. The bladder is completely decompressed by Foley catheter. Bilateral renal cysts measuring up to 13 cm on the left. urine culture with no growth   3-Hypokalemia; replete.   4-A fib; on xarelto.  Episode of bradycardia. Asymptomatic. Replete electrolytes.      DVT prophylaxis: on xarelto  Code Status: Full code.  Family Communication: daughter at bedside.  Disposition Plan: home  Consultants:   none   Procedures: none   Antimicrobials: ceftriaxone 12-09   Subjective: Patient is alert, he denies pain. He recognized daughter. Aware that he is hospital   Objective: Vitals:   08/26/16 0958 08/26/16 1605 08/26/16 2042 08/27/16 0534  BP: (!) 152/103 (!) 155/65 (!) 152/47 (!) 165/75  Pulse:  (!) 51 (!) 52 (!) 55  Resp:  (!) 8  18  Temp:   98.7 F (37.1 C) 98 F (36.7 C)  TempSrc:    Oral Oral  SpO2:   97% 93%  Weight:      Height:        Intake/Output Summary (Last 24 hours) at 08/27/16 1054 Last data filed at 08/27/16 0731  Gross per 24 hour  Intake               15 ml  Output             1600 ml  Net            -1585 ml   Filed Weights   09-02-2016 1413  Weight: 93.3 kg (205 lb 9.6 oz)    Examination:  General exam: Appears calm and comfortable  Respiratory system: Clear to auscultation. Respiratory effort normal. Cardiovascular system: S1 & S2 heard, RRR. No JVD, murmurs, rubs, gallops or clicks. No pedal edema. Gastrointestinal system: Abdomen is nondistended, soft and nontender. No organomegaly or masses felt. Normal bowel sounds heard. Central nervous system: Alert and oriented. No focal neurological deficits. Extremities: Symmetric 5 x 5 power. Skin: No rashes, lesions or ulcers Psychiatry: Judgement and insight appear normal. Mood & affect appropriate.     Data Reviewed: I have personally reviewed following labs and imaging studies  CBC:  Recent Labs Lab 09-02-16 0930 08/26/16 0520 08/27/16 0011  WBC 11.5* 9.6 9.2  NEUTROABS 9.5*  --   --   HGB 15.0 13.6 13.7  HCT 44.1 40.7 40.3  MCV 91.5 92.1 92.6  PLT 198 174 169   Basic Metabolic Panel:  Recent  Labs Lab 08/29/2016 0931 08/26/16 0520 08/27/16 0435  NA 140 142 140  K 2.8* 3.4* 3.4*  CL 104 107 103  CO2 23 24 28   GLUCOSE 125* 107* 99  BUN 24* 18 16  CREATININE 1.31* 1.01 1.05  CALCIUM 9.0 8.7* 8.6*  MG 1.7  --   --    GFR: Estimated Creatinine Clearance: 67.7 mL/min (by C-G formula based on SCr of 1.05 mg/dL). Liver Function Tests:  Recent Labs Lab 09/09/2016 0931  AST 31  ALT 27  ALKPHOS 114  BILITOT 1.1  PROT 6.7  ALBUMIN 4.1   No results for input(s): LIPASE, AMYLASE in the last 168 hours.  Recent Labs Lab 09/12/2016 1120  AMMONIA 26   Coagulation Profile:  Recent Labs Lab 09/06/2016 1120 08/27/16 0011  INR 1.31 2.32   Cardiac Enzymes: No results for  input(s): CKTOTAL, CKMB, CKMBINDEX, TROPONINI in the last 168 hours. BNP (last 3 results) No results for input(s): PROBNP in the last 8760 hours. HbA1C: No results for input(s): HGBA1C in the last 72 hours. CBG:  Recent Labs Lab 09/02/2016 0845  GLUCAP 141*   Lipid Profile: No results for input(s): CHOL, HDL, LDLCALC, TRIG, CHOLHDL, LDLDIRECT in the last 72 hours. Thyroid Function Tests: No results for input(s): TSH, T4TOTAL, FREET4, T3FREE, THYROIDAB in the last 72 hours. Anemia Panel:  Recent Labs  08/31/2016 1319  VITAMINB12 627   Sepsis Labs: No results for input(s): PROCALCITON, LATICACIDVEN in the last 168 hours.  Recent Results (from the past 240 hour(s))  Urine culture     Status: None   Collection Time: 09/09/2016  9:42 AM  Result Value Ref Range Status   Specimen Description URINE, RANDOM  Final   Special Requests NONE  Final   Culture NO GROWTH  Final   Report Status 08/26/2016 FINAL  Final         Radiology Studies: [facility] Renal  Result Date: 09/05/2016 CLINICAL DATA:  Urinary retention EXAM: RENAL / URINARY TRACT ULTRASOUND COMPLETE COMPARISON:  None. FINDINGS: Right Kidney: Length: 15 cm. Renal enlargement secondary to cystic change. 2 simple appearing cysts are noted, up to 7 cm in the lower pole. No hydronephrosis. No solid mass. Left Kidney: Length: 17 cm. Enlargement is secondary to a 13 cm lower pole cyst. No hydronephrosis. No solid mass. Bladder: Completely decompressed by Foley catheter. IMPRESSION: 1. No hydronephrosis. The bladder is completely decompressed by Foley catheter. 2. Bilateral renal cysts measuring up to 13 cm on the left. Electronically Signed   By: Marnee SpringJonathon  Watts M.D.   On: 09/05/2016 13:34        Scheduled Meds: . allopurinol  450 mg Oral Daily  . amLODipine  5 mg Oral QHS  . aspirin EC  81 mg Oral Daily  . cefTRIAXone (ROCEPHIN)  IV  1 g Intravenous Q24H  . divalproex  500 mg Oral Daily  . gatifloxacin  1 drop Right Eye QID  .  ketorolac  1 drop Right Eye QID  . polyethylene glycol  17 g Oral Daily  . potassium chloride  40 mEq Oral Once  . prednisoLONE acetate  1 drop Right Eye QID  . rivaroxaban  20 mg Oral Q supper  . sertraline  25 mg Oral QHS  . tamsulosin  0.4 mg Oral Daily   Continuous Infusions:   LOS: 1 day    Time spent:35 minutes.     Alba Coryegalado, Hayward Rylander A, MD [facility] Pager [phone]  If 7PM-7AM, please contact night-coverage [email or web address] Password  [email or web address] 08/27/2016, 10:54 AM

## 2016-08-27 NOTE — Progress Notes (Signed)
Stat lock applied - irrigated foley . No sign of blood clots noted

## 2016-08-27 NOTE — Evaluation (Signed)
Physical Therapy Evaluation Patient Details Name: Adrian DrownWilliam R Melendez MRN: [id number] DOB: 12/19/1934 Today's Date: 08/27/2016   History of Present Illness  81 y.o. male with a Past Medical History  migraines, high cholesterol, gout, CHF, CK D, atrial flutter and stroke who presents with altered mental status. Found to have urinary tact infection. Likely due to urinary retention. Patient has had long-standing history of symptoms of urinary retention.  Clinical Impression  Pt admitted with above diagnosis. Pt currently with functional limitations due to the deficits listed below (see PT Problem List). On eval, pt required min assist for all functional mobility. He fatigues quickly.  Pt will benefit from skilled PT to increase their independence and safety with mobility to allow discharge to the venue listed below.  Daughter present during eval and confirms family can provide needed level of assist at home.     Follow Up Recommendations Home health PT;Supervision/Assistance - 24 hour    Equipment Recommendations  Rolling walker with 5" wheels;3in1 (PT)    Recommendations for Other Services       Precautions / Restrictions Precautions Precautions: Fall      Mobility  Bed Mobility Overal bed mobility: Needs Assistance Bed Mobility: Supine to Sit     Supine to sit: Min assist     General bed mobility comments: +rail, increased time to complete  Transfers Overall transfer level: Needs assistance Equipment used: Rolling walker (2 wheeled) Transfers: Sit to/from [employer]GI CorporationStand;Stand Pivot Transfers Sit to Stand: Min assist Stand pivot transfers: Min assist       General transfer comment: verbal cues for hand placement and sequencing  Ambulation/Gait Ambulation/Gait assistance: Min assist Ambulation Distance (Feet): 20 Feet Assistive device: Rolling walker (2 wheeled) Gait Pattern/deviations: Step-through pattern;Decreased stride length Gait velocity: decreased Gait velocity  interpretation: Below normal speed for age/gender General Gait Details: unsteady, fatigues quickly  Stairs            Wheelchair Mobility    Modified Rankin (Stroke Patients Only)       Balance Overall balance assessment: Needs assistance Sitting-balance support: No upper extremity supported;Feet supported Sitting balance-Leahy Scale: Good     Standing balance support: Bilateral upper extremity supported;During functional activity Standing balance-Leahy Scale: Poor                               Pertinent Vitals/Pain Pain Assessment: No/denies pain    Home Living Family/patient expects to be discharged to:: Private residence Living Arrangements: Spouse/significant other Available Help at Discharge: Family;Available 24 hours/day Type of Home: House Home Access: Stairs to enter Entrance Stairs-Rails: Left;Right;Can reach both Entrance Stairs-Number of Steps: 4 Home Layout: One level Home Equipment: Walker - 2 wheels;Cane - single point;Wheelchair - manual;Shower seat      Prior Function Level of Independence: Independent               Hand Dominance   Dominant Hand: Right    Extremity/Trunk Assessment   Upper Extremity Assessment: Defer to OT evaluation           Lower Extremity Assessment: Generalized weakness      Cervical / Trunk Assessment: Normal  Communication   Communication: No difficulties  Cognition Arousal/Alertness: Awake/alert Behavior During Therapy: Flat affect Overall Cognitive Status: Impaired/Different from baseline Area of Impairment: Orientation;Memory;Safety/judgement;Problem solving;Following commands Orientation Level: Disoriented to;Place;Time;Situation   Memory: Decreased short-term memory;Decreased recall of precautions Following Commands: Follows one step commands with increased time;Follows multi-step commands inconsistently Safety/Judgement:  Decreased awareness of safety   Problem Solving: Slow  processing      General Comments      Exercises     Assessment/Plan    PT Assessment Patient needs continued PT services  PT Problem List Decreased strength;Decreased activity tolerance;Decreased balance;Decreased cognition;Decreased knowledge of use of DME;Decreased mobility;Decreased safety awareness          PT Treatment Interventions DME instruction;Gait training;Functional mobility training;Stair training;Balance training;Therapeutic exercise;Therapeutic activities;Patient/family education;Cognitive remediation    PT Goals (Current goals can be found in the Care Plan section)  Acute Rehab PT Goals Patient Stated Goal: home per daughter PT Goal Formulation: With patient/family Time For Goal Achievement: 09/10/16 Potential to Achieve Goals: Good    Frequency Min 3X/week   Barriers to discharge        Co-evaluation               End of Session Equipment Utilized During Treatment: Gait belt Activity Tolerance: Patient limited by fatigue Patient left: in chair;with chair alarm set;with call bell/phone within reach;with family/visitor present Nurse Communication: Mobility status         Time: [id number] PT Time Calculation (min) (ACUTE ONLY): 26 min   Charges:   PT Evaluation $PT Eval Moderate Complexity: 1 Procedure PT Treatments $Therapeutic Activity: 8-22 mins   PT G Codes:        [name], Devontre Siedschlag Rene 08/27/2016, 11:12 AM

## 2016-08-28 ENCOUNTER — Telehealth: Payer: Self-pay | Admitting: *Deleted

## 2016-08-28 LAB — BLOOD GAS, ARTERIAL
Acid-Base Excess: 0.9 mmol/L (ref 0.0–2.0)
BICARBONATE: 24 mmol/L (ref 20.0–28.0)
Drawn by: 275531
FIO2: 0.21
O2 Saturation: 94.2 %
PATIENT TEMPERATURE: 98.6
PCO2 ART: 32 mmHg (ref 32.0–48.0)
PH ART: 7.488 — AB (ref 7.350–7.450)
PO2 ART: 69.1 mmHg — AB (ref 83.0–108.0)

## 2016-08-28 LAB — GLUCOSE, CAPILLARY: GLUCOSE-CAPILLARY: 118 mg/dL — AB (ref 65–99)

## 2016-08-28 LAB — URINALYSIS, ROUTINE W REFLEX MICROSCOPIC
BILIRUBIN URINE: NEGATIVE
Glucose, UA: NEGATIVE mg/dL
Ketones, ur: 5 mg/dL — AB
Nitrite: NEGATIVE
PH: 5 (ref 5.0–8.0)
Protein, ur: 100 mg/dL — AB
SPECIFIC GRAVITY, URINE: 1.021 (ref 1.005–1.030)
SQUAMOUS EPITHELIAL / LPF: NONE SEEN

## 2016-08-28 LAB — FOLATE RBC
FOLATE, HEMOLYSATE: 317.5 ng/mL
FOLATE, RBC: 740 ng/mL (ref >498)
HEMATOCRIT: 42.9 % (ref 37.5–51.0)

## 2016-08-28 LAB — BASIC METABOLIC PANEL
ANION GAP: 11 (ref 5–15)
BUN: 19 mg/dL (ref 6–20)
CHLORIDE: 104 mmol/L (ref 101–111)
CO2: 25 mmol/L (ref 22–32)
Calcium: 8.8 mg/dL — ABNORMAL LOW (ref 8.9–10.3)
Creatinine, Ser: 1.1 mg/dL (ref 0.61–1.24)
GFR calc Af Amer: >60 mL/min (ref >60)
GLUCOSE: 108 mg/dL — AB (ref 65–99)
POTASSIUM: 3.6 mmol/L (ref 3.5–5.1)
Sodium: 140 mmol/L (ref 135–145)

## 2016-08-28 MED ORDER — SACCHAROMYCES BOULARDII 250 MG PO CAPS
250.0000 mg | ORAL_CAPSULE | Freq: Two times a day (BID) | ORAL | Status: DC
  Administered 2016-08-28 – 2016-08-30 (×2): 250 mg via ORAL
  Filled 2016-08-28 (×5): qty 1

## 2016-08-28 MED ORDER — LORAZEPAM 2 MG/ML IJ SOLN
0.5000 mg | Freq: Once | INTRAMUSCULAR | Status: AC | PRN
  Administered 2016-08-29: 0.5 mg via INTRAVENOUS
  Filled 2016-08-28: qty 1

## 2016-08-28 MED ORDER — HYDRALAZINE HCL 20 MG/ML IJ SOLN
5.0000 mg | Freq: Once | INTRAMUSCULAR | Status: AC
  Administered 2016-08-28: 5 mg via INTRAVENOUS
  Filled 2016-08-28: qty 1

## 2016-08-28 MED ORDER — HALOPERIDOL LACTATE 5 MG/ML IJ SOLN
1.0000 mg | Freq: Once | INTRAMUSCULAR | Status: AC
  Administered 2016-08-28: 1 mg via INTRAVENOUS
  Filled 2016-08-28: qty 1

## 2016-08-28 MED ORDER — POLYVINYL ALCOHOL 1.4 % OP SOLN
1.0000 [drp] | OPHTHALMIC | Status: DC | PRN
  Administered 2016-08-28 – 2016-08-30 (×2): 1 [drp] via OPHTHALMIC
  Filled 2016-08-28 (×3): qty 15

## 2016-08-28 NOTE — Consult Note (Signed)
NEURO HOSPITALIST CONSULT NOTE   Requestig physician: Dr. Sunnie Nielsen   Reason for Consult: encephalopathy   History obtained from:  Patient and Chart   HPI:                                                                                                                                          Adrian Melendez is an 81 y.o. male with medical history significant for stroke, A. Fib, diastolic dysfunction, hypertension, migraines, presents to the emergency department with the chief complaint of difficulty urinating and 2 week history of gradual generalized weakness intermittent confusion. Initial evaluation reveals urinary retention, hypokalemia. Granddaughter states since Thanksgiving patient having difficulty urinating. During that time he's gradually become lethargic and developed generalized weakness.Over several days prior to admission granddaughter reports intermittent episodes of "disorientation". No reports of any recent falls. She does report that he typically ambulates independently and has needed a cane the last couple days to admission and day of admission he was unable to ambulate at all.  On admission patient's labs included potassium 2.8, glucose 125, creatinine 1.3, urine culture showed no growth, MRI those obtained in April 2017 showed multiple infarcts including the right cerebellar,left pontine,  bilateral basal ganglia, CT head that was obtained on 09/10/2016 showed there was significant central and cortical atrophy. Periventricular white matter changes that are extensive and appear stable.  As far as home medications that could cause confusion and sedation patient is on Depakote ER 500 mg but he takes 1 tab daily, Zoloft 25 mg 1 tab daily  In talking with the family patient has been very alert and oriented person he's very active. This was a very sudden onset confusion which worsened fairly quickly. Patient was initially noted to be generally weak and sleep a  lot. Per granddaughter he would take 2 hour naps multiple times during the day and then sleeping hours during the day. He says he feels weak all over. They then noted that he was very slow to respond and confused which is why he is brought to the hospital. Currently he is not a very good historian and as above noted majority of the history was obtained from the daughter and granddaughter. Patient was taken off his Crestor prior to some of this happening thinking that the weakness might of been secondary to that however no other medication changes have been made.  Past Medical History:  Diagnosis Date  . Atrial flutter (HCC)    a. On xarelto  . CKD (chronic kidney disease), stage III   . Colon polyps    colonoscopy 2006  . Diastolic dysfunction   . Dizziness 02/29/2012  . Gout   . Gout   . Headache   . History of nuclear stress test 03/28/2012   lexiscan  myoview; negative for ischemia  . Hx of migraines   . Hx-TIA (transient ischemic attack) June 2008  . Hyperglycemia   . Hyperlipidemia    statin intolerance  . Hyperlipidemia   . Hypertension   . Hypokalemia   . Migraine aura without headache   . Migraines    classic ophthalmic  . Stroke (HCC)   . Syncope, near 02/29/2012   a. Resulting in discontinuation of beta blocker.  . Urinary retention     Past Surgical History:  Procedure Laterality Date  . COLONOSCOPY     2006,11/12  . EYE SURGERY     laser surgery to seal vein   . TRANSTHORACIC ECHOCARDIOGRAM  03/01/2012   EF 55-60% with normal systolic function; mild AV regurg; calcified mitral annulus - ordered for syncope    Family History  Problem Relation Age of Onset  . Cancer Maternal Grandmother   . Heart disease Maternal Grandfather   . Pneumonia Paternal Grandfather      Social History:  reports that he has never smoked. He has never used smokeless tobacco. He reports that he does not drink alcohol or use drugs.  Allergies  Allergen Reactions  . Statins      Cause leg weakness     MEDICATIONS:                                                                                                                     Prior to Admission:  Prescriptions Prior to Admission  Medication Sig Dispense Refill Last Dose  . allopurinol (ZYLOPRIM) 300 MG tablet Take 450 mg by mouth daily. Take one and a half tablets (450mg ) daily   08/24/2016 at Unknown time  . amLODipine (NORVASC) 5 MG tablet Take 1 tablet (5 mg total) by mouth daily. 90 tablet 3 08/24/2016 at Unknown time  . aspirin 81 MG tablet Take 81 mg by mouth daily.   08/24/2016 at Unknown time  . Cholecalciferol (VITAMIN D3) 1000 units CAPS Take 1,000 Units by mouth daily.   08/24/2016 at Unknown time  . Cyanocobalamin (VITAMIN B-12) 1000 MCG SUBL Place 1,000 mcg under the tongue daily.   08/24/2016 at Unknown time  . divalproex (DEPAKOTE ER) 500 MG 24 hr tablet TAKE 1 TABLET EVERY DAY 30 tablet 5 08/24/2016 at Unknown time  . sertraline (ZOLOFT) 25 MG tablet Take 25 mg by mouth daily.   08/24/2016 at Unknown time  . Ubiquinol 100 MG CAPS Take 200 mg by mouth daily.    08/24/2016 at Unknown time  . XARELTO 20 MG TABS tablet TAKE 1 TABLET BY MOUTH DAILY WITH SUPPER 30 tablet 0 08/23/2016 at 1800   Scheduled: . allopurinol  450 mg Oral Daily  . amLODipine  5 mg Oral QHS  . aspirin EC  81 mg Oral Daily  . cefTRIAXone (ROCEPHIN)  IV  1 g Intravenous Q24H  . divalproex  500 mg Oral Daily  . gatifloxacin  1 drop Right Eye QID  . ketorolac  1 drop  Right Eye QID  . polyethylene glycol  17 g Oral Daily  . prednisoLONE acetate  1 drop Right Eye QID  . rivaroxaban  20 mg Oral Q supper  . sertraline  25 mg Oral QHS  . tamsulosin  0.4 mg Oral Daily     ROS:                                                                                                                                       History obtained from the patient  General ROS: negative for - chills, fatigue, fever, night sweats, weight gain or weight  loss Psychological ROS: negative for - behavioral disorder, hallucinations, memory difficulties, mood swings or suicidal ideation Ophthalmic ROS: negative for - blurry vision, double vision, eye pain or loss of vision ENT ROS: negative for - epistaxis, nasal discharge, oral lesions, sore throat, tinnitus or vertigo Allergy and Immunology ROS: negative for - hives or itchy/watery eyes Hematological and Lymphatic ROS: negative for - bleeding problems, bruising or swollen lymph nodes Endocrine ROS: negative for - galactorrhea, hair pattern changes, polydipsia/polyuria or temperature intolerance Respiratory ROS: negative for - cough, hemoptysis, shortness of breath or wheezing Cardiovascular ROS: negative for - chest pain, dyspnea on exertion, edema or irregular heartbeat Gastrointestinal ROS: negative for - abdominal pain, diarrhea, hematemesis, nausea/vomiting or stool incontinence Genito-Urinary ROS: negative for - dysuria, hematuria, incontinence or urinary frequency/urgency Musculoskeletal ROS: negative for - joint swelling or muscular weakness Neurological ROS: as noted in HPI Dermatological ROS: negative for rash and skin lesion changes   Blood pressure 137/74, pulse (!) 53, temperature 97.8 F (36.6 C), temperature source Oral, resp. rate 17, height 6' (1.829 m), weight 93.3 kg (205 lb 9.6 oz), SpO2 96 %.   Neurologic Examination:                                                                                                      HEENT-  Normocephalic, no lesions, without obvious abnormality.  Normal external eye and conjunctiva.  Normal TM's bilaterally.  Normal auditory canals and external ears. Normal external nose, mucus membranes and septum.  Normal pharynx. Cardiovascular- irregularly irregular rhythm, pulses palpable throughout   Lungs- chest clear, no wheezing, rales, normal symmetric air entry Abdomen- normal findings: bowel sounds normal Extremities- no edema Lymph-no  adenopathy palpable Musculoskeletal-no joint tenderness, deformity or swelling Skin-warm and dry, no hyperpigmentation, vitiligo, or suspicious lesions  Neurological Examination Mental Status: Patient is very lethargic but will answer questions. When  he does answer questions his thought process is very slow and at times he perseverates on what he is saying or is said to him. He recently had surgery on his right eye so he can only open half of his right eye(medially) and his left eye he can open all the way however when talking to him he keeps both eyes closed. She is not alert to where he is he believes he is at his house, he believes it is [place], he is not aware of the city or the state. When asked how many quarters are in $2 he states two.  When asked to follow commands he will initially follow them but then when told to stop he will continue to do the command asked of him. And as stated above he is very slow to follow commands or answer questions Cranial Nerves: II:  Visual fields grossly normal, pupils equal, round, reactive to light and accommodation III,IV, VI: ptosis not present, extra-ocular motions intact bilaterally V,VII: smile symmetric, facial light touch sensation normal bilaterally VIII: hearing normal bilaterally IX,X: uvula rises symmetrically XI: bilateral shoulder shrug XII: midline tongue extension Motor: Right : Upper extremity   5/5    Left:     Upper extremity   5/5  Lower extremity   5/5     Lower extremity   5/5 I do not know any asterixis, there is a mild tremor in his left hand when held outstretched. Patient has difficulty relaxing but I did not note any increased tone throughout. Tone and bulk:normal tone throughout; no atrophy noted Sensory: Pinprick and light touch intact throughout, bilaterally Deep Tendon Reflexes: 2+ and symmetric throughout Plantars: Right: downgoing   Left: downgoing Cerebellar: normal finger-to-nose,  and normal heel-to-shin test Gait:  Not tested      Lab Results: Basic Metabolic Panel:  Recent Labs Lab 2016/09/16 0931 08/26/16 0520 08/27/16 0435 08/28/16 0617  NA 140 142 140 140  K 2.8* 3.4* 3.4* 3.6  CL 104 107 103 104  CO2 23 24 28 25   GLUCOSE 125* 107* 99 108*  BUN 24* 18 16 19   CREATININE 1.31* 1.01 1.05 1.10  CALCIUM 9.0 8.7* 8.6* 8.8*  MG 1.7  --   --   --     Liver Function Tests:  Recent Labs Lab Sep 16, 2016 0931  AST 31  ALT 27  ALKPHOS 114  BILITOT 1.1  PROT 6.7  ALBUMIN 4.1   No results for input(s): LIPASE, AMYLASE in the last 168 hours.  Recent Labs Lab 09/16/16 1120  AMMONIA 26    CBC:  Recent Labs Lab 09-16-16 0930 08/26/16 0520 08/27/16 0011  WBC 11.5* 9.6 9.2  NEUTROABS 9.5*  --   --   HGB 15.0 13.6 13.7  HCT 44.1 40.7 40.3  MCV 91.5 92.1 92.6  PLT 198 174 169    Cardiac Enzymes: No results for input(s): CKTOTAL, CKMB, CKMBINDEX, TROPONINI in the last 168 hours.  Lipid Panel: No results for input(s): CHOL, TRIG, HDL, CHOLHDL, VLDL, LDLCALC in the last 168 hours.  CBG:  Recent Labs Lab 2016/09/16 0845  GLUCAP 141*    Microbiology: Results for orders placed or performed during the hospital encounter of September 16, 2016  Urine culture     Status: None   Collection Time: 09-16-16  9:42 AM  Result Value Ref Range Status   Specimen Description URINE, RANDOM  Final   Special Requests NONE  Final   Culture NO GROWTH  Final   Report Status 08/26/2016 FINAL  Final    Coagulation Studies:  Recent Labs  08/27/16 0011  LABPROT 25.9*  INR 2.32    Imaging: No results found.      Assessment/Plan:  This is a 81 year old male with a fairly sudden onset-2 week-history of a generalized weakness which then became more of a confusion and generalized malaise. CT of head showed significant atrophy that has not changed. Labs have been unremarkable. Etiology at this time is unclear. Patient does have a flutter and is on Xarelto. Patient has remained afebrile with no  white blood cell count and exam does not show any meningismus. . At this time would recommend MRI brain to evaluate for possible shower of emboli.   I will discuss these findings with Dr. Amada JupiterKirkpatrick and Dr. Amada JupiterKirkpatrick will addend this note.  Assessment and plan per attending neurologist  Felicie Mornavid Smith PA-C [facility] [phone]  08/28/2016, 12:02 PM  I seen the patient and reviewed the above note. On my exam, the patient is much more awake, but does have deficits in attention. He is unable to spell world backwards, do serial sevens, or perform simple math.  His family describes a waxing and waning confusion ever since hospitalization consistent with a delirium.   No weakness that event preceding this could've been statin related and he was taken off this the day before his confusion started. I'm not familiar with withdrawal of the statin therapy causing any confusion.  At this time, I think treating this as delirium is most appropriate with continuing to minimize stimulation and night, keeping him awake and windows open during the day.  I'll check a Depakote level, though my suspicion is that this is not the culprit given the low-dose. Also, will check MRI and EEG.  Ritta SlotMcNeill Salsabeel Gorelick, MD [facility] [phone]  If 7pm- 7am, please page neurology on call as listed in [facility].

## 2016-08-28 NOTE — Telephone Encounter (Signed)
Rn call patients daughter Adrian Melendez about her father being in the hospital. Rn stated per the phone note she wanted Dr. Roda ShuttersXu to be on in his care. Rn stated the message was sent to him. Rn stated per Dr.Xu he cannot give any advice while he is working in the office. Rn stated per Dr. Roda ShuttersXu, the neuro hospital MD and neuro team should be consulted while he is hospitalized. Rn stated Dr. Roda ShuttersXu will be in the hospital next week. Pts daughter verbalized understanding.

## 2016-08-28 NOTE — Progress Notes (Signed)
Physical Therapy Treatment Patient Details Name: Adrian DrownWilliam R Melendez MRN: [id number] DOB: 01/20/1935 Today's Date: 08/28/2016    History of Present Illness 81 y.o. male with a Past Medical History  migraines, high cholesterol, gout, CHF, CK D, atrial flutter and stroke who presents with altered mental status. Found to have urinary tact infection. Likely due to urinary retention. Patient has had long-standing history of symptoms of urinary retention.    PT Comments    Pt remains lethargic and fatigues easily during session.  Pt is not safe during ambulation and requires assist to maintain safety.    Follow Up Recommendations  Home health PT;Supervision/Assistance - 24 hour     Equipment Recommendations  Rolling walker with 5" wheels;3in1 (PT)    Recommendations for Other Services       Precautions / Restrictions Precautions Precautions: Fall Restrictions Weight Bearing Restrictions: No    Mobility  Bed Mobility Overal bed mobility: Needs Assistance Bed Mobility: Supine to Sit     Supine to sit: Min guard     General bed mobility comments: +rail, increased time to complete  Transfers Overall transfer level: Needs assistance Equipment used: Rolling walker (2 wheeled) Transfers: Sit to/from [employer]GI CorporationStand;Stand Pivot Transfers Sit to Stand: Min assist;Mod assist (min assist for sit to stand and mod assist after "sleeping" episode to return to chair.  ) Stand pivot transfers: Min assist       General transfer comment: verbal cues for hand placement and sequencing.  Pt required increased assist x1 to flex hips and return to seated position.  On second trial patient able to follow commands for hand placement and returned to seated position without increased assist for hip flexion  Ambulation/Gait Ambulation/Gait assistance: Min assist Ambulation Distance (Feet): 15 Feet (x2) Assistive device: Rolling walker (2 wheeled) Gait Pattern/deviations: Step-through pattern;Decreased  stride length Gait velocity: decreased   General Gait Details: unsteady, fatigues quickly   Stairs            Wheelchair Mobility    Modified Rankin (Stroke Patients Only)       Balance Overall balance assessment: Needs assistance Sitting-balance support: Feet supported;Bilateral upper extremity supported Sitting balance-Leahy Scale: Good     Standing balance support: Bilateral upper extremity supported;During functional activity Standing balance-Leahy Scale: Poor                      Cognition Arousal/Alertness: Awake/alert Behavior During Therapy: Flat affect (lethargic with intermittent moments falling asleep during treatment.  ) Overall Cognitive Status: Impaired/Different from baseline Area of Impairment: Orientation;Memory;Safety/judgement;Problem solving;Following commands Orientation Level: Disoriented to;Time;Situation   Memory: Decreased short-term memory;Decreased recall of precautions Following Commands: Follows one step commands with increased time;Follows multi-step commands inconsistently Safety/Judgement: Decreased awareness of safety;Decreased awareness of deficits   Problem Solving: Slow processing General Comments: Pt with episode of closing eyes in standing during gait and requiring assist to return to chair.  Pt denies dizziness and vitals stable.      Exercises      General Comments        Pertinent Vitals/Pain Pain Assessment: No/denies pain    Home Living                      Prior Function            PT Goals (current goals can now be found in the care plan section) Acute Rehab PT Goals Patient Stated Goal: home per daughter Potential to Achieve Goals: Good Progress towards  PT goals: Progressing toward goals    Frequency    Min 3X/week      PT Plan Current plan remains appropriate    Co-evaluation             End of Session Equipment Utilized During Treatment: Gait belt Activity Tolerance:  Patient limited by fatigue Patient left: with call bell/phone within reach;with family/visitor present;in bed;with bed alarm set     Time: 1353-1416 PT Time Calculation (min) (ACUTE ONLY): 23 min  Charges:  $Gait Training: 8-22 mins $Therapeutic Activity: 8-22 mins                    G Codes:      Florestine Aversimee J Shikira Folino 08/28/2016, 2:26 PM Joycelyn RuaAimee Ether Wolters, PTA pager [phone]

## 2016-08-28 NOTE — Telephone Encounter (Signed)
Received call from patient's daughter, [name] who requested Katrina RN for Dr Roda ShuttersXu call her. She stated her father is in the hospital for some of the same issues she spoke to Dr Roda ShuttersXu about last week. She stated she wants Dr Roda ShuttersXu to be aware of patient's hospitalization and "be in on his care". Advised her that the neuro hospitalist and/or Dr Pearlean BrownieSethi will be called for consultation at the hospital if necessary. She verbalized understanding, but still requested Katrina call her; call routed.

## 2016-08-28 NOTE — Progress Notes (Signed)
PROGRESS NOTE    Adrian Melendez  WGN:[id number] DOB: 11/29/1934 DOA: 08/27/2016 PCP: Garlan FillersPATERSON,DANIEL G, MD    Brief Narrative: Adrian Melendez is a 81 y.o. male with a Past Medical History  migraines, high cholesterol, gout, CHF, CK D, atrial flutter and stroke who presents with altered mental status. Found to have urinary tact infection. Likely due to urinary retention. Patient has had long-standing history of symptoms of urinary retention.  Assessment & Plan:   Principal Problem:   Acute encephalopathy Active Problems:   HLD (hyperlipidemia)   Hypokalemia   Paroxysmal atrial fibrillation (HCC)   Essential hypertension   Generalized weakness   UTI (urinary tract infection)   Urinary retention   1-Acute encephalopathy; Delirium Related to infection.  CT head no acute infarct.  RPR negative. Ammonia 26.m, B 12 normal.  Will try to keep awake during the day. Frequent orientation, activity vest  Daughter concern that patient is still not back at baseline, he continue to sleep most of the day.  Neurology consulted. Should we continue with Depakote ? Do we need to get MRI.  ABG with normal CO2,   2-Urine retention, UTI Continue with IV antibiotics.  Continue with flomax.  Treat constipation. Had B<  Need voiding trial, hopefully 12-12 Renal US; No hydronephrosis. The bladder is completely decompressed by Foley catheter. Bilateral renal cysts measuring up to 13 cm on the left. urine culture with no growth   3-Hypokalemia; replete.   4-A fib; on xarelto.  Episode of bradycardia. Asymptomatic. Replete electrolytes.   5-HTN; continue with Norvasc. Per daughter patient has history of orthostatic hypotension and he is only now on one BP med. Also his neurologist was recommending SBP goal of 130--160.    DVT prophylaxis: on xarelto  Code Status: Full code.  Family Communication: daughter at bedside.  Disposition Plan: home  Consultants:   none   Procedures:  none   Antimicrobials: ceftriaxone 12-09   Subjective: Patient is alert, fall sleep in between conversation.  He was able to tell me he is in the hospital. He denies pain. He is able to follows command.    Objective: Vitals:   08/27/16 1355 08/27/16 2018 08/28/16 0526 08/28/16 0800  BP: (!) 145/62 (!) 176/75 (!) 182/81 137/74  Pulse: (!) 53 (!) 58 (!) 53   Resp: 15 18 17    Temp: 98.8 F (37.1 C) 97.3 F (36.3 C) 97.8 F (36.6 C)   TempSrc:  Oral Oral   SpO2: 97% 97% 96%   Weight:      Height:        Intake/Output Summary (Last 24 hours) at 08/28/16 1238 Last data filed at 08/28/16 1041  Gross per 24 hour  Intake              640 ml  Output             1450 ml  Net             -810 ml   Filed Weights   09/11/2016 1413  Weight: 93.3 kg (205 lb 9.6 oz)    Examination:  General exam: Appears calm and comfortable  Respiratory system: Clear to auscultation. Respiratory effort normal. Cardiovascular system: S1 & S2 heard, RRR. No JVD, murmurs, rubs, gallops or clicks. No pedal edema. Gastrointestinal system: Abdomen is nondistended, soft and nontender. No organomegaly or masses felt. Normal bowel sounds heard. Central nervous system: oriented to placed. No focal neurological deficits. Extremities: Symmetric 5 x 5 power. Skin:  No rashes, lesions or ulcers     Data Reviewed: I have personally reviewed following labs and imaging studies  CBC:  Recent Labs Lab 08/24/2016 0930 08/26/16 0520 08/27/16 0011  WBC 11.5* 9.6 9.2  NEUTROABS 9.5*  --   --   HGB 15.0 13.6 13.7  HCT 44.1 40.7 40.3  MCV 91.5 92.1 92.6  PLT 198 174 169   Basic Metabolic Panel:  Recent Labs Lab 08/24/2016 0931 08/26/16 0520 08/27/16 0435 08/28/16 0617  NA 140 142 140 140  K 2.8* 3.4* 3.4* 3.6  CL 104 107 103 104  CO2 23 24 28 25   GLUCOSE 125* 107* 99 108*  BUN 24* 18 16 19   CREATININE 1.31* 1.01 1.05 1.10  CALCIUM 9.0 8.7* 8.6* 8.8*  MG 1.7  --   --   --    GFR: Estimated  Creatinine Clearance: 64.6 mL/min (by C-G formula based on SCr of 1.1 mg/dL). Liver Function Tests:  Recent Labs Lab 08/31/2016 0931  AST 31  ALT 27  ALKPHOS 114  BILITOT 1.1  PROT 6.7  ALBUMIN 4.1   No results for input(s): LIPASE, AMYLASE in the last 168 hours.  Recent Labs Lab 09/02/2016 1120  AMMONIA 26   Coagulation Profile:  Recent Labs Lab 09/10/2016 1120 08/27/16 0011  INR 1.31 2.32   Cardiac Enzymes: No results for input(s): CKTOTAL, CKMB, CKMBINDEX, TROPONINI in the last 168 hours. BNP (last 3 results) No results for input(s): PROBNP in the last 8760 hours. HbA1C: No results for input(s): HGBA1C in the last 72 hours. CBG:  Recent Labs Lab 08/28/2016 0845  GLUCAP 141*   Lipid Profile: No results for input(s): CHOL, HDL, LDLCALC, TRIG, CHOLHDL, LDLDIRECT in the last 72 hours. Thyroid Function Tests: No results for input(s): TSH, T4TOTAL, FREET4, T3FREE, THYROIDAB in the last 72 hours. Anemia Panel:  Recent Labs  09/10/2016 1319  VITAMINB12 627   Sepsis Labs: No results for input(s): PROCALCITON, LATICACIDVEN in the last 168 hours.  Recent Results (from the past 240 hour(s))  Urine culture     Status: None   Collection Time: 08/20/2016  9:42 AM  Result Value Ref Range Status   Specimen Description URINE, RANDOM  Final   Special Requests NONE  Final   Culture NO GROWTH  Final   Report Status 08/26/2016 FINAL  Final         Radiology Studies: No results found.      Scheduled Meds: . allopurinol  450 mg Oral Daily  . amLODipine  5 mg Oral QHS  . aspirin EC  81 mg Oral Daily  . cefTRIAXone (ROCEPHIN)  IV  1 g Intravenous Q24H  . divalproex  500 mg Oral Daily  . gatifloxacin  1 drop Right Eye QID  . ketorolac  1 drop Right Eye QID  . polyethylene glycol  17 g Oral Daily  . prednisoLONE acetate  1 drop Right Eye QID  . rivaroxaban  20 mg Oral Q supper  . sertraline  25 mg Oral QHS  . tamsulosin  0.4 mg Oral Daily   Continuous  Infusions:   LOS: 2 days    Time spent:35 minutes.     Alba Coryegalado, Talaya Lamprecht A, MD [facility] Pager [phone]  If 7PM-7AM, please contact night-coverage [email or web address] Password [email or web address] 08/28/2016, 12:38 PM

## 2016-08-28 NOTE — Progress Notes (Signed)
Patient is refusing his eye drops. His family said that he finished treatment recommended by eye doctor and he doesn't need eye drops for now. Will continue to monitor.

## 2016-08-28 NOTE — Care Management Note (Addendum)
Case Management Note  Patient Details  Name: Adrian DrownWilliam R Bremer MRN: [id number] Date of Birth: 01/08/1935  Subjective/Objective:                 Patient admitted from home with wife. Has wlaker and WC, UTI IV Abx. Per bedside RN, patient more drowsy than yesterday, PT to eval today.    Action/Plan:  Anticipate HH, wife requesting new RW.   Expected Discharge Date:                  Expected Discharge Plan:  Home w Home Health Services  In-House Referral:     Discharge planning Services  CM Consult  Post Acute Care Choice:    Choice offered to:     DME Arranged:    DME Agency:     HH Arranged:    HH Agency:     Status of Service:  In process, will continue to follow  If discussed at Long Length of Stay Meetings, dates discussed:    Additional Comments:  Lawerance SabalDebbie Jaylinn Hellenbrand, RN 08/28/2016, 2:10 PM

## 2016-08-29 ENCOUNTER — Other Ambulatory Visit: Payer: Self-pay

## 2016-08-29 ENCOUNTER — Inpatient Hospital Stay (HOSPITAL_COMMUNITY): Payer: Medicare Other

## 2016-08-29 ENCOUNTER — Encounter (HOSPITAL_COMMUNITY): Payer: Self-pay

## 2016-08-29 DIAGNOSIS — I619 Nontraumatic intracerebral hemorrhage, unspecified: Secondary | ICD-10-CM | POA: Insufficient documentation

## 2016-08-29 DIAGNOSIS — I6330 Cerebral infarction due to thrombosis of unspecified cerebral artery: Secondary | ICD-10-CM | POA: Insufficient documentation

## 2016-08-29 DIAGNOSIS — I6789 Other cerebrovascular disease: Secondary | ICD-10-CM

## 2016-08-29 DIAGNOSIS — I609 Nontraumatic subarachnoid hemorrhage, unspecified: Secondary | ICD-10-CM

## 2016-08-29 DIAGNOSIS — L8990 Pressure ulcer of unspecified site, unspecified stage: Secondary | ICD-10-CM | POA: Insufficient documentation

## 2016-08-29 DIAGNOSIS — I6340 Cerebral infarction due to embolism of unspecified cerebral artery: Secondary | ICD-10-CM | POA: Insufficient documentation

## 2016-08-29 LAB — BASIC METABOLIC PANEL
Anion gap: 13 (ref 5–15)
BUN: 22 mg/dL — ABNORMAL HIGH (ref 6–20)
CHLORIDE: 102 mmol/L (ref 101–111)
CO2: 24 mmol/L (ref 22–32)
Calcium: 8.9 mg/dL (ref 8.9–10.3)
Creatinine, Ser: 1.09 mg/dL (ref 0.61–1.24)
GFR calc non Af Amer: >60 mL/min (ref >60)
Glucose, Bld: 115 mg/dL — ABNORMAL HIGH (ref 65–99)
POTASSIUM: 3.3 mmol/L — AB (ref 3.5–5.1)
SODIUM: 139 mmol/L (ref 135–145)

## 2016-08-29 LAB — CBC
HEMATOCRIT: 42.6 % (ref 39.0–52.0)
Hemoglobin: 14.6 g/dL (ref 13.0–17.0)
MCH: 31.3 pg (ref 26.0–34.0)
MCHC: 34.3 g/dL (ref 30.0–36.0)
MCV: 91.4 fL (ref 78.0–100.0)
Platelets: 199 10*3/uL (ref 150–400)
RBC: 4.66 MIL/uL (ref 4.22–5.81)
RDW: 13.7 % (ref 11.5–15.5)
WBC: 8.4 10*3/uL (ref 4.0–10.5)

## 2016-08-29 LAB — TROPONIN I
TROPONIN I: 0.03 ng/mL — AB (ref <0.03)
Troponin I: 0.03 ng/mL (ref <0.03)

## 2016-08-29 LAB — MRSA PCR SCREENING: MRSA BY PCR: NEGATIVE

## 2016-08-29 MED ORDER — POTASSIUM CHLORIDE 2 MEQ/ML IV SOLN
30.0000 meq | Freq: Once | INTRAVENOUS | Status: AC
  Administered 2016-08-29: 30 meq via INTRAVENOUS
  Filled 2016-08-29: qty 15

## 2016-08-29 MED ORDER — PNEUMOCOCCAL VAC POLYVALENT 25 MCG/0.5ML IJ INJ
0.5000 mL | INJECTION | INTRAMUSCULAR | Status: AC
  Administered 2016-09-02: 0.5 mL via INTRAMUSCULAR
  Filled 2016-08-29 (×2): qty 0.5

## 2016-08-29 MED ORDER — HYDRALAZINE HCL 20 MG/ML IJ SOLN
5.0000 mg | INTRAMUSCULAR | Status: DC | PRN
  Administered 2016-08-29 – 2016-08-30 (×3): 5 mg via INTRAVENOUS
  Filled 2016-08-29 (×4): qty 1

## 2016-08-29 MED ORDER — ORAL CARE MOUTH RINSE
15.0000 mL | Freq: Two times a day (BID) | OROMUCOSAL | Status: DC
  Administered 2016-08-30 – 2016-08-31 (×3): 15 mL via OROMUCOSAL

## 2016-08-29 MED ORDER — RESOURCE THICKENUP CLEAR PO POWD
ORAL | Status: DC | PRN
  Filled 2016-08-29: qty 125

## 2016-08-29 MED ORDER — HALOPERIDOL LACTATE 5 MG/ML IJ SOLN: 1.0000 mg | Freq: Once | INTRAMUSCULAR | Status: DC

## 2016-08-29 MED ORDER — HYDRALAZINE HCL 20 MG/ML IJ SOLN
5.0000 mg | Freq: Once | INTRAMUSCULAR | Status: AC
  Administered 2016-08-29: 5 mg via INTRAVENOUS
  Filled 2016-08-29: qty 1

## 2016-08-29 NOTE — Progress Notes (Signed)
MRI completed, revealing several small foci of diffusion restriction within pons, bilateral occipital lobes, scattered in right posterior temporal lobe, and in the right thalamus compatible with early subacute infarction. All of the infarcts are in a posterior circulation distribution; likely from embolus originating somewhere inferior to the basilar artery.   MRA of head and neck has been ordered. TTE has also been ordered.   Electronically signed: Dr. Caryl PinaEric Annaleigh Steinmeyer

## 2016-08-29 NOTE — Progress Notes (Signed)
PROGRESS NOTE    Adrian DrownWilliam R Garson  ZOX:[id number] DOB: 04/01/1935 DOA: 08/19/2016 PCP: Garlan FillersPATERSON,DANIEL G, MD    Brief Narrative: Adrian Melendez is a 81 y.o. male with a Past Medical History  migraines, high cholesterol, gout, CHF, CKD, atrial flutter and stroke who presents with altered mental status. Found to have urinary tact infection. Likely due to urinary retention. Patient has had long-standing history of symptoms of urinary retention.  Patient was stared on IV antibiotics,  He didn't improved with treatment of infection. Neurologist consulted and MRI was ordered. MRI consistent with multiple stroke. Patient had an episode of unresponsiveness when he was getting out of the bed to bedside commode. His eyes role back, and he was not responsive per daughter.     Assessment & Plan:   Principal Problem:   Acute encephalopathy Active Problems:   HLD (hyperlipidemia)   Hypokalemia   Paroxysmal atrial fibrillation (HCC)   Essential hypertension   Generalized weakness   UTI (urinary tract infection)   Urinary retention   Cerebral thrombosis with cerebral infarction   Cerebral embolism with cerebral infarction   Subarachnoid hemorrhage   Intracerebral hemorrhage   1-Acute - sub acute stroke;  Speech evaluation.  MRA ordered.  ECHO ordered.  Needs PT when stable . Continue with Xarelto, follow neurology recommendation regarding medications for secondary stroke prevention.   Syncope;  Patient had unresponsive episode.  Telemetry with no arrhythmia other than known A fib. HR did increase from 60 to 90.  Check echo, ekg, cardiac enzymes. EEG Will transfer patient to step down unit   Acute encephalopathy; Delirium; related to stroke ?  CT head no acute infarct.  RPR negative. Ammonia 26.m, B 12 normal.  Will try to keep awake during the day. Frequent orientation, activity vest  Neurology consulted. MRI positive for stroke.  ABG with normal CO2,   2-Urine retention, ?  UTI Received 4 days of IV antibiotics.  Continue with flomax.  Renal US; No hydronephrosis. The bladder is completely decompressed by Foley catheter. Bilateral renal cysts measuring up to 13 cm on the left. urine culture with no growth. Will stop antibiotics.   3-Hypokalemia; replete IV .   4-A fib; on xarelto.  Episode of bradycardia. Asymptomatic. Replete electrolytes.   5-HTN; Hold Norvasc, due to syncope.. Per daughter patient has history of orthostatic hypotension and he is only now on one BP med. Also his neurologist was recommending SBP goal of 130--160.    DVT prophylaxis: on xarelto  Code Status: Full code.  Family Communication: daughter at bedside.  Disposition Plan: home  Consultants:   none   Procedures: none   Antimicrobials: ceftriaxone 12-09   Subjective: Patient seen and examined. He is able to answer questions. Keeps eyes close.  Open eyes when ask. Follows some commands.    Objective: Vitals:   08/28/16 2203 08/29/16 0537 08/29/16 0817 08/29/16 0920  BP: (!) 147/66 (!) 190/72 (!) 158/80 129/71  Pulse: 74 63 65 90  Resp: 17 17 17 20   Temp:  97.3 F (36.3 C)  98.8 F (37.1 C)  TempSrc:  Oral  Oral  SpO2: 94% 98% 98% 97%  Weight:      Height:        Intake/Output Summary (Last 24 hours) at 08/29/16 0950 Last data filed at 08/29/16 0549  Gross per 24 hour  Intake              410 ml  Output  400 ml  Net               10 ml   Filed Weights   01-31-2016 1413  Weight: 93.3 kg (205 lb 9.6 oz)    Examination:  General exam: Appears calm and comfortable  Respiratory system: Clear to auscultation. Respiratory effort normal. Cardiovascular system: S1 & S2 heard, RRR. No JVD, murmurs, rubs, gallops or clicks. No pedal edema. Gastrointestinal system: Abdomen is nondistended, soft and nontender. No organomegaly or masses felt. Normal bowel sounds heard. Central nervous system:  No focal neurological deficits confused. . Extremities:  Symmetric 5 x 5 power. Skin: No rashes, lesions or ulcers     Data Reviewed: I have personally reviewed following labs and imaging studies  CBC:  Recent Labs Lab 01-31-2016 0930 01-31-2016 1242 08/26/16 0520 08/27/16 0011 08/29/16 0543  WBC 11.5*  --  9.6 9.2 8.4  NEUTROABS 9.5*  --   --   --   --   HGB 15.0  --  13.6 13.7 14.6  HCT 44.1 42.9 40.7 40.3 42.6  MCV 91.5  --  92.1 92.6 91.4  PLT 198  --  174 169 199   Basic Metabolic Panel:  Recent Labs Lab 01-31-2016 0931 08/26/16 0520 08/27/16 0435 08/28/16 0617 08/29/16 0543  NA 140 142 140 140 139  K 2.8* 3.4* 3.4* 3.6 3.3*  CL 104 107 103 104 102  CO2 23 24 28 25 24   GLUCOSE 125* 107* 99 108* 115*  BUN 24* 18 16 19  22*  CREATININE 1.31* 1.01 1.05 1.10 1.09  CALCIUM 9.0 8.7* 8.6* 8.8* 8.9  MG 1.7  --   --   --   --    GFR: Estimated Creatinine Clearance: 65.2 mL/min (by C-G formula based on SCr of 1.09 mg/dL). Liver Function Tests:  Recent Labs Lab 01-31-2016 0931  AST 31  ALT 27  ALKPHOS 114  BILITOT 1.1  PROT 6.7  ALBUMIN 4.1   No results for input(s): LIPASE, AMYLASE in the last 168 hours.  Recent Labs Lab 01-31-2016 1120  AMMONIA 26   Coagulation Profile:  Recent Labs Lab 01-31-2016 1120 08/27/16 0011  INR 1.31 2.32   Cardiac Enzymes: No results for input(s): CKTOTAL, CKMB, CKMBINDEX, TROPONINI in the last 168 hours. BNP (last 3 results) No results for input(s): PROBNP in the last 8760 hours. HbA1C: No results for input(s): HGBA1C in the last 72 hours. CBG:  Recent Labs Lab 01-31-2016 0845 08/28/16 1525  GLUCAP 141* 118*   Lipid Profile: No results for input(s): CHOL, HDL, LDLCALC, TRIG, CHOLHDL, LDLDIRECT in the last 72 hours. Thyroid Function Tests: No results for input(s): TSH, T4TOTAL, FREET4, T3FREE, THYROIDAB in the last 72 hours. Anemia Panel: No results for input(s): VITAMINB12, FOLATE, FERRITIN, TIBC, IRON, RETICCTPCT in the last 72 hours. Sepsis Labs: No results for  input(s): PROCALCITON, LATICACIDVEN in the last 168 hours.  Recent Results (from the past 240 hour(s))  Urine culture     Status: None   Collection Time: 01-31-2016  9:42 AM  Result Value Ref Range Status   Specimen Description URINE, RANDOM  Final   Special Requests NONE  Final   Culture NO GROWTH  Final   Report Status 08/26/2016 FINAL  Final         Radiology Studies: [name]  Result Date: 08/29/2016 CLINICAL DATA:  81 y/o M; gradual generalized weakness and intermittent confusion. EXAM: MRI HEAD WITHOUT CONTRAST TECHNIQUE: Multiplanar, multiecho pulse sequences of the brain  and surrounding structures were obtained without intravenous contrast. COMPARISON:  08-31-16 CT head.  01/12/2016 MRI head. FINDINGS: Brain: Several small foci of diffusion restriction within the pons, bilateral occipital lobes, scattered in right posterior temporal lobe, and in the right thalamus compatible with acute/ early subacute infarction. The largest focus of infarction is in the right paramedian pons. Tiny lacunar infarcts within the bilateral cerebellar hemispheres. Moderate chronic microvascular ischemic changes and parenchymal volume loss. Punctate foci of susceptibility hypointensity within right frontal lobe, left thalamus, right insula, and right occipital lobe are compatible with hemosiderin deposition from old microhemorrhage. The Vascular: Increased signal within the right vertebral artery flow void may be related to atherosclerosis or chronic occlusion and is unchanged. Flow voids are otherwise unremarkable. Skull and upper cervical spine: Normal marrow signal. Sinuses/Orbits: Small right maxillary sinus mucous retention cysts. Otherwise no abnormal signal of the paranasal sinuses or mastoid air cells. Bilateral intra-ocular lens replacement. Other: None. IMPRESSION: Several small foci of diffusion restriction within pons, bilateral occipital lobes, scattered in right posterior temporal  lobe, and in the right thalamus compatible with acute/ early subacute infarction. No significant mass effect. No associated hemorrhage. These results will be called to the ordering clinician or representative by the Radiologist Assistant, and communication documented in the PACS or zVision Dashboard. Electronically Signed   By: Mitzi Hansen M.D.   On: 08/29/2016 03:48        Scheduled Meds: . allopurinol  450 mg Oral Daily  . amLODipine  5 mg Oral QHS  . aspirin EC  81 mg Oral Daily  . cefTRIAXone (ROCEPHIN)  IV  1 g Intravenous Q24H  . divalproex  500 mg Oral Daily  . polyethylene glycol  17 g Oral Daily  . potassium chloride (KCL MULTIRUN) 30 mEq in 265 mL IVPB  30 mEq Intravenous Once  . rivaroxaban  20 mg Oral Q supper  . saccharomyces boulardii  250 mg Oral BID  . sertraline  25 mg Oral QHS  . tamsulosin  0.4 mg Oral Daily   Continuous Infusions:   LOS: 3 days    Time spent:35 minutes.     Alba Cory, MD [facility] Pager [phone]  If 7PM-7AM, please contact night-coverage [email or web address] Password [email or web address] 08/29/2016, 9:50 AM

## 2016-08-29 NOTE — Progress Notes (Signed)
Stroke Team Progress Note  SUBJECTIVE Patient's daughter  and granddaughter is at the bedside. A shunt head presented in April 2017 with the cart embolic stroke from A. fib on Xarelto. Aspirin was added at that time by Dr. Roda ShuttersXu. Now he presents with recurrent bilateral strokes despite good compliance with his medications. He presented with confusion and some leg weakness which seems to be improving.  OBJECTIVE Most recent Vital Signs: Temp: 97.9 F (36.6 C) (12/12 1650) Temp Source: Oral (12/12 1650) BP: 164/94 (12/12 1650) Pulse Rate: 59 (12/12 1650) Respiratory Rate: 15 O2 Saturdation: 100%  CBG (last 3)   Recent Labs  08/28/16 1525  GLUCAP 118*    Diet: DIET DYS 3 Room service appropriate? Yes; Fluid consistency: Nectar Thick  liquids  Activity: Up with assistance   VTE Prophylaxis:   xarelto  Studies: Results for orders placed or performed during the hospital encounter of 09/17/2016 (from the past 24 hour(s))  CBC     Status: None   Collection Time: 08/29/16  5:43 AM  Result Value Ref Range   WBC 8.4 4.0 - 10.5 K/uL   RBC 4.66 4.22 - 5.81 MIL/uL   Hemoglobin 14.6 13.0 - 17.0 g/dL   HCT 82.442.6 23.539.0 - 36.152.0 %   MCV 91.4 78.0 - 100.0 fL   MCH 31.3 26.0 - 34.0 pg   MCHC 34.3 30.0 - 36.0 g/dL   RDW 44.313.7 15.411.5 - 00.815.5 %   Platelets 199 150 - 400 K/uL  Basic metabolic panel     Status: Abnormal   Collection Time: 08/29/16  5:43 AM  Result Value Ref Range   Sodium 139 135 - 145 mmol/L   Potassium 3.3 (L) 3.5 - 5.1 mmol/L   Chloride 102 101 - 111 mmol/L   CO2 24 22 - 32 mmol/L   Glucose, Bld 115 (H) 65 - 99 mg/dL   BUN 22 (H) 6 - 20 mg/dL   Creatinine, Ser 6.761.09 0.61 - 1.24 mg/dL   Calcium 8.9 8.9 - 19.510.3 mg/dL   GFR calc non Af Amer >60 >60 mL/min   GFR calc Af Amer >60 >60 mL/min   Anion gap 13 5 - 15  Troponin I (q 6hr x 3)     Status: Abnormal   Collection Time: 08/29/16 10:29 AM  Result Value Ref Range   Troponin I 0.03 (HH) <0.03 ng/mL  MRSA PCR Screening     Status:  None   Collection Time: 08/29/16 12:03 PM  Result Value Ref Range   MRSA by PCR NEGATIVE NEGATIVE     [name]  Result Date: 08/29/2016 CLINICAL DATA:  81 y/o M; gradual generalized weakness and intermittent confusion. EXAM: MRI HEAD WITHOUT CONTRAST TECHNIQUE: Multiplanar, multiecho pulse sequences of the brain and surrounding structures were obtained without intravenous contrast. COMPARISON:  08/29/2016 CT head.  01/12/2016 MRI head. FINDINGS: Brain: Several small foci of diffusion restriction within the pons, bilateral occipital lobes, scattered in right posterior temporal lobe, and in the right thalamus compatible with acute/ early subacute infarction. The largest focus of infarction is in the right paramedian pons. Tiny lacunar infarcts within the bilateral cerebellar hemispheres. Moderate chronic microvascular ischemic changes and parenchymal volume loss. Punctate foci of susceptibility hypointensity within right frontal lobe, left thalamus, right insula, and right occipital lobe are compatible with hemosiderin deposition from old microhemorrhage. The Vascular: Increased signal within the right vertebral artery flow void may be related to atherosclerosis or chronic occlusion and is unchanged. Flow voids are  otherwise unremarkable. Skull and upper cervical spine: Normal marrow signal. Sinuses/Orbits: Small right maxillary sinus mucous retention cysts. Otherwise no abnormal signal of the paranasal sinuses or mastoid air cells. Bilateral intra-ocular lens replacement. Other: None. IMPRESSION: Several small foci of diffusion restriction within pons, bilateral occipital lobes, scattered in right posterior temporal lobe, and in the right thalamus compatible with acute/ early subacute infarction. No significant mass effect. No associated hemorrhage. These results will be called to the ordering clinician or representative by the Radiologist Assistant, and communication documented in the PACS or  zVision Dashboard. Electronically Signed   By: Mitzi HansenLance  Furusawa-Stratton M.D.   On: 08/29/2016 03:48    Physical Exam:    Frail elderly Caucasian male currently not in distress. . Afebrile. Head is nontraumatic. Neck is supple without bruit.    Cardiac exam no murmur or gallop. Lungs are clear to auscultation. Distal pulses are well felt. Neurological Exam :  Awake alert oriented 2. Able to recognize family members. Diminished attention, registration and recall. No aphasia or apraxia dysarthria. Extraocular moments are full range without nystagmus. Follows commands well. Pupils equal reactive. Blinks to threat bilaterally. Face symmetric. Tongue midline. Motor system exam able to move all 4 extremities well against gravity without focal weakness. Deep tendon pulses are symmetric. Sensation is intact. Plantars are downgoing. Gait was not tested. ASSESSMENT Adrian Melendez is a 81 y.o. male with  tiny bilateral posterior circulation embolic infarcts from atrial flutter fibrillation [facility]   Hospital day # 3  TREATMENT/PLAN  I had a long discussion with the patient and daughter regarding his recurrent embolic strokes despite anticoagulation with Xarelto. We discussed the available alternatives including switching to alternative blood thinner versus staying on the current plan as there is lack of definitive data showing superiority of any of the other agents over Xarelto. After discussion the patient and family want to stay on Xarelto for now. They will discuss this further with Dr. Roda ShuttersXu when they see him the office. Discussed with Dr. Roda ShuttersXu. Greater than 50% time during this 25 minute visit was spent on counseling and coordination of care about stroke, atrial fibrillation, treatment and answered questions  Delia HeadySETHI,Hilary Milks, MD [facility] Pager: [phone] 08/29/2016 5:25 PM

## 2016-08-29 NOTE — Progress Notes (Signed)
Text paged mid level provider that pt has been in AFIB during transfer to 4E from 5w. Hand off report stated pt has hx of and had been in A.Flutter

## 2016-08-29 NOTE — Progress Notes (Signed)
Attempting report; RN will call back in 20 minutes.

## 2016-08-29 NOTE — Progress Notes (Signed)
  Echocardiogram 2D Echocardiogram has been performed.  [name], [name] R 08/29/2016, 3:35 PM

## 2016-08-29 NOTE — Procedures (Signed)
History: 81 yo M with seizures  Sedation: None  Technique: This is a 21 channel routine scalp EEG performed at the bedside with bipolar and monopolar montages arranged in accordance to the international 10/20 system of electrode placement. One channel was dedicated to EKG recording.    Background: The background consists of intermixed alpha and beta activities with mild generalized superimposed irregular delta activiy. There is a moderately well defined posterior dominant rhythm of 8 Hz that attenuates with eye opening. Sleep is recorded with normal appearing structures.   Photic stimulation: Physiologic driving is not performed  EEG Abnormalities: 1) Generalized irregular slow activity.   Clinical Interpretation: This EEG is consistent with a mild generalized non-specific cerebral dysfunction(encephalopathy). There was no seizure or seizure predisposition recorded on this study. Please note that a normal EEG does not preclude the possibility of epilepsy.   Ritta SlotMcNeill Alexanderia Gorby, MD [facility] [phone]  If 7pm- 7am, please page neurology on call as listed in AMION.

## 2016-08-29 NOTE — Progress Notes (Signed)
CRITICAL VALUE ALERT  Critical value received:  Troponin 0.03  Date of notification:  08/29/16  Time of notification:  21:39  Critical value read back: yes  Nurse who received alert: Loma SenderBrittney Zaela Graley  MD notified (1st page): Donnamarie PoagK. Kirby  Time of first page: 23:08  MD notified (2nd page):  Time of second page:  Responding MD:  K.Kirby  Time MD responded:  23:45

## 2016-08-29 NOTE — Progress Notes (Signed)
Pt family refused all his scheduled eye drop, family said pt has completed recommended dose prescribed by his eye doctor so family dont want him to have it anymore except the artificial tears , will continue to monitor

## 2016-08-29 NOTE — Progress Notes (Signed)
Attempted to give pt scheduled night time meds and prn xanx but pt refused and hit RN. RT at bedside for assistance. RT attempted to apply Cpap but pt would not allow RT to apply. MD notified.

## 2016-08-29 NOTE — Progress Notes (Signed)
Patient was transported to 4E12; family accompanied him.

## 2016-08-29 NOTE — Evaluation (Signed)
Clinical/Bedside Swallow Evaluation Patient Details  Name: Adrian Melendez MRN: [id number] Date of Birth: 05/11/1935  Today's Date: 08/29/2016 Time: SLP Start Time (ACUTE ONLY): 1117 SLP Stop Time (ACUTE ONLY): 1133 SLP Time Calculation (min) (ACUTE ONLY): 16 min  Past Medical History:  Past Medical History:  Diagnosis Date  . Atrial flutter (HCC)    a. On xarelto  . CKD (chronic kidney disease), stage III   . Colon polyps    colonoscopy 2006  . Diastolic dysfunction   . Dizziness 02/29/2012  . Gout   . Gout   . Headache   . History of nuclear stress test 03/28/2012   lexiscan myoview; negative for ischemia  . Hx of migraines   . Hx-TIA (transient ischemic attack) June 2008  . Hyperglycemia   . Hyperlipidemia    statin intolerance  . Hyperlipidemia   . Hypertension   . Hypokalemia   . Migraine aura without headache   . Migraines    classic ophthalmic  . Stroke (HCC)   . Syncope, near 02/29/2012   a. Resulting in discontinuation of beta blocker.  . Urinary retention    Past Surgical History:  Past Surgical History:  Procedure Laterality Date  . COLONOSCOPY     2006,11/12  . EYE SURGERY     laser surgery to seal vein   . TRANSTHORACIC ECHOCARDIOGRAM  03/01/2012   EF 55-60% with normal systolic function; mild AV regurg; calcified mitral annulus - ordered for syncope   HPI:  Adrian PummelWilliam R Ethridgeis a 81 y.o.malewith a Past Medical History migraines, high cholesterol, gout, CHF, CKD, atrial flutter and stroke who presents with altered mental status. Found to have urinary tact infection. Likely due to urinary retention. Patient has had long-standing history of symptoms of urinary retention. Patient was stared on IV antibiotics, He didn't improved with treatment of infection. Neurologist consulted and MRI was ordered. MRI consistent with multiple stroke. Patient had an episode of unresponsiveness when he was getting out of the bed to bedside commode. His eyes rolled back,  and he was not responsive per daughter.    Assessment / Plan / Recommendation Clinical Impression  Pt demonstrates subtle signs concerning for dysphagia including slight throat clearing after swallowing thin liquids. Pts volitional cough is weak and breath support for volume is poor. Despite having tolerated a diet for several days since the CVA, recommend modification of diet to dys 3/nectar to reduce risk and f/u with MBS tomorrow. Pt and family in agreement.     Aspiration Risk  Moderate aspiration risk    Diet Recommendation Dysphagia 3 (Mech soft);Nectar-thick liquid   Liquid Administration via: Cup;Straw Medication Administration: Whole meds with puree Supervision: Patient able to self feed Compensations: Slow rate;Small sips/bites    Other  Recommendations     Follow up Recommendations Outpatient SLP      Frequency and Duration min 2x/week  2 weeks       Prognosis        Swallow Study   General HPI: Adrian PummelWilliam R Ethridgeis a 81 y.o.malewith a Past Medical History migraines, high cholesterol, gout, CHF, CKD, atrial flutter and stroke who presents with altered mental status. Found to have urinary tact infection. Likely due to urinary retention. Patient has had long-standing history of symptoms of urinary retention. Patient was stared on IV antibiotics, He didn't improved with treatment of infection. Neurologist consulted and MRI was ordered. MRI consistent with multiple stroke. Patient had an episode of unresponsiveness when he was getting out of the  bed to bedside commode. His eyes rolled back, and he was not responsive per daughter.  Type of Study: Bedside Swallow Evaluation Diet Prior to this Study: Regular;Thin liquids Temperature Spikes Noted: No Respiratory Status: Room air History of Recent Intubation: No Behavior/Cognition: Alert;Cooperative;Pleasant mood Oral Cavity Assessment: Within Functional Limits Oral Care Completed by SLP: No Oral Cavity - Dentition:  Adequate natural dentition Vision: Functional for self-feeding Self-Feeding Abilities: Able to feed self Patient Positioning: Upright in bed Baseline Vocal Quality: Low vocal intensity Volitional Cough: Weak Volitional Swallow: Able to elicit    Oral/Motor/Sensory Function Overall Oral Motor/Sensory Function: Within functional limits   Ice Chips     Thin Liquid Thin Liquid: Impaired Pharyngeal  Phase Impairments: Suspected delayed Swallow;Throat Clearing - Immediate    Nectar Thick Nectar Thick Liquid: Not tested   Honey Thick Honey Thick Liquid: Not tested   Puree Puree: Within functional limits   Solid   GO   Solid: Within functional limits Presentation: Self Adrian Melendez       Adrian Franca, MA CCC-SLP [phone]  Adrian Melendez, [name] 08/29/2016,11:45 AM

## 2016-08-29 NOTE — Progress Notes (Signed)
Pt. Was transferred to 4E and resting comfortably.

## 2016-08-29 NOTE — Progress Notes (Signed)
Report given to the nurse who is going to receive the patient in 314E12.

## 2016-08-29 NOTE — CV Procedure (Signed)
2D echo attempted but patient transferring, will try later

## 2016-08-29 NOTE — Progress Notes (Signed)
EEG completed, results pending. 

## 2016-08-29 NOTE — Progress Notes (Signed)
PT Cancellation Note  Patient Details Name: Mellody DrownWilliam R Gronewold MRN: [id number] DOB: 12/23/1934   Cancelled Treatment:     [facility].  Pt to be transferred to step down unit.  MD reports to defer treatment today.     Daunte Oestreich Artis DelayJ Eyana Stolze 08/29/2016, 9:55 AM  Joycelyn RuaAimee Taliah Porche, PTA pager [phone]

## 2016-08-29 NOTE — Progress Notes (Signed)
Patient has home CPAP beside and said he does not need any assistance.  Patient is aware to ask RN to call Respiratory if he needs further assistance.

## 2016-08-29 NOTE — Progress Notes (Signed)
RN was called in the patient room by his NT and daughter. They said the patient had an episode of syncope [facility] Patient is now alert and verbally responsive but slow in his responses. Very weak on his feet>VS in normal range. Patient able to follow commands, to move all 4 extremities, no facial dropping noted, no slurred speech. MD was informed and came to assess the patient. At this time patient is in bed and per MD order he will be transferred to step down. Per verbal order, RN hold all his PO medicine until SPL. Will continue to monitor.

## 2016-08-30 ENCOUNTER — Inpatient Hospital Stay (HOSPITAL_COMMUNITY): Payer: Medicare Other

## 2016-08-30 DIAGNOSIS — G9340 Encephalopathy, unspecified: Secondary | ICD-10-CM

## 2016-08-30 LAB — LIPID PANEL
CHOL/HDL RATIO: 3.2 ratio
CHOLESTEROL: 142 mg/dL (ref 0–200)
HDL: 44 mg/dL (ref >40)
LDL Cholesterol: 84 mg/dL (ref 0–99)
TRIGLYCERIDES: 70 mg/dL (ref <150)
VLDL: 14 mg/dL (ref 0–40)

## 2016-08-30 LAB — HEMOGLOBIN A1C
Hgb A1c MFr Bld: 6 % — ABNORMAL HIGH (ref 4.8–5.6)
Mean Plasma Glucose: 126 mg/dL

## 2016-08-30 MED ORDER — CLOPIDOGREL BISULFATE 75 MG PO TABS: 75.0000 mg | ORAL_TABLET | Freq: Every day | ORAL | Status: DC

## 2016-08-30 MED ORDER — HYDRALAZINE HCL 20 MG/ML IJ SOLN: 10.0000 mg | Freq: Once | INTRAMUSCULAR | Status: DC

## 2016-08-30 MED ORDER — HYDRALAZINE HCL 20 MG/ML IJ SOLN
10.0000 mg | INTRAMUSCULAR | Status: DC | PRN
  Administered 2016-08-30 – 2016-08-31 (×2): 10 mg via INTRAVENOUS
  Filled 2016-08-30 (×2): qty 1

## 2016-08-30 MED ORDER — ROSUVASTATIN CALCIUM 20 MG PO TABS: 20.0000 mg | ORAL_TABLET | Freq: Every day | ORAL | Status: DC

## 2016-08-30 NOTE — Progress Notes (Signed)
Speech Language Pathology Treatment: Dysphagia  Patient Details Name: Mellody DrownWilliam R Stuller MRN: [id number] DOB: 08/21/1935 Today's Date: 08/30/2016 Time: [id number] SLP Time Calculation (min) (ACUTE ONLY): 12 min  Assessment / Plan / Recommendation Clinical Impression  Skilled treatment session focused on addressing dysphagia goals. SLP facilitated session by providing Max assist multimodal cues for initial arousal and to maintain arousal to follow directions for use of safe swallow strategies while consuming teaspoon sips of nectar-thick liquid.  Patient required tactile cues for chin tuck posture and Max assist verbal cues for multiple swallows with delays of up to ~30 seconds.  Daughter reports that patient was more awake earlier.  Provided education on need for consistent arousal prior to diet initiation.  Continue with current plan of care.     HPI HPI: Parthenia AmesWilliam R Ethridgeis a 81 y.o.malewith a Past Medical History migraines, high cholesterol, gout, CHF, CKD, atrial flutter and stroke who presents with altered mental status. Found to have urinary tact infection. Likely due to urinary retention. Patient has had long-standing history of symptoms of urinary retention. Patient was stared on IV antibiotics, He didn't improved with treatment of infection. Neurologist consulted and MRI was ordered. MRI consistent with multiple stroke. Patient had an episode of unresponsiveness when he was getting out of the bed to bedside commode. His eyes rolled back, and he was not responsive per daughter.       SLP Plan  Continue with current plan of care     Recommendations  Diet recommendations: NPO Medication Administration: Crushed with puree Compensations: Slow rate;Small sips/bites;Chin tuck;Other (Comment) (when awake and alert) Postural Changes and/or Swallow Maneuvers: Seated upright 90 degrees                Oral Care Recommendations: Oral care BID Follow up Recommendations: Inpatient  Rehab;24 hour supervision/assistance Plan: Continue with current plan of care       GO              Charlane FerrettiMelissa Bular Hickok, M.A., CCC-SLP [phone]  Syla Devoss 08/30/2016, 4:07 PM

## 2016-08-30 NOTE — Progress Notes (Signed)
Notified MD Jomarie LongsJoseph that PT stood patient at bedside and he became unresponsive. When patient put back in bed he came around to his previous baseline which has been lethargic. BP 178/106, HR 70, resp.20, and o2 sat was 100% on room air. No new orders received will continue to monitor patient.

## 2016-08-30 NOTE — Progress Notes (Signed)
Stroke Team Progress Note  SUBJECTIVE Patient's daughter    is at the bedside. He is quite sleepy this morning. Review of the chart does not show any sedatives received in 24 hours.  OBJECTIVE Most recent Vital Signs: Temp: 98 F (36.7 C) (12/13 1249) Temp Source: Oral (12/13 1249) BP: 147/82 (12/13 1249) Pulse Rate: 62 (12/13 1249) Respiratory Rate: 15 O2 Saturdation: 100%  CBG (last 3)   Recent Labs  08/28/16 1525  GLUCAP 118*    Diet: Diet NPO time specified Except for: Sips with Meds  liquids  Activity: Up with assistance   VTE Prophylaxis:   xarelto  Studies: Results for orders placed or performed during the hospital encounter of 09/11/2016 (from the past 24 hour(s))  Troponin I (q 6hr x 3)     Status: None   Collection Time: 08/29/16  4:56 PM  Result Value Ref Range   Troponin I <0.03 <0.03 ng/mL  Hemoglobin A1c     Status: Abnormal   Collection Time: 08/29/16  4:56 PM  Result Value Ref Range   Hgb A1c MFr Bld 6.0 (H) 4.8 - 5.6 %   Mean Plasma Glucose 126 mg/dL  Troponin I (q 6hr x 3)     Status: Abnormal   Collection Time: 08/29/16  9:31 PM  Result Value Ref Range   Troponin I 0.03 (HH) <0.03 ng/mL  Lipid panel     Status: None   Collection Time: 08/30/16  2:23 AM  Result Value Ref Range   Cholesterol 142 0 - 200 mg/dL   Triglycerides 70 <914<150 mg/dL   HDL 44 >78>40 mg/dL   Total CHOL/HDL Ratio 3.2 RATIO   VLDL 14 0 - 40 mg/dL   LDL Cholesterol 84 0 - 99 mg/dL     [name]  Result Date: 08/29/2016 CLINICAL DATA:  [age] y/o M; gradual generalized weakness and intermittent confusion. EXAM: MRI HEAD WITHOUT CONTRAST TECHNIQUE: Multiplanar, multiecho pulse sequences of the brain and surrounding structures were obtained without intravenous contrast. COMPARISON:  08/30/2016 CT head.  01/12/2016 MRI head. FINDINGS: Brain: Several small foci of diffusion restriction within the pons, bilateral occipital lobes, scattered in right posterior temporal lobe, and in  the right thalamus compatible with acute/ early subacute infarction. The largest focus of infarction is in the right paramedian pons. Tiny lacunar infarcts within the bilateral cerebellar hemispheres. Moderate chronic microvascular ischemic changes and parenchymal volume loss. Punctate foci of susceptibility hypointensity within right frontal lobe, left thalamus, right insula, and right occipital lobe are compatible with hemosiderin deposition from old microhemorrhage. The Vascular: Increased signal within the right vertebral artery flow void may be related to atherosclerosis or chronic occlusion and is unchanged. Flow voids are otherwise unremarkable. Skull and upper cervical spine: Normal marrow signal. Sinuses/Orbits: Small right maxillary sinus mucous retention cysts. Otherwise no abnormal signal of the paranasal sinuses or mastoid air cells. Bilateral intra-ocular lens replacement. Other: None. IMPRESSION: Several small foci of diffusion restriction within pons, bilateral occipital lobes, scattered in right posterior temporal lobe, and in the right thalamus compatible with acute/ early subacute infarction. No significant mass effect. No associated hemorrhage. These results will be called to the ordering clinician or representative by the Radiologist Assistant, and communication documented in the PACS or zVision Dashboard. Electronically Signed   By: Adrian HansenLance  Melendez M.D.   On: 08/29/2016 03:48   Dg Swallowing Func-speech Pathology  Result Date: 08/30/2016 Objective Swallowing Evaluation: Type of Study: MBS-Modified Barium Swallow Study Patient Details Name: Adrian PummelWilliam R  Melendez MRN: [id number] Date of Birth: 10-Oct-1934 Today's Date: 08/30/2016 Time: SLP Start Time (ACUTE ONLY): 0906-SLP Stop Time (ACUTE ONLY): 0935 SLP Time Calculation (min) (ACUTE ONLY): 29 min Past Medical History: Past Medical History: Diagnosis Date . Atrial flutter (HCC)   a. On xarelto . CKD (chronic kidney disease), stage III  .  Colon polyps   colonoscopy 2006 . Diastolic dysfunction  . Dizziness 02/29/2012 . Gout  . Gout  . Headache  . History of nuclear stress test 03/28/2012  lexiscan myoview; negative for ischemia . Hx of migraines  . Hx-TIA (transient ischemic attack) June 2008 . Hyperglycemia  . Hyperlipidemia   statin intolerance . Hyperlipidemia  . Hypertension  . Hypokalemia  . Migraine aura without headache  . Migraines   classic ophthalmic . Stroke (HCC)  . Syncope, near 02/29/2012  a. Resulting in discontinuation of beta blocker. . Urinary retention  Past Surgical History: Past Surgical History: Procedure Laterality Date . COLONOSCOPY    2006,11/12 . EYE SURGERY    laser surgery to seal vein  . TRANSTHORACIC ECHOCARDIOGRAM  03/01/2012  EF 55-60% with normal systolic function; mild AV regurg; calcified mitral annulus - ordered for syncope HPI: Adrian Mcelreath Ethridgeis a 81 y.o.malewith a Past Medical History migraines, high cholesterol, gout, CHF, CKD, atrial flutter and stroke who presents with altered mental status. Found to have urinary tact infection. Likely due to urinary retention. Patient has had long-standing history of symptoms of urinary retention. Patient was stared on IV antibiotics, He didn't improved with treatment of infection. Neurologist consulted and MRI was ordered. MRI consistent with multiple stroke. Patient had an episode of unresponsiveness when he was getting out of the bed to bedside commode. His eyes rolled back, and he was not responsive per daughter.  No Data Recorded Assessment / Plan / Recommendation CHL IP CLINICAL IMPRESSIONS 08/30/2016 Therapy Diagnosis Moderate oral phase dysphagia;Moderate pharyngeal phase dysphagia Clinical Impression Pt demonstrates a moderate oral and oropharyngeal dysphagia complicated by lethargy and poor breath support for cough. Pt has brief oral holding of boluses, likely due to lethargy with premature spillage to pharynx. This, combined with delayed swallow initiation  leads to silent penetration/aspiration events before the swallow. Pt also has a baseline structural dysphagia due to appearance of bony protrusions on the anterior cervical spine that sometimes direct bolus/residuals directly into the vestibule and can also impede epiglottic deflection if pts effort with swallow is poor. Pt typically swallow twice to fully clear bolus, though often vallecular residuals remain. A chin tuck significantly aids in bolus passage. By the middle of the test however, pt was not able to follow commands and weak volitional cough and stronger reflexive coughing did not mobilize aspirates. Overall, if pt is alert and able to follow instructions for a chin tuck with solids and small sips of nectar thick liquids via cup, pt could consume POs with relatively low risk. At this time however pt is poorly alert and showing some signs of inability to protect airway by end of session (snoring respirations, gurgling) though SLP was able to suction his throat with improvement. Recommend pt remain NPO until arousal is consistent.  Impact on safety and function Severe aspiration risk   CHL IP TREATMENT RECOMMENDATION 08/30/2016 Treatment Recommendations Therapy as outlined in treatment plan below   Prognosis 08/30/2016 Prognosis for Safe Diet Advancement Fair Barriers to Reach Goals -- Barriers/Prognosis Comment -- CHL IP DIET RECOMMENDATION 08/30/2016 SLP Diet Recommendations (No Data) Liquid Administration via -- Medication Administration Crushed with puree Compensations  Slow rate;Small sips/bites;Chin tuck Postural Changes Seated upright at 90 degrees   CHL IP OTHER RECOMMENDATIONS 08/30/2016 Recommended Consults -- Oral Care Recommendations Oral care QID Other Recommendations Order thickener from pharmacy;Have oral suction available   CHL IP FOLLOW UP RECOMMENDATIONS 08/30/2016 Follow up Recommendations Inpatient Rehab   CHL IP FREQUENCY AND DURATION 08/30/2016 Speech Therapy Frequency (ACUTE ONLY) min  2x/week Treatment Duration 2 weeks      CHL IP ORAL PHASE 08/30/2016 Oral Phase Impaired Oral - Pudding Teaspoon -- Oral - Pudding Cup -- Oral - Honey Teaspoon -- Oral - Honey Cup -- Oral - Nectar Teaspoon -- Oral - Nectar Cup Delayed oral transit;Premature spillage Oral - Nectar Straw Delayed oral transit;Premature spillage Oral - Thin Teaspoon -- Oral - Thin Cup Delayed oral transit;Premature spillage Oral - Thin Straw Delayed oral transit;Premature spillage Oral - Puree Delayed oral transit;Premature spillage Oral - Mech Soft Delayed oral transit;Premature spillage Oral - Regular -- Oral - Multi-Consistency -- Oral - Pill -- Oral Phase - Comment --  CHL IP PHARYNGEAL PHASE 08/30/2016 Pharyngeal Phase Impaired Pharyngeal- Pudding Teaspoon -- Pharyngeal -- Pharyngeal- Pudding Cup -- Pharyngeal -- Pharyngeal- Honey Teaspoon -- Pharyngeal -- Pharyngeal- Honey Cup -- Pharyngeal -- Pharyngeal- Nectar Teaspoon -- Pharyngeal -- Pharyngeal- Nectar Cup Reduced epiglottic inversion;Delayed swallow initiation-pyriform sinuses;Penetration/Aspiration before swallow;Reduced tongue base retraction;Pharyngeal residue - valleculae;Compensatory strategies attempted (with notebox) Pharyngeal Material enters airway, remains ABOVE vocal cords and not ejected out;Material does not enter airway Pharyngeal- Nectar Straw Reduced epiglottic inversion;Delayed swallow initiation-pyriform sinuses;Penetration/Aspiration before swallow;Reduced tongue base retraction;Pharyngeal residue - valleculae;Compensatory strategies attempted (with notebox);Moderate aspiration Pharyngeal Material enters airway, passes BELOW cords without attempt by patient to eject out (silent aspiration);Material enters airway, remains ABOVE vocal cords then ejected out;Material does not enter airway Pharyngeal- Thin Teaspoon -- Pharyngeal -- Pharyngeal- Thin Cup Reduced epiglottic inversion;Delayed swallow initiation-pyriform sinuses;Penetration/Aspiration before  swallow;Reduced tongue base retraction;Pharyngeal residue - valleculae;Compensatory strategies attempted (with notebox) Pharyngeal Material enters airway, passes BELOW cords without attempt by patient to eject out (silent aspiration) Pharyngeal- Thin Straw Reduced epiglottic inversion;Delayed swallow initiation-pyriform sinuses;Penetration/Aspiration before swallow;Reduced tongue base retraction;Pharyngeal residue - valleculae;Compensatory strategies attempted (with notebox) Pharyngeal Material enters airway, passes BELOW cords without attempt by patient to eject out (silent aspiration) Pharyngeal- Puree Delayed swallow initiation-vallecula;Reduced epiglottic inversion;Pharyngeal residue - valleculae Pharyngeal -- Pharyngeal- Mechanical Soft Delayed swallow initiation-vallecula;Reduced epiglottic inversion;Pharyngeal residue - valleculae Pharyngeal -- Pharyngeal- Regular -- Pharyngeal -- Pharyngeal- Multi-consistency -- Pharyngeal -- Pharyngeal- Pill -- Pharyngeal -- Pharyngeal Comment --  No flowsheet data found. No flowsheet data found. Harlon DittyBonnie DeBlois, [place]MA CCC-SLP [phone] Dyanne IhaDeBlois, Riley NearingBonnie Caroline 08/30/2016, 10:37 AM               Physical Exam:    Frail elderly Caucasian male currently not in distress. . Afebrile. Head is nontraumatic. Neck is supple without bruit.    Cardiac exam no murmur or gallop. Lungs are clear to auscultation. Distal pulses are well felt. Neurological Exam :  Patient is sleepy but can be aroused. Follows simple commands and moves all 4 extremities well.. Extraocular moments are full range without nystagmus. Follows commands well. Pupils equal reactive. Blinks to threat bilaterally. Face symmetric. Tongue midline. Motor system exam able to move all 4 extremities well against gravity without focal weakness. Deep tendon pulses are symmetric. Sensation is intact. Plantars are downgoing. Gait was not tested. ASSESSMENT Adrian Melendez is a 81 y.o. male with  tiny bilateral  posterior circulation embolic infarcts from atrial flutter fibrillation despite being on  anticoagulation with [facility] day # 4  TREATMENT/PLAN  I had a long discussion with the patient and daughter regarding his recurrent embolic strokes despite anticoagulation with Xarelto. We discussed the available alternatives including switching to alternative blood thinner versus staying on the current plan as there is lack of definitive data showing superiority of any of the other agents over Xarelto. After discussion the patient and family want to stay on Xarelto for now.I didl discuss this further with Dr. Roda Shutters who agrees and  will see him the office.  . Greater than 50% time during this 15 minute visit was spent on counseling and coordination of care about stroke, atrial fibrillation, treatment and answered questions. Stroke team will sign off. Follow-up as an outpatient with Dr. Rayburn Ma, MD [facility] Pager: [phone] 08/30/2016 4:30 PM

## 2016-08-30 NOTE — Progress Notes (Signed)
PT Cancellation Note  Patient Details Name: Adrian DrownWilliam R Melendez MRN: [id number] DOB: 02/14/1935   Cancelled Treatment:    Reason Eval/Treat Not Completed: Patient at procedure or test/unavailable. Pt is off of the floor for a procedure. PT will continue to f/u with pt as appropriate.   Alessandra BevelsJennifer M Dana Dorner 08/30/2016, 11:49 AM Deborah ChalkJennifer Kamaron Deskins, PT, DPT [phone]

## 2016-08-30 NOTE — Progress Notes (Signed)
Rehab Admissions Coordinator Note:  Patient was screened by Trish MageLogue, Ariatna Jester M for appropriateness for an Inpatient Acute Rehab Consult.  At this time, we are recommending HH.  Noted PT recommending HH and SLP recommending outpatient therapy.  If patient suffers a significant functional decline, then can reconsider inpatient rehab consult.  Trish MageLogue, Kamia Insalaco M 08/30/2016, 11:44 AM  I can be reached at [phone].

## 2016-08-30 NOTE — Progress Notes (Signed)
RN called to room by patients daughter. Daughter was concerned patient has said he ws having pain behind his right eye.RN asked patient what type of pain it was and he stated"felt like a headache". Patient daughter requested I call and notify MD. MD Jomarie LongsJoseph notified, awaiting new orders will continue to monitor patient.

## 2016-08-30 NOTE — Progress Notes (Addendum)
Physical Therapy Treatment Patient Details Name: Adrian DrownWilliam R Melendez MRN: [id number] DOB: 08/28/1935 Today's Date: 08/30/2016    History of Present Illness 81 y.o. male with a Past Medical History  migraines, high cholesterol, gout, CHF, CK D, atrial flutter and stroke who presents with altered mental status. Found to have urinary tact infection. Likely due to urinary retention. Patient has had long-standing history of symptoms of urinary retention.    PT Comments    Pt presented supine in bed with HOB elevated, awake and willing to participate in therapy session. Pt very limited this session secondary to lethargy. Of note, during standing this session pt became unresponsive to conversation and questions. He maintained eyes open; however, with a sustained upward visual gaze. Pt was assisted into sitting at EOB, at which time he was able to answer his last name and respond to additional questions but with very delayed answers. Pt required max A x2 to return to supine in bed. Pt with intermittent episodes of falling asleep throughout session as well. [name] nurse was notified of this at end of session.  Pt would continue to benefit from skilled physical therapy services at this time while admitted and after d/c to address his limitations in order to improve his overall safety and independence with functional mobility. Pt is currently refusing to d/c to SNF, and pt's daughter reporting that she does not feel safe with him returning home [facility].   Follow Up Recommendations  CIR     Equipment Recommendations  Rolling walker with 5" wheels;3in1 (PT)    Recommendations for Other Services Rehab consult     Precautions / Restrictions Precautions Precautions: Fall Restrictions Weight Bearing Restrictions: No    Mobility  Bed Mobility Overal bed mobility: Needs Assistance Bed Mobility: Supine to Sit;Sit to Supine     Supine to sit: Mod assist;HOB elevated Sit to supine: Max  assist;+2 for physical assistance   General bed mobility comments: pt required increased time, use of bed rails, verbal and tactile cues for sequencing and mod A at trunk to achieve sitting EOB with use of bed pad to position hips. Pt also required max A x2 (at trunk and bilateral LEs) to return to supine from sitting  Transfers Overall transfer level: Needs assistance Equipment used: Rolling walker (2 wheeled) Transfers: Sit to/from Stand Sit to Stand: Mod assist;+2 safety/equipment         General transfer comment: pt required increased time, VC'ing for hand placement and sequencing. Despite max VC'ing, pt continued to use bilateral hands on RW to pull up into standing. Mod a required to achieve full standing from bed.  Ambulation/Gait             General Gait Details: did not occur secondary to lethargy and unresponsive episode in standing   Stairs            Wheelchair Mobility    Modified Rankin (Stroke Patients Only) Modified Rankin (Stroke Patients Only) Pre-Morbid Rankin Score: Slight disability Modified Rankin: Moderately severe disability     Balance Overall balance assessment: Needs assistance Sitting-balance support: Feet supported;Bilateral upper extremity supported Sitting balance-Leahy Scale: Poor Sitting balance - Comments: pt reliant on bilateral UE supports on bed, with min guard for safety with static sitting balance   Standing balance support: Bilateral upper extremity supported;During functional activity Standing balance-Leahy Scale: Poor Standing balance comment: pt reliant on bilateral UEs on RW. During standing, pt became unresponsive to conversation and questions. He maintained eyes open; however,  with a sustained upward visual gaze. Pt was assisted into sitting at EOB at which time he was able to answer his last name and respond to additional questions but with very delayed answers.                     Cognition Arousal/Alertness:  Lethargic Behavior During Therapy: Flat affect Overall Cognitive Status: Impaired/Different from baseline Area of Impairment: Orientation;Attention;Memory;Following commands;Safety/judgement;Awareness;Problem solving Orientation Level: Disoriented to;Time;Situation Current Attention Level: Selective Memory: Decreased short-term memory Following Commands: Follows one step commands with increased time;Follows multi-step commands inconsistently Safety/Judgement: Decreased awareness of safety;Decreased awareness of deficits   Problem Solving: Slow processing;Decreased initiation;Requires verbal cues;Requires tactile cues General Comments: pt's daughter was present throughout session and reported that this was very different from pt's baseline    Exercises      General Comments        Pertinent Vitals/Pain Pain Assessment: No/denies pain   BP was WNL at beginning of session, but increased to 178/106 mmHg at end of session with pt in supine. [name] nurse was notified. All other VSS throughout.    Home Living                      Prior Function            PT Goals (current goals can now be found in the care plan section) Acute Rehab PT Goals Patient Stated Goal: too lethargic this session to state any goals PT Goal Formulation: With patient/family Time For Goal Achievement: 09/10/16 Potential to Achieve Goals: Good Progress towards PT goals: Progressing toward goals    Frequency    Min 3X/week      PT Plan Current plan remains appropriate    Co-evaluation             End of Session Equipment Utilized During Treatment: Gait belt Activity Tolerance: Patient limited by lethargy;Patient limited by fatigue Patient left: in bed;with call bell/phone within reach;with family/visitor present     Time: [id number] PT Time Calculation (min) (ACUTE ONLY): 29 min  Charges:  $Therapeutic Activity: 23-37 mins                    G CodesAlessandra Melendez:      Adrian Melendez  Adrian Melendez 08/30/2016, 5:16 PM Deborah ChalkJennifer Chad Donoghue, PT, DPT [phone]

## 2016-08-30 NOTE — Progress Notes (Signed)
Modified Barium Swallow Progress Note  Patient Details  Name: Mellody DrownWilliam R Allbright MRN: [id number] Date of Birth: 03/26/1935  Today's Date: 08/30/2016  Modified Barium Swallow completed.  Full report located under Chart Review in the Imaging Section.  Brief recommendations include the following:  Clinical Impression  Pt demonstrates a moderate oral and oropharyngeal dysphagia complicated by lethargy and poor breath support for cough. Pt has brief oral holding of boluses, likely due to lethargy with premature spillage to pharynx. This, combined with delayed swallow initiation leads to silent penetration/aspiration events before the swallow. Pt also has a baseline structural dysphagia due to appearance of bony protrusions on the anterior cervical spine that sometimes direct bolus/residuals directly into the vestibule and can also impede epiglottic deflection if pts effort with swallow is poor. Pt typically swallow twice to fully clear bolus, though often vallecular residuals remain. A chin tuck significantly aids in bolus passage. By the middle of the test however, pt was not able to follow commands and weak volitional cough and stronger reflexive coughing did not mobilize aspirates. Overall, if pt is alert and able to follow instructions for a chin tuck with solids and small sips of nectar thick liquids via cup, pt could consume POs with relatively low risk. At this time however pt is poorly alert and showing some signs of inability to protect airway by end of session (snoring respirations, gurgling) though SLP was able to suction his throat with improvement. Recommend pt remain NPO until arousal is consistent.    Swallow Evaluation Recommendations       SLP Diet Recommendations:  (when alert - nectar/dys 3 with a chin tuck)       Medication Administration: Crushed with puree   Supervision: Full supervision/cueing for compensatory strategies   Compensations: Slow rate;Small sips/bites;Chin  tuck   Postural Changes: Seated upright at 90 degrees   Oral Care Recommendations: Oral care QID   Other Recommendations: Order thickener from pharmacy;Have oral suction available    Eliz Nigg, Riley NearingBonnie Caroline 08/30/2016,10:33 AM

## 2016-08-30 NOTE — Care Management Important Message (Signed)
Important Message  Patient Details  Name: Adrian Melendez MRN: [id number] Date of Birth: 03/10/1935   Medicare Important Message Given:  Yes    [name] 08/30/2016, 10:23 AM

## 2016-08-30 NOTE — Progress Notes (Signed)
*  PRELIMINARY RESULTS* Vascular Ultrasound Carotid Duplex (Doppler) has been completed.  Preliminary findings: Findings consistent with a 1-39 percent stenosis involving the right internal carotid artery and the left internal carotid artery. Findings similar to 01/07/2016 study.  Bilateral vertebral arteries are patent and antegrade however the right vertebral has an abnormal waveform.  Adrian FischerCharlotte C Shanequia Melendez 08/30/2016, 11:51 AM

## 2016-08-30 NOTE — Progress Notes (Signed)
PROGRESS NOTE    Adrian DrownWilliam R Melendez  WUJ:[id number] DOB: 05/29/1935 DOA: 09/09/2016 PCP: Garlan FillersPATERSON,DANIEL G, MD    Brief Narrative: Adrian Melendez is a 81 y.o. male with a Past Medical History  migraines, high cholesterol, gout, CHF, CKD, atrial flutter and stroke who presented with altered mental status. Found to have urinary tact infection. Likely due to urinary retention. Patient has had long-standing history of symptoms of urinary retention. Patient was stared on IV antibiotics,  He didn't improved with treatment of infection. Neurologist consulted and MRI was ordered. MRI consistent with multiple strokes.  Then 12/12 patient had an episode of unresponsiveness when he was getting out of the bed to bedside commode. His eyes rolled  back, and he was not responsive per daughter.   Assessment & Plan:   Acute/Sub acute stroke;  -MRI positive for several small foci of diffusion restriction within pons, bilateral occipital lobes, scattered in right posterior temporal lobe, and in the right thalamus compatible with acute/ early subacute infarction. -despite compliance with xarelto for Afib -SLp eval ongoing, NPO recommended today -continue Xarelto -ECHo pending, carotid duplex unremarkable -Pt eval completed, HH recommended -NEuro following  Syncope;  Patient had unresponsive syncopal episode on 12/12, no events on tele -long history of orthostatic hypotension and just recently started on Flomax inpatient which likely contributed -Stopped Flomax -Follow-up 2-D echocardiogram -EEG without seizure activity  Acute encephalopathy -Suspect related to stroke compounded by delirium  -Hold sedating meds, low-dose Xanax which he only takes  PRN and had stopped that 2 weeks ago anyway per daughter -RPR negative. Ammonia 26.m, B 12 normal.  -Frequent orientation, OOB to chair etc ABG with normal CO2,   Urine retention,  -Received 4 days of IV antibiotics.  -Renal US; No hydronephrosis. The  bladder is completely decompressed by Foley catheter.  -urine culture with no growth. -stopped antibiotics.   Hypokalemia; repleted IV .   A fib; on xarelto.  -stable now  HTN; Hold Norvasc, due to syncope and acute CVA  DVT prophylaxis: on xarelto  Code Status: Full code.  Family Communication: daughter at bedside.  Disposition Plan: home when improved, ? 2days, failed SLP eval today  Consultants:   none   Procedures: none   Antimicrobials: ceftriaxone 12-09   Subjective: Was agitated last pm and didn't sleep until early am   Objective: Vitals:   08/30/16 0300 08/30/16 0400 08/30/16 0818 08/30/16 1249  BP: (!) 189/97 (!) 159/85 (!) 167/96 (!) 147/82  Pulse: 68 74 68 62  Resp: (!) 21 19 17 15   Temp: 98 F (36.7 C)  98 F (36.7 C) 98 F (36.7 C)  TempSrc: Oral  Oral Oral  SpO2: 100% 100% 100% 100%  Weight:      Height:        Intake/Output Summary (Last 24 hours) at 08/30/16 1310 Last data filed at 08/30/16 1252  Gross per 24 hour  Intake                0 ml  Output             1345 ml  Net            -1345 ml   Filed Weights   27-Mar-2016 1413 08/29/16 1154  Weight: 93.3 kg (205 lb 9.6 oz) 92.4 kg (203 lb 12.8 oz)    Examination:  General exam: somnolent, arousable, no distress Respiratory system: decreased BS at bases Cardiovascular system: S1 & S2 heard, RRR. No JVD, murmurs, rubs,  gallops or clicks. No pedal edema. Gastrointestinal system: Abdomen is nondistended, soft and nontender.Normal bowel sounds heard. Central nervous system:  No focal neurological deficits confused. . Extremities: Symmetric 5 x 5 power. Skin: No rashes, lesions or ulcers  Data Reviewed: I have personally reviewed following labs and imaging studies  CBC:  Recent Labs Lab 09/08/2016 0930 08/30/2016 1242 08/26/16 0520 08/27/16 0011 08/29/16 0543  WBC 11.5*  --  9.6 9.2 8.4  NEUTROABS 9.5*  --   --   --   --   HGB 15.0  --  13.6 13.7 14.6  HCT 44.1 42.9 40.7 40.3 42.6    MCV 91.5  --  92.1 92.6 91.4  PLT 198  --  174 169 199   Basic Metabolic Panel:  Recent Labs Lab 09/01/2016 0931 08/26/16 0520 08/27/16 0435 08/28/16 0617 08/29/16 0543  NA 140 142 140 140 139  K 2.8* 3.4* 3.4* 3.6 3.3*  CL 104 107 103 104 102  CO2 23 24 28 25 24   GLUCOSE 125* 107* 99 108* 115*  BUN 24* 18 16 19  22*  CREATININE 1.31* 1.01 1.05 1.10 1.09  CALCIUM 9.0 8.7* 8.6* 8.8* 8.9  MG 1.7  --   --   --   --    GFR: Estimated Creatinine Clearance: 60.3 mL/min (by C-G formula based on SCr of 1.09 mg/dL). Liver Function Tests:  Recent Labs Lab 09/05/2016 0931  AST 31  ALT 27  ALKPHOS 114  BILITOT 1.1  PROT 6.7  ALBUMIN 4.1   No results for input(s): LIPASE, AMYLASE in the last 168 hours.  Recent Labs Lab 09/10/2016 1120  AMMONIA 26   Coagulation Profile:  Recent Labs Lab 08/30/2016 1120 08/27/16 0011  INR 1.31 2.32   Cardiac Enzymes:  Recent Labs Lab 08/29/16 1029 08/29/16 1656 08/29/16 2131  TROPONINI 0.03* <0.03 0.03*   BNP (last 3 results) No results for input(s): PROBNP in the last 8760 hours. HbA1C:  Recent Labs  08/29/16 1656  HGBA1C 6.0*   CBG:  Recent Labs Lab 08/24/2016 0845 08/28/16 1525  GLUCAP 141* 118*   Lipid Profile:  Recent Labs  08/30/16 0223  CHOL 142  HDL 44  LDLCALC 84  TRIG 70  CHOLHDL 3.2   Thyroid Function Tests: No results for input(s): TSH, T4TOTAL, FREET4, T3FREE, THYROIDAB in the last 72 hours. Anemia Panel: No results for input(s): VITAMINB12, FOLATE, FERRITIN, TIBC, IRON, RETICCTPCT in the last 72 hours. Sepsis Labs: No results for input(s): PROCALCITON, LATICACIDVEN in the last 168 hours.  Recent Results (from the past 240 hour(s))  Urine culture     Status: None   Collection Time: 09/02/2016  9:42 AM  Result Value Ref Range Status   Specimen Description URINE, RANDOM  Final   Special Requests NONE  Final   Culture NO GROWTH  Final   Report Status 08/26/2016 FINAL  Final  MRSA PCR Screening      Status: None   Collection Time: 08/29/16 12:03 PM  Result Value Ref Range Status   MRSA by PCR NEGATIVE NEGATIVE Final    Comment:        The GeneXpert MRSA Assay (FDA approved for NASAL specimens only), is one component of a comprehensive MRSA colonization surveillance program. It is not intended to diagnose MRSA infection nor to guide or monitor treatment for MRSA infections.          Radiology Studies: [name]  Result Date: 08/29/2016 CLINICAL DATA:  81 y/o M; gradual  generalized weakness and intermittent confusion. EXAM: MRI HEAD WITHOUT CONTRAST TECHNIQUE: Multiplanar, multiecho pulse sequences of the brain and surrounding structures were obtained without intravenous contrast. COMPARISON:  2016-08-26 CT head.  01/12/2016 MRI head. FINDINGS: Brain: Several small foci of diffusion restriction within the pons, bilateral occipital lobes, scattered in right posterior temporal lobe, and in the right thalamus compatible with acute/ early subacute infarction. The largest focus of infarction is in the right paramedian pons. Tiny lacunar infarcts within the bilateral cerebellar hemispheres. Moderate chronic microvascular ischemic changes and parenchymal volume loss. Punctate foci of susceptibility hypointensity within right frontal lobe, left thalamus, right insula, and right occipital lobe are compatible with hemosiderin deposition from old microhemorrhage. The Vascular: Increased signal within the right vertebral artery flow void may be related to atherosclerosis or chronic occlusion and is unchanged. Flow voids are otherwise unremarkable. Skull and upper cervical spine: Normal marrow signal. Sinuses/Orbits: Small right maxillary sinus mucous retention cysts. Otherwise no abnormal signal of the paranasal sinuses or mastoid air cells. Bilateral intra-ocular lens replacement. Other: None. IMPRESSION: Several small foci of diffusion restriction within pons, bilateral occipital lobes,  scattered in right posterior temporal lobe, and in the right thalamus compatible with acute/ early subacute infarction. No significant mass effect. No associated hemorrhage. These results will be called to the ordering clinician or representative by the Radiologist Assistant, and communication documented in the PACS or zVision Dashboard. Electronically Signed   By: Mitzi Hansen M.D.   On: 08/29/2016 03:48   Dg Swallowing Func-speech Pathology  Result Date: 08/30/2016 Objective Swallowing Evaluation: Type of Study: MBS-Modified Barium Swallow Study Patient Details Name: Adrian Melendez MRN: [id number] Date of Birth: June 24, 1935 Today's Date: 08/30/2016 Time: SLP Start Time (ACUTE ONLY): 0906-SLP Stop Time (ACUTE ONLY): 0935 SLP Time Calculation (min) (ACUTE ONLY): 29 min Past Medical History: Past Medical History: Diagnosis Date . Atrial flutter (HCC)   a. On xarelto . CKD (chronic kidney disease), stage III  . Colon polyps   colonoscopy 2006 . Diastolic dysfunction  . Dizziness 02/29/2012 . Gout  . Gout  . Headache  . History of nuclear stress test 03/28/2012  lexiscan myoview; negative for ischemia . Hx of migraines  . Hx-TIA (transient ischemic attack) June 2008 . Hyperglycemia  . Hyperlipidemia   statin intolerance . Hyperlipidemia  . Hypertension  . Hypokalemia  . Migraine aura without headache  . Migraines   classic ophthalmic . Stroke (HCC)  . Syncope, near 02/29/2012  a. Resulting in discontinuation of beta blocker. . Urinary retention  Past Surgical History: Past Surgical History: Procedure Laterality Date . COLONOSCOPY    2006,11/12 . EYE SURGERY    laser surgery to seal vein  . TRANSTHORACIC ECHOCARDIOGRAM  03/01/2012  EF 55-60% with normal systolic function; mild AV regurg; calcified mitral annulus - ordered for syncope HPI: Adrian Kleckner Ethridgeis a 81 y.o.malewith a Past Medical History migraines, high cholesterol, gout, CHF, CKD, atrial flutter and stroke who presents with altered mental  status. Found to have urinary tact infection. Likely due to urinary retention. Patient has had long-standing history of symptoms of urinary retention. Patient was stared on IV antibiotics, He didn't improved with treatment of infection. Neurologist consulted and MRI was ordered. MRI consistent with multiple stroke. Patient had an episode of unresponsiveness when he was getting out of the bed to bedside commode. His eyes rolled back, and he was not responsive per daughter.  No Data Recorded Assessment / Plan / Recommendation CHL IP CLINICAL IMPRESSIONS 08/30/2016 Therapy Diagnosis Moderate  oral phase dysphagia;Moderate pharyngeal phase dysphagia Clinical Impression Pt demonstrates a moderate oral and oropharyngeal dysphagia complicated by lethargy and poor breath support for cough. Pt has brief oral holding of boluses, likely due to lethargy with premature spillage to pharynx. This, combined with delayed swallow initiation leads to silent penetration/aspiration events before the swallow. Pt also has a baseline structural dysphagia due to appearance of bony protrusions on the anterior cervical spine that sometimes direct bolus/residuals directly into the vestibule and can also impede epiglottic deflection if pts effort with swallow is poor. Pt typically swallow twice to fully clear bolus, though often vallecular residuals remain. A chin tuck significantly aids in bolus passage. By the middle of the test however, pt was not able to follow commands and weak volitional cough and stronger reflexive coughing did not mobilize aspirates. Overall, if pt is alert and able to follow instructions for a chin tuck with solids and small sips of nectar thick liquids via cup, pt could consume POs with relatively low risk. At this time however pt is poorly alert and showing some signs of inability to protect airway by end of session (snoring respirations, gurgling) though SLP was able to suction his throat with improvement. Recommend  pt remain NPO until arousal is consistent.  Impact on safety and function Severe aspiration risk   CHL IP TREATMENT RECOMMENDATION 08/30/2016 Treatment Recommendations Therapy as outlined in treatment plan below   Prognosis 08/30/2016 Prognosis for Safe Diet Advancement Fair Barriers to Reach Goals -- Barriers/Prognosis Comment -- CHL IP DIET RECOMMENDATION 08/30/2016 SLP Diet Recommendations (No Data) Liquid Administration via -- Medication Administration Crushed with puree Compensations Slow rate;Small sips/bites;Chin tuck Postural Changes Seated upright at 90 degrees   CHL IP OTHER RECOMMENDATIONS 08/30/2016 Recommended Consults -- Oral Care Recommendations Oral care QID Other Recommendations Order thickener from pharmacy;Have oral suction available   CHL IP FOLLOW UP RECOMMENDATIONS 08/30/2016 Follow up Recommendations Inpatient Rehab   CHL IP FREQUENCY AND DURATION 08/30/2016 Speech Therapy Frequency (ACUTE ONLY) min 2x/week Treatment Duration 2 weeks      CHL IP ORAL PHASE 08/30/2016 Oral Phase Impaired Oral - Pudding Teaspoon -- Oral - Pudding Cup -- Oral - Honey Teaspoon -- Oral - Honey Cup -- Oral - Nectar Teaspoon -- Oral - Nectar Cup Delayed oral transit;Premature spillage Oral - Nectar Straw Delayed oral transit;Premature spillage Oral - Thin Teaspoon -- Oral - Thin Cup Delayed oral transit;Premature spillage Oral - Thin Straw Delayed oral transit;Premature spillage Oral - Puree Delayed oral transit;Premature spillage Oral - Mech Soft Delayed oral transit;Premature spillage Oral - Regular -- Oral - Multi-Consistency -- Oral - Pill -- Oral Phase - Comment --  CHL IP PHARYNGEAL PHASE 08/30/2016 Pharyngeal Phase Impaired Pharyngeal- Pudding Teaspoon -- Pharyngeal -- Pharyngeal- Pudding Cup -- Pharyngeal -- Pharyngeal- Honey Teaspoon -- Pharyngeal -- Pharyngeal- Honey Cup -- Pharyngeal -- Pharyngeal- Nectar Teaspoon -- Pharyngeal -- Pharyngeal- Nectar Cup Reduced epiglottic inversion;Delayed swallow  initiation-pyriform sinuses;Penetration/Aspiration before swallow;Reduced tongue base retraction;Pharyngeal residue - valleculae;Compensatory strategies attempted (with notebox) Pharyngeal Material enters airway, remains ABOVE vocal cords and not ejected out;Material does not enter airway Pharyngeal- Nectar Straw Reduced epiglottic inversion;Delayed swallow initiation-pyriform sinuses;Penetration/Aspiration before swallow;Reduced tongue base retraction;Pharyngeal residue - valleculae;Compensatory strategies attempted (with notebox);Moderate aspiration Pharyngeal Material enters airway, passes BELOW cords without attempt by patient to eject out (silent aspiration);Material enters airway, remains ABOVE vocal cords then ejected out;Material does not enter airway Pharyngeal- Thin Teaspoon -- Pharyngeal -- Pharyngeal- Thin Cup Reduced epiglottic inversion;Delayed swallow initiation-pyriform sinuses;Penetration/Aspiration before swallow;Reduced  tongue base retraction;Pharyngeal residue - valleculae;Compensatory strategies attempted (with notebox) Pharyngeal Material enters airway, passes BELOW cords without attempt by patient to eject out (silent aspiration) Pharyngeal- Thin Straw Reduced epiglottic inversion;Delayed swallow initiation-pyriform sinuses;Penetration/Aspiration before swallow;Reduced tongue base retraction;Pharyngeal residue - valleculae;Compensatory strategies attempted (with notebox) Pharyngeal Material enters airway, passes BELOW cords without attempt by patient to eject out (silent aspiration) Pharyngeal- Puree Delayed swallow initiation-vallecula;Reduced epiglottic inversion;Pharyngeal residue - valleculae Pharyngeal -- Pharyngeal- Mechanical Soft Delayed swallow initiation-vallecula;Reduced epiglottic inversion;Pharyngeal residue - valleculae Pharyngeal -- Pharyngeal- Regular -- Pharyngeal -- Pharyngeal- Multi-consistency -- Pharyngeal -- Pharyngeal- Pill -- Pharyngeal -- Pharyngeal Comment --  No  flowsheet data found. No flowsheet data found. Harlon Ditty, [place] CCC-SLP [phone] Claudine Mouton 08/30/2016, 10:37 AM                   Scheduled Meds: . allopurinol  450 mg Oral Daily  . aspirin EC  81 mg Oral Daily  . clopidogrel  75 mg Oral Daily  . divalproex  500 mg Oral Daily  . haloperidol lactate  1 mg Intravenous Once  . mouth rinse  15 mL Mouth Rinse BID  . pneumococcal 23 valent vaccine  0.5 mL Intramuscular Tomorrow-1000  . polyethylene glycol  17 g Oral Daily  . rivaroxaban  20 mg Oral Q supper  . saccharomyces boulardii  250 mg Oral BID  . sertraline  25 mg Oral QHS   Continuous Infusions:   LOS: 4 days    Time spent:35 minutes.     Zannie Cove, MD [facility] Pager [phone]  If 7PM-7AM, please contact night-coverage [email or web address] Password [email or web address] 08/30/2016, 1:10 PM

## 2016-08-31 ENCOUNTER — Inpatient Hospital Stay (HOSPITAL_COMMUNITY): Payer: Medicare Other

## 2016-08-31 DIAGNOSIS — I6312 Cerebral infarction due to embolism of basilar artery: Secondary | ICD-10-CM

## 2016-08-31 DIAGNOSIS — G9340 Encephalopathy, unspecified: Secondary | ICD-10-CM

## 2016-08-31 LAB — URINALYSIS, ROUTINE W REFLEX MICROSCOPIC
Bilirubin Urine: NEGATIVE
GLUCOSE, UA: NEGATIVE mg/dL
KETONES UR: NEGATIVE mg/dL
NITRITE: NEGATIVE
PH: 5 (ref 5.0–8.0)
Protein, ur: 100 mg/dL — AB
Specific Gravity, Urine: 1.021 (ref 1.005–1.030)

## 2016-08-31 LAB — BASIC METABOLIC PANEL
ANION GAP: 14 (ref 5–15)
BUN: 28 mg/dL — ABNORMAL HIGH (ref 6–20)
CALCIUM: 9 mg/dL (ref 8.9–10.3)
CO2: 23 mmol/L (ref 22–32)
Chloride: 106 mmol/L (ref 101–111)
Creatinine, Ser: 1.1 mg/dL (ref 0.61–1.24)
GFR calc non Af Amer: >60 mL/min (ref >60)
Glucose, Bld: 128 mg/dL — ABNORMAL HIGH (ref 65–99)
POTASSIUM: 3.4 mmol/L — AB (ref 3.5–5.1)
Sodium: 143 mmol/L (ref 135–145)

## 2016-08-31 LAB — VAS US CAROTID
LCCAPDIAS: 9 cm/s
LCCAPSYS: 70 cm/s
LEFT ECA DIAS: -8 cm/s
LEFT VERTEBRAL DIAS: -10 cm/s
LICADDIAS: -21 cm/s
LICAPSYS: -48 cm/s
Left CCA dist dias: -8 cm/s
Left CCA dist sys: -76 cm/s
Left ICA dist sys: -69 cm/s
Left ICA prox dias: -9 cm/s
RCCAPSYS: 57 cm/s
RIGHT ECA DIAS: -13 cm/s
RIGHT VERTEBRAL DIAS: -12 cm/s
Right CCA prox dias: 15 cm/s
Right cca dist sys: -40 cm/s

## 2016-08-31 LAB — CBC
HEMATOCRIT: 46.7 % (ref 39.0–52.0)
HEMOGLOBIN: 15.7 g/dL (ref 13.0–17.0)
MCH: 31.2 pg (ref 26.0–34.0)
MCHC: 33.6 g/dL (ref 30.0–36.0)
MCV: 92.8 fL (ref 78.0–100.0)
Platelets: 211 10*3/uL (ref 150–400)
RBC: 5.03 MIL/uL (ref 4.22–5.81)
RDW: 14 % (ref 11.5–15.5)
WBC: 8.9 10*3/uL (ref 4.0–10.5)

## 2016-08-31 LAB — LACTIC ACID, PLASMA
Lactic Acid, Venous: 1.7 mmol/L (ref 0.5–1.9)
Lactic Acid, Venous: 1.5 mmol/L (ref 0.5–1.9)

## 2016-08-31 MED ORDER — DIVALPROEX SODIUM ER 500 MG PO TB24
500.0000 mg | ORAL_TABLET | Freq: Every day | ORAL | Status: DC
  Filled 2016-08-31: qty 1

## 2016-08-31 MED ORDER — SODIUM CHLORIDE 0.9 % IV BOLUS (SEPSIS)
250.0000 mL | Freq: Once | INTRAVENOUS | Status: AC
  Administered 2016-09-01: 250 mL via INTRAVENOUS

## 2016-08-31 MED ORDER — SODIUM CHLORIDE 0.9 % IV SOLN
INTRAVENOUS | Status: DC
  Administered 2016-09-01: 01:00:00 via INTRAVENOUS

## 2016-08-31 NOTE — Clinical Social Work Note (Signed)
Clinical Social Work Assessment  Patient Details  Name: Adrian Melendez MRN: [id number] Date of Birth: 05-26-1935  Date of referral:  08/31/16               Reason for consult:  Facility Placement                Permission sought to share information with:  Chartered certified accountant granted to share information::  No (disoriented)  Name::     Advertising account planner::  SNFs  Relationship::  dtr  Contact Information:     Housing/Transportation Living arrangements for the past 2 months:  Single Family Home Source of Information:  Adult Children Patient Interpreter Needed:  None Criminal Activity/Legal Involvement Pertinent to Current Situation/Hospitalization:  No - Comment as needed Significant Relationships:  Adult Children, Spouse Lives with:  Spouse Do you feel safe going back to the place where you live?    Need for family participation in patient care:  Yes (Comment) (currently needing help with decision making)  Care giving concerns: Pt lives at home with wife- unsure if patient will be strong enough to return home when medically cleared to leave the hospital.   Social Worker assessment / plan:  CSW met with patient dtr to discuss plan- patient asleep and did not rouse during conversation.  CSW discussed PT recommendation for CIR and discussed SNF as alternative option if CIR unable to admit- explained SNF and SNF referral process.  Employment status:  Retired Nurse, adult PT Recommendations:  [place] / Referral to community resources:  [place]  Patient/Family's Response to care:  Patient dtr is agreeable to SNF search but has strong preference for CIR or to take patient home if he is able- does not think the patient would agree to SNF stay.  Patient/Family's Understanding of and Emotional Response to Diagnosis, Current Treatment, and Prognosis:  Patient dtr very involved and knowledgeable of  patients condition- hopeful he will recover quickly and be capable of participating in CIR.  Emotional Assessment Appearance:  Appears stated age Attitude/Demeanor/Rapport:  Unable to Assess Affect (typically observed):  Unable to Assess Orientation:  Oriented to Self, Oriented to Place Alcohol / Substance use:  Not Applicable Psych involvement (Current and /or in the community):  No (Comment)  Discharge Needs  Concerns to be addressed:  Care Coordination Readmission within the last 30 days:  No Current discharge risk:  Physical Impairment Barriers to Discharge:  Continued Medical Work up   Jorge Ny, LCSW 08/31/2016, 3:56 PM

## 2016-08-31 NOTE — Progress Notes (Signed)
Pt has been on CPAP since this am after seen by Dr Jomarie LongsJoseph, taken off for oral care. Pt has remained lethargic throughout day, arouses slightly when stimulated or mask taken off. RT has been asked to assess pt off and on today. Pt temp 99.7 axillary, resp up 28-30, sats maintained 90-95% on RA with CPAP. Congested in upper airway, attempted to suction out without success, deeper than yaunkers suction can reach.   Dr Jomarie LongsJoseph notifiedat this time, orders placed, at 1740 foley d/c'd after UA/CX obtained, plan for PCXR, lactic acid level,---daughter informed of orders and plan of care.

## 2016-08-31 NOTE — Progress Notes (Signed)
SLP Cancellation Note  Patient Details Name: Adrian Melendez MRN: [id number] DOB: 08/21/1935   Cancelled treatment:       Reason Eval/Treat Not Completed: Medical issues which prohibited therapy Nursing advised that pt recently placed on CPAP and is sleeping. Daughter wishes for pt to sleep at present time.   Adrian Melendez, M.S., CCC-SLP Speech-Language Pathologist   Adrian Melendez 08/31/2016, 11:28 AM

## 2016-08-31 NOTE — [place] FL2 (Signed)
[place] MEDICAID FL2 LEVEL OF CARE SCREENING TOOL     IDENTIFICATION  Patient Name: Adrian Melendez Birthdate: 12/08/1934 Sex: male Admission Date (Current Location): 09/08/2016  [facility] [place]   Facility and Address:  [facility] [place], [address], [place], [place] [address]      Provider Number: [id number]  Attending Physician Name and Address:  Zannie CovePreetha Joseph, MD  Relative Name and Phone Number:       Current Level of Care: Hospital Recommended Level of Care: Skilled Nursing Facility Prior Approval Number:    Date Approved/Denied:   PASRR Number: [id number]  Discharge Plan: SNF    Current Diagnoses: Patient Active Problem List   Diagnosis Date Noted  . Cerebral thrombosis with cerebral infarction 08/29/2016  . Cerebral embolism with cerebral infarction 08/29/2016  . Subarachnoid hemorrhage 08/29/2016  . Intracerebral hemorrhage 08/29/2016  . Pressure injury of skin 08/29/2016  . Acute encephalopathy May 13, 2016  . Generalized weakness May 13, 2016  . UTI (urinary tract infection) May 13, 2016  . Urinary retention May 13, 2016  . Cerebrovascular accident (CVA) due to embolism of cerebral artery (HCC) 03/29/2016  . 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 Paroxysmal atrial fibrillation (HCC) 03/29/2016  . Intracranial vascular stenosis 03/29/2016  . Anticoagulant long-term use 03/29/2016  . Essential hypertension 03/29/2016  . Ocular migraine 03/29/2016  . Orthostatic hypotension   . HLD (hyperlipidemia) 01/12/2016  . Hypokalemia 01/12/2016  . TIA (transient ischemic attack) 01/12/2016  . CKD (chronic kidney disease)   . Stroke (HCC)   . Migraine with aura and without status migrainosus, not intractable   . Cerebrovascular accident (CVA) due to bilateral thrombosis of posterior cerebral arteries (HCC)   . Stroke (cerebrum) (HCC) 01/06/2016  . CVA (cerebral infarction) 01/06/2016   . Acute CVA (cerebrovascular accident) (HCC) 01/06/2016  . PAF (paroxysmal atrial fibrillation) by Holter June 2012 05/28/2013  . Chronic anticoagulation- Xarelto 05/28/2013  . Dyslipidemia 05/28/2013  . Dizziness associated with nausea june 2013- suspected to be secondary to PAF with pauses 02/29/2012  . Syncope, near- June 2013 02/29/2012  . HTN (hypertension) 02/29/2012  . Hx-TIA (transient ischemic attack) June 2008 02/29/2012    Orientation RESPIRATION BLADDER Height & Weight     Self, Place  Other (Comment) (CPAP at night- has home CPAP) Indwelling catheter Weight: 203 lb 12.8 oz (92.4 kg) Height:  6' (182.9 cm)  BEHAVIORAL SYMPTOMS/MOOD NEUROLOGICAL BOWEL NUTRITION STATUS      Incontinent Diet (see DC summary)  AMBULATORY STATUS COMMUNICATION OF NEEDS Skin   Extensive Assist Verbally PU Stage and Appropriate Care PU Stage 1 Dressing:  (sacrum)                     Personal Care Assistance Level of Assistance  Bathing, Dressing Bathing Assistance: Maximum assistance   Dressing Assistance: Maximum assistance     Functional Limitations Info             SPECIAL CARE FACTORS FREQUENCY  PT (By licensed PT), OT (By licensed OT)     PT Frequency: 5/wk OT Frequency: 5/wk            Contractures      Additional Factors Info  Code Status, Allergies, Psychotropic Code Status Info: FULL Allergies Info: Statins Psychotropic Info: zoloft         Current Medications (08/31/2016):  This is the current hospital active medication list Current Facility-Administered Medications  Medication Dose Route Frequency Provider  Last Rate Last Dose  . acetaminophen (TYLENOL) tablet 650 mg  650 mg Oral Q6H PRN Gwenyth BenderKaren M Black, NP       Or  . acetaminophen (TYLENOL) suppository 650 mg  650 mg Rectal Q6H PRN Gwenyth BenderKaren M Black, NP      . allopurinol (ZYLOPRIM) tablet 450 mg  450 mg Oral Daily Gwenyth BenderKaren M Black, NP   450 mg at 08/28/16 1036  . clopidogrel (PLAVIX) tablet 75 mg  75 mg  Oral Daily Layne BentonSharon L Biby, NP      . divalproex (DEPAKOTE ER) 24 hr tablet 500 mg  500 mg Oral QHS Zannie CovePreetha Joseph, MD      . hydrALAZINE (APRESOLINE) injection 10 mg  10 mg Intravenous Q4H PRN Leda GauzeKaren J Kirby-Graham, NP   10 mg at 08/31/16 0455  . hydrALAZINE (APRESOLINE) injection 10 mg  10 mg Intravenous Once Leda GauzeKaren J Kirby-Graham, NP   Stopped at 08/30/16 2315  . MEDLINE mouth rinse  15 mL Mouth Rinse BID Belkys A Regalado, MD   15 mL at 08/30/16 2127  . ondansetron (ZOFRAN) tablet 4 mg  4 mg Oral Q6H PRN Gwenyth BenderKaren M Black, NP       Or  . ondansetron [facility](ZOFRAN) injection 4 mg  4 mg Intravenous Q6H PRN Lesle ChrisKaren M Black, NP      . pneumococcal 23 valent vaccine (PNU-IMMUNE) injection 0.5 mL  0.5 mL Intramuscular Tomorrow-1000 Belkys A Regalado, MD      . polyethylene glycol (MIRALAX / GLYCOLAX) packet 17 g  17 g Oral Daily Belkys A Regalado, MD   17 g at 08/28/16 1036  . polyvinyl alcohol (LIQUIFILM TEARS) 1.4 % ophthalmic solution 1 drop  1 drop Both Eyes PRN Belkys A Regalado, MD   1 drop at 08/30/16 1341  . RESOURCE THICKENUP CLEAR   Oral PRN Belkys A Regalado, MD      . rivaroxaban (XARELTO) tablet 20 mg  20 mg Oral Q supper Gwenyth BenderKaren M Black, NP   20 mg at 08/30/16 2121  . rosuvastatin (CRESTOR) tablet 20 mg  20 mg Oral q1800 Layne BentonSharon L Biby, NP      . saccharomyces boulardii (FLORASTOR) capsule 250 mg  250 mg Oral BID Belkys A Regalado, MD   250 mg at 08/30/16 2122  . sertraline (ZOLOFT) tablet 25 mg  25 mg Oral QHS Belkys A Regalado, MD   25 mg at 08/30/16 2200     Discharge Medications: Please see discharge summary for a list of discharge medications.  Relevant Imaging Results:  Relevant Lab Results:   Additional Information SS#: [id number]  Burna SisUris, [place] Hewins H, LCSW

## 2016-08-31 NOTE — Progress Notes (Signed)
PROGRESS NOTE    Adrian DrownWilliam R Melendez  RUE:[id number] DOB: 10/25/1934 DOA: 08/21/2016 PCP: Garlan FillersPATERSON,DANIEL G, MD    Brief Narrative: Adrian DrownWilliam R Melendez is a 81 y.o. male with a Past Medical History  migraines, high cholesterol, gout, CHF, CKD, atrial flutter and stroke who presented with altered mental status. Found to have urinary tact infection. Likely due to urinary retention. Patient has had long-standing history of symptoms of urinary retention. Patient was stared on IV antibiotics,  He didn't improved with treatment of infection. Neurologist consulted and MRI was ordered. MRI consistent with multiple strokes.  Then 12/12 patient had an episode of unresponsiveness when he was getting out of the bed to bedside commode. His eyes rolled  back, and he was not responsive per daughter.   Assessment & Plan:   Acute/Sub acute stroke;  -MRI positive for several small foci of diffusion restriction within pons, bilateral occipital lobes, scattered in right posterior temporal lobe, and in the right thalamus compatible with acute/ early subacute infarction. -despite compliance with xarelto for Afib -SLp eval ongoing, NPO pending repeat swallow eval -continue Xarelto -ECHo with normal EF and wall motion, carotid duplex unremarkable -Pt eval completed, CIR recommended -NEuro following  Syncope;  Patient had unresponsive syncopal episode on 12/12, no events on tele -long history of orthostatic hypotension and just recently started on Flomax inpatient which likely contributed -Stopped Flomax -Follow-up 2-D echocardiogram -EEG without seizure activity  Acute encephalopathy -Suspect related to stroke compounded by delirium, Depakote and non compliance with CPAP -Hold sedating meds, low-dose Xanax which he only takes  PRN and had stopped that 2 weeks ago anyway per daughter -RPR negative. Ammonia 26.m, B 12 normal.  -Frequent orientation, OOB to chair etc -advised daughter and RN to ensure that he  wears the CPAP at night, change depakote to QHS  Urine retention,  -Received 4 days of IV antibiotics.  -Renal US; No hydronephrosis. The bladder is completely decompressed by Foley catheter.  -urine culture with no growth. -stopped antibiotics.  -Voiding trial when able to ambulate better, suspect will need Foley at discharge to Rehab  Hypokalemia; repleted IV .   A fib; on xarelto.  -stable now  HTN; Hold Norvasc, due to syncope and acute CVA  DVT prophylaxis: on xarelto  Code Status: Full code.  Family Communication: daughter at bedside.  Disposition Plan: CIR when improved  Consultants:   none   Procedures: none   Antimicrobials: ceftriaxone 12-09   Subjective: Slept last pm, didn't use CPAP again   Objective: Vitals:   08/30/16 2300 08/31/16 0407 08/31/16 0754 08/31/16 1100  BP: (!) 186/80 (!) 187/94 (!) 167/97 (!) 152/84  Pulse: 64 78 85 85  Resp: 20 13 14  (!) 23  Temp: 98.3 F (36.8 C) 98.5 F (36.9 C) 98 F (36.7 C)   TempSrc: Axillary Axillary Oral   SpO2: 93% 93% 95% 91%  Weight:      Height:        Intake/Output Summary (Last 24 hours) at 08/31/16 1350 Last data filed at 08/31/16 0409  Gross per 24 hour  Intake                0 ml  Output              300 ml  Net             -300 ml   Filed Weights   09/17/2016 1413 08/29/16 1154  Weight: 93.3 kg (205 lb 9.6 oz) 92.4 kg (  203 lb 12.8 oz)    Examination:  General exam: somnolent, arousable, no distress Respiratory system: decreased BS at bases Cardiovascular system: S1 & S2 heard, RRR. No JVD, murmurs, rubs, gallops or clicks. No pedal edema. Gastrointestinal system: Abdomen is nondistended, soft and nontender.Normal bowel sounds heard. Central nervous system:  No focal neurological deficits, somnolent. Extremities: Symmetric 5 x 5 power. Skin: No rashes, lesions or ulcers  Data Reviewed: I have personally reviewed following labs and imaging studies  CBC:  Recent Labs Lab  August 29, 2016 0930 08/29/2016 1242 08/26/16 0520 08/27/16 0011 08/29/16 0543 08/31/16 0203  WBC 11.5*  --  9.6 9.2 8.4 8.9  NEUTROABS 9.5*  --   --   --   --   --   HGB 15.0  --  13.6 13.7 14.6 15.7  HCT 44.1 42.9 40.7 40.3 42.6 46.7  MCV 91.5  --  92.1 92.6 91.4 92.8  PLT 198  --  174 169 199 211   Basic Metabolic Panel:  Recent Labs Lab 29-Aug-2016 0931 08/26/16 0520 08/27/16 0435 08/28/16 0617 08/29/16 0543 08/31/16 0203  NA 140 142 140 140 139 143  K 2.8* 3.4* 3.4* 3.6 3.3* 3.4*  CL 104 107 103 104 102 106  CO2 23 24 28 25 24 23   GLUCOSE 125* 107* 99 108* 115* 128*  BUN 24* 18 16 19  22* 28*  CREATININE 1.31* 1.01 1.05 1.10 1.09 1.10  CALCIUM 9.0 8.7* 8.6* 8.8* 8.9 9.0  MG 1.7  --   --   --   --   --    GFR: Estimated Creatinine Clearance: 59.8 mL/min (by C-G formula based on SCr of 1.1 mg/dL). Liver Function Tests:  Recent Labs Lab 2016-08-29 0931  AST 31  ALT 27  ALKPHOS 114  BILITOT 1.1  PROT 6.7  ALBUMIN 4.1   No results for input(s): LIPASE, AMYLASE in the last 168 hours.  Recent Labs Lab 2016-08-29 1120  AMMONIA 26   Coagulation Profile:  Recent Labs Lab 08/29/16 1120 08/27/16 0011  INR 1.31 2.32   Cardiac Enzymes:  Recent Labs Lab 08/29/16 1029 08/29/16 1656 08/29/16 2131  TROPONINI 0.03* <0.03 0.03*   BNP (last 3 results) No results for input(s): PROBNP in the last 8760 hours. HbA1C:  Recent Labs  08/29/16 1656  HGBA1C 6.0*   CBG:  Recent Labs Lab August 29, 2016 0845 08/28/16 1525  GLUCAP 141* 118*   Lipid Profile:  Recent Labs  08/30/16 0223  CHOL 142  HDL 44  LDLCALC 84  TRIG 70  CHOLHDL 3.2   Thyroid Function Tests: No results for input(s): TSH, T4TOTAL, FREET4, T3FREE, THYROIDAB in the last 72 hours. Anemia Panel: No results for input(s): VITAMINB12, FOLATE, FERRITIN, TIBC, IRON, RETICCTPCT in the last 72 hours. Sepsis Labs: No results for input(s): PROCALCITON, LATICACIDVEN in the last 168 hours.  Recent Results  (from the past 240 hour(s))  Urine culture     Status: None   Collection Time: August 29, 2016  9:42 AM  Result Value Ref Range Status   Specimen Description URINE, RANDOM  Final   Special Requests NONE  Final   Culture NO GROWTH  Final   Report Status 08/26/2016 FINAL  Final  MRSA PCR Screening     Status: None   Collection Time: 08/29/16 12:03 PM  Result Value Ref Range Status   MRSA by PCR NEGATIVE NEGATIVE Final    Comment:        The GeneXpert MRSA Assay (FDA approved for NASAL specimens only),  is one component of a comprehensive MRSA colonization surveillance program. It is not intended to diagnose MRSA infection nor to guide or monitor treatment for MRSA infections.          Radiology Studies: Dg Swallowing Func-speech Pathology  Result Date: 08/30/2016 Objective Swallowing Evaluation: Type of Study: MBS-Modified Barium Swallow Study Patient Details Name: Adrian DrownWilliam R Melendez MRN: [id number] Date of Birth: 04/19/1935 Today's Date: 08/30/2016 Time: SLP Start Time (ACUTE ONLY): 0906-SLP Stop Time (ACUTE ONLY): 0935 SLP Time Calculation (min) (ACUTE ONLY): 29 min Past Medical History: Past Medical History: Diagnosis Date . Atrial flutter (HCC)   a. On xarelto . CKD (chronic kidney disease), stage III  . Colon polyps   colonoscopy 2006 . Diastolic dysfunction  . Dizziness 02/29/2012 . Gout  . Gout  . Headache  . History of nuclear stress test 03/28/2012  lexiscan myoview; negative for ischemia . Hx of migraines  . Hx-TIA (transient ischemic attack) June 2008 . Hyperglycemia  . Hyperlipidemia   statin intolerance . Hyperlipidemia  . Hypertension  . Hypokalemia  . Migraine aura without headache  . Migraines   classic ophthalmic . Stroke (HCC)  . Syncope, near 02/29/2012  a. Resulting in discontinuation of beta blocker. . Urinary retention  Past Surgical History: Past Surgical History: Procedure Laterality Date . COLONOSCOPY    2006,11/12 . EYE SURGERY    laser surgery to seal vein  .  TRANSTHORACIC ECHOCARDIOGRAM  03/01/2012  EF 55-60% with normal systolic function; mild AV regurg; calcified mitral annulus - ordered for syncope HPI: Adrian PummelWilliam R Ethridgeis a 81 y.o.malewith a Past Medical History migraines, high cholesterol, gout, CHF, CKD, atrial flutter and stroke who presents with altered mental status. Found to have urinary tact infection. Likely due to urinary retention. Patient has had long-standing history of symptoms of urinary retention. Patient was stared on IV antibiotics, He didn't improved with treatment of infection. Neurologist consulted and MRI was ordered. MRI consistent with multiple stroke. Patient had an episode of unresponsiveness when he was getting out of the bed to bedside commode. His eyes rolled back, and he was not responsive per daughter.  No Data Recorded Assessment / Plan / Recommendation CHL IP CLINICAL IMPRESSIONS 08/30/2016 Therapy Diagnosis Moderate oral phase dysphagia;Moderate pharyngeal phase dysphagia Clinical Impression Pt demonstrates a moderate oral and oropharyngeal dysphagia complicated by lethargy and poor breath support for cough. Pt has brief oral holding of boluses, likely due to lethargy with premature spillage to pharynx. This, combined with delayed swallow initiation leads to silent penetration/aspiration events before the swallow. Pt also has a baseline structural dysphagia due to appearance of bony protrusions on the anterior cervical spine that sometimes direct bolus/residuals directly into the vestibule and can also impede epiglottic deflection if pts effort with swallow is poor. Pt typically swallow twice to fully clear bolus, though often vallecular residuals remain. A chin tuck significantly aids in bolus passage. By the middle of the test however, pt was not able to follow commands and weak volitional cough and stronger reflexive coughing did not mobilize aspirates. Overall, if pt is alert and able to follow instructions for a chin tuck  with solids and small sips of nectar thick liquids via cup, pt could consume POs with relatively low risk. At this time however pt is poorly alert and showing some signs of inability to protect airway by end of session (snoring respirations, gurgling) though SLP was able to suction his throat with improvement. Recommend pt remain NPO until arousal is consistent.  Impact  on safety and function Severe aspiration risk   CHL IP TREATMENT RECOMMENDATION 08/30/2016 Treatment Recommendations Therapy as outlined in treatment plan below   Prognosis 08/30/2016 Prognosis for Safe Diet Advancement Fair Barriers to Reach Goals -- Barriers/Prognosis Comment -- CHL IP DIET RECOMMENDATION 08/30/2016 SLP Diet Recommendations (No Data) Liquid Administration via -- Medication Administration Crushed with puree Compensations Slow rate;Small sips/bites;Chin tuck Postural Changes Seated upright at 90 degrees   CHL IP OTHER RECOMMENDATIONS 08/30/2016 Recommended Consults -- Oral Care Recommendations Oral care QID Other Recommendations Order thickener from pharmacy;Have oral suction available   CHL IP FOLLOW UP RECOMMENDATIONS 08/30/2016 Follow up Recommendations Inpatient Rehab   CHL IP FREQUENCY AND DURATION 08/30/2016 Speech Therapy Frequency (ACUTE ONLY) min 2x/week Treatment Duration 2 weeks      CHL IP ORAL PHASE 08/30/2016 Oral Phase Impaired Oral - Pudding Teaspoon -- Oral - Pudding Cup -- Oral - Honey Teaspoon -- Oral - Honey Cup -- Oral - Nectar Teaspoon -- Oral - Nectar Cup Delayed oral transit;Premature spillage Oral - Nectar Straw Delayed oral transit;Premature spillage Oral - Thin Teaspoon -- Oral - Thin Cup Delayed oral transit;Premature spillage Oral - Thin Straw Delayed oral transit;Premature spillage Oral - Puree Delayed oral transit;Premature spillage Oral - Mech Soft Delayed oral transit;Premature spillage Oral - Regular -- Oral - Multi-Consistency -- Oral - Pill -- Oral Phase - Comment --  CHL IP PHARYNGEAL PHASE  08/30/2016 Pharyngeal Phase Impaired Pharyngeal- Pudding Teaspoon -- Pharyngeal -- Pharyngeal- Pudding Cup -- Pharyngeal -- Pharyngeal- Honey Teaspoon -- Pharyngeal -- Pharyngeal- Honey Cup -- Pharyngeal -- Pharyngeal- Nectar Teaspoon -- Pharyngeal -- Pharyngeal- Nectar Cup Reduced epiglottic inversion;Delayed swallow initiation-pyriform sinuses;Penetration/Aspiration before swallow;Reduced tongue base retraction;Pharyngeal residue - valleculae;Compensatory strategies attempted (with notebox) Pharyngeal Material enters airway, remains ABOVE vocal cords and not ejected out;Material does not enter airway Pharyngeal- Nectar Straw Reduced epiglottic inversion;Delayed swallow initiation-pyriform sinuses;Penetration/Aspiration before swallow;Reduced tongue base retraction;Pharyngeal residue - valleculae;Compensatory strategies attempted (with notebox);Moderate aspiration Pharyngeal Material enters airway, passes BELOW cords without attempt by patient to eject out (silent aspiration);Material enters airway, remains ABOVE vocal cords then ejected out;Material does not enter airway Pharyngeal- Thin Teaspoon -- Pharyngeal -- Pharyngeal- Thin Cup Reduced epiglottic inversion;Delayed swallow initiation-pyriform sinuses;Penetration/Aspiration before swallow;Reduced tongue base retraction;Pharyngeal residue - valleculae;Compensatory strategies attempted (with notebox) Pharyngeal Material enters airway, passes BELOW cords without attempt by patient to eject out (silent aspiration) Pharyngeal- Thin Straw Reduced epiglottic inversion;Delayed swallow initiation-pyriform sinuses;Penetration/Aspiration before swallow;Reduced tongue base retraction;Pharyngeal residue - valleculae;Compensatory strategies attempted (with notebox) Pharyngeal Material enters airway, passes BELOW cords without attempt by patient to eject out (silent aspiration) Pharyngeal- Puree Delayed swallow initiation-vallecula;Reduced epiglottic inversion;Pharyngeal  residue - valleculae Pharyngeal -- Pharyngeal- Mechanical Soft Delayed swallow initiation-vallecula;Reduced epiglottic inversion;Pharyngeal residue - valleculae Pharyngeal -- Pharyngeal- Regular -- Pharyngeal -- Pharyngeal- Multi-consistency -- Pharyngeal -- Pharyngeal- Pill -- Pharyngeal -- Pharyngeal Comment --  No flowsheet data found. No flowsheet data found. Harlon Ditty, [place] CCC-SLP [phone] Claudine Mouton 08/30/2016, 10:37 AM                   Scheduled Meds: . allopurinol  450 mg Oral Daily  . clopidogrel  75 mg Oral Daily  . divalproex  500 mg Oral QHS  . hydrALAZINE  10 mg Intravenous Once  . mouth rinse  15 mL Mouth Rinse BID  . pneumococcal 23 valent vaccine  0.5 mL Intramuscular Tomorrow-1000  . polyethylene glycol  17 g Oral Daily  . rivaroxaban  20 mg Oral Q supper  .  rosuvastatin  20 mg Oral q1800  . saccharomyces boulardii  250 mg Oral BID  . sertraline  25 mg Oral QHS   Continuous Infusions:   LOS: 5 days    Time spent:35 minutes.     Zannie Cove, MD [facility] Pager [phone]  If 7PM-7AM, please contact night-coverage [email or web address] Password [email or web address] 08/31/2016, 1:50 PM

## 2016-08-31 NOTE — Progress Notes (Signed)
PT Cancellation Note  Patient Details Name: Adrian DrownWilliam R Rood MRN: [id number] DOB: 12/05/1934   Cancelled Treatment:    Reason Eval/Treat Not Completed: Fatigue/lethargy limiting ability to participate. Pt resting with CPAP on when PT entered room. [name] granddaughter requested that pt continue to rest. PT will continue to f/u with pt as appropriate.   Alessandra BevelsJennifer M [place] Folden 08/31/2016, 3:14 PM Deborah ChalkJennifer Pablo Stauffer, PT, DPT [phone]

## 2016-08-31 NOTE — Consult Note (Signed)
Physical Medicine and Rehabilitation Consult   Reason for Consult: Dysphagia, confusion, lethargy with generalized weakness due to multiple strokes.  Referring Physician: Dr. Jomarie Longs   HPI: Adrian Melendez is a 81 y.o. 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  CT head revealed stable ignificant atrophy and small vessel disease. MS changes felt to be due to UTI and urinary retention--he was started on antibiotics but continued to have issues with lethargy.   MRI brain done 12/12 revealing several small foci of diffusion restriction within pons, bilateral occipital lobes, scattered in right posterior temporal lobe and right thalamus compatible with acute/early subacute infarction. Neurology felt that stroke embolic from [facility] despite compliance with Xarelto/ASA and questioned changing to different anticoagulation. Family has elected on staying the course at this time.   He was started on diet but made NPO yesterday due to recurrent lethargy with delay in swallow and exacerbation of baseline dysphagia. Follow up carotid dopplers without change. He has had recurrent syncopal episodes last with attempts at PT evaluation. CIR recommended by rehab team.   Daughter in room reporting that patient has steadily getting worse during this hospitalization with significant decline in the past 2 days that has been reported to be due to CO2 retention. At baseline he lives with his wife who is in good health and can assist after discharge. He was independent and did not have any deficits or need any therapy after CVA earlier this year.    Per Daughter:  Review of Systems  Unable  to perform ROS: Patient unresponsive  Eyes: Positive for blurred vision.  Gastrointestinal: Positive for heartburn.  Genitourinary:       Nocturia--decrease with CPAP compliance.   Musculoskeletal: Positive for back pain.  Psychiatric/Behavioral: The patient does not have insomnia.       Past Medical History:  Diagnosis Date  . Atrial flutter (HCC)    a. On xarelto  . CKD (chronic kidney disease), stage III   . Colon polyps    colonoscopy 2006  . Diastolic dysfunction   . Dizziness 02/29/2012  . Gout   . Gout   . Headache   . History of nuclear stress test 03/28/2012   lexiscan myoview; negative for ischemia  . Hx of migraines   . Hx-TIA (transient ischemic attack) June 2008  . Hyperglycemia   . Hyperlipidemia    statin intolerance  . Hyperlipidemia   . Hypertension   . Hypokalemia   . Migraine aura without headache   . Migraines    classic ophthalmic  . Stroke (HCC)   . Syncope, near 02/29/2012   a. Resulting in discontinuation of beta blocker.  . Urinary retention     Past Surgical History:  Procedure Laterality Date  . COLONOSCOPY     2006,11/12  . EYE SURGERY     laser surgery to seal vein   . TRANSTHORACIC ECHOCARDIOGRAM  03/01/2012   EF 55-60% with normal systolic function; mild AV regurg; calcified mitral annulus - ordered for syncope    Family History  Problem Relation Age of Onset  . Cancer Maternal Grandmother   . Heart disease Maternal Grandfather   . 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  Independent without AD PTA.   Per reports that he has never smoked. He has never used smokeless tobacco. Per reports that he does not drink alcohol or use drugs.    Allergies  Allergen Reactions  . Statins     May have caused leg weakness in the past, but willing to try again (08/30/16)     Medications Prior to Admission  Medication Sig Dispense Refill  . allopurinol (ZYLOPRIM) 300 MG tablet Take 450 mg by  mouth daily. Take one and a half tablets (450mg ) daily    . amLODipine (NORVASC) 5 MG tablet Take 1 tablet (5 mg total) by mouth daily. 90 tablet 3  . aspirin 81 MG tablet Take 81 mg by mouth daily.    . Cholecalciferol (VITAMIN D3) 1000 units CAPS Take 1,000 Units by mouth daily.    . Cyanocobalamin (VITAMIN B-12) 1000 MCG SUBL Place 1,000 mcg under the tongue daily.    . divalproex (DEPAKOTE ER) 500 MG 24 hr tablet TAKE 1 TABLET EVERY DAY 30 tablet 5  . sertraline (ZOLOFT) 25 MG tablet Take 25 mg by mouth daily.    [name] Ubiquinol 100 MG CAPS Take 200 mg by mouth daily.     [name] 20 MG TABS tablet TAKE 1 TABLET BY MOUTH DAILY WITH SUPPER 30 tablet 0    Home: Home Living Family/patient expects to be discharged to:: Private residence Living Arrangements: Spouse/significant other Available Help at Discharge: Family, Available 24 hours/day Type of Home: House Home Access: Stairs to enter [employer] of Steps: 4 Entrance Stairs-Rails: Left, Right, Can reach both Home Layout: One level Bathroom Shower/Tub: Health visitor: Standard Home Equipment: Environmental consultant - 2 wheels, [employer] - single point, Wheelchair - manual, Banker History: Prior Function Level of Independence: Independent Functional Status:  Mobility: Bed Mobility Overal bed mobility: Needs Assistance Bed Mobility: Supine to Sit, Sit to Supine Supine to sit: Mod assist, HOB elevated Sit to supine: Max assist, +2 for physical assistance General bed mobility comments: pt required increased time, use of bed rails, verbal and tactile cues for sequencing and mod A at trunk to achieve sitting EOB with use of bed pad to position hips. Pt also required max A x2 (at trunk and bilateral LEs) to return to supine from sitting Transfers Overall transfer level: Needs assistance Equipment used: Rolling walker (2 wheeled) Transfers: Sit to/from Stand Sit to Stand: Mod assist, +2 safety/equipment Stand  pivot transfers: Min assist General transfer comment: pt required increased time, VC'ing for hand placement and sequencing. Despite max VC'ing, pt continued to use bilateral hands on RW to pull up into standing. Mod a required to achieve full standing from bed. Ambulation/Gait Ambulation/Gait assistance: Min assist Ambulation Distance (Feet): 15 Feet (x2) Assistive device: Rolling walker (2 wheeled) Gait Pattern/deviations: Step-through pattern, Decreased stride length General Gait Details: did not occur secondary to lethargy and unresponsive episode in standing Gait velocity: decreased Gait velocity interpretation: Below normal speed for age/gender    ADL:    Cognition: Cognition Overall Cognitive Status: Impaired/Different from baseline Orientation Level: Oriented to person, Disoriented to time, Disoriented to situation, Oriented to place Cognition Arousal/Alertness: Lethargic Behavior During Therapy: Flat affect Overall Cognitive Status: Impaired/Different from baseline Area of Impairment: Orientation, Attention, Memory, Following commands, Safety/judgement, Awareness, Problem solving Orientation Level: Disoriented to, Time, Situation Current Attention Level: Selective Memory: Decreased short-term memory Following Commands: Follows one step commands with increased time, Follows multi-step commands inconsistently Safety/Judgement: Decreased awareness  of safety, Decreased awareness of deficits Problem Solving: Slow processing, Decreased initiation, Requires verbal cues, Requires tactile cues General Comments: pt's daughter was present throughout session and reported that this was very different from pt's baseline   Blood pressure (!) 167/97, pulse 85, temperature 98 F (36.7 C), temperature source Oral, resp. rate 14, height 6' (1.829 m), weight 92.4 kg (203 lb 12.8 oz), SpO2 95 %. Physical Exam  Nursing note and vitals reviewed. Constitutional: He appears well-developed and  well-nourished. He appears lethargic. He has a sickly appearance. Face mask in place.  Somnolent with sonorous/wet sounds. CPAP on face. Patient picking in air and at tubing.  HENT:  Head: Normocephalic and atraumatic.  Cardiovascular: Normal rate.  An irregular rhythm present.  Respiratory: Effort normal. He has wheezes. He has rales.  Coarse upper airway sounds due to oral secretions. CPAP on  GI: Soft. Bowel sounds are normal. He exhibits no distension. There is no tenderness.  Musculoskeletal: He exhibits no edema.  Neurological: He appears lethargic.  Minimally awake--able to move BLE and grip but resisted attempts at BUE tesing. Non verbal due to somnolence.   Skin: Skin is warm and dry.    Results for orders placed or performed during the hospital encounter of 2016/09/18 (from the past 24 hour(s))  CBC     Status: None   Collection Time: 08/31/16  2:03 AM  Result Value Ref Range   WBC 8.9 4.0 - 10.5 K/uL   RBC 5.03 4.22 - 5.81 MIL/uL   Hemoglobin 15.7 13.0 - 17.0 g/dL   HCT 40.9 81.1 - 91.4 %   MCV 92.8 78.0 - 100.0 fL   MCH 31.2 26.0 - 34.0 pg   MCHC 33.6 30.0 - 36.0 g/dL   RDW 78.2 95.6 - 21.3 %   Platelets 211 150 - 400 K/uL  Basic metabolic panel     Status: Abnormal   Collection Time: 08/31/16  2:03 AM  Result Value Ref Range   Sodium 143 135 - 145 mmol/L   Potassium 3.4 (L) 3.5 - 5.1 mmol/L   Chloride 106 101 - 111 mmol/L   CO2 23 22 - 32 mmol/L   Glucose, Bld 128 (H) 65 - 99 mg/dL   BUN 28 (H) 6 - 20 mg/dL   Creatinine, Ser 0.86 0.61 - 1.24 mg/dL   Calcium 9.0 8.9 - 57.8 mg/dL   GFR calc non Af Amer >60 >60 mL/min   GFR calc Af Amer >60 >60 mL/min   Anion gap 14 5 - 15   Dg Swallowing Func-speech Pathology  Result Date: 08/30/2016 Objective Swallowing Evaluation: Type of Study: MBS-Modified Barium Swallow Study Patient Details Name: MARKEESE BOYAJIAN MRN: [id number] Date of Birth: 11-Feb-1935 Today's Date: 08/30/2016 Time: SLP Start Time (ACUTE ONLY): 0906-SLP  Stop Time (ACUTE ONLY): 0935 SLP Time Calculation (min) (ACUTE ONLY): 29 min Past Medical History: Past Medical History: Diagnosis Date . Atrial flutter (HCC)   a. On xarelto . CKD (chronic kidney disease), stage III  . Colon polyps   colonoscopy 2006 . Diastolic dysfunction  . Dizziness 02/29/2012 . Gout  . Gout  . Headache  . History of nuclear stress test 03/28/2012  lexiscan myoview; negative for ischemia . Hx of migraines  . Hx-TIA (transient ischemic attack) June 2008 . Hyperglycemia  . Hyperlipidemia   statin intolerance . Hyperlipidemia  . Hypertension  . Hypokalemia  . Migraine aura without headache  . Migraines   classic ophthalmic . Stroke (HCC)  . Syncope, near 02/29/2012  a. Resulting in discontinuation of beta blocker. . Urinary retention  Past Surgical History: Past Surgical History: Procedure Laterality Date . COLONOSCOPY    2006,11/12 . EYE SURGERY    laser surgery to seal vein  . TRANSTHORACIC ECHOCARDIOGRAM  03/01/2012  EF 55-60% with normal systolic function; mild AV regurg; calcified mitral annulus - ordered for syncope HPI: Tawni PummelWilliam R Ethridgeis a 81 y.o.malewith a Past Medical History migraines, high cholesterol, gout, CHF, CKD, atrial flutter and stroke who presents with altered mental status. Found to have urinary tact infection. Likely due to urinary retention. Patient has had long-standing history of symptoms of urinary retention. Patient was stared on IV antibiotics, He didn't improved with treatment of infection. Neurologist consulted and MRI was ordered. MRI consistent with multiple stroke. Patient had an episode of unresponsiveness when he was getting out of the bed to bedside commode. His eyes rolled back, and he was not responsive per daughter.  No Data Recorded Assessment / Plan / Recommendation CHL IP CLINICAL IMPRESSIONS 08/30/2016 Therapy Diagnosis Moderate oral phase dysphagia;Moderate pharyngeal phase dysphagia Clinical Impression Pt demonstrates a moderate oral and  oropharyngeal dysphagia complicated by lethargy and poor breath support for cough. Pt has brief oral holding of boluses, likely due to lethargy with premature spillage to pharynx. This, combined with delayed swallow initiation leads to silent penetration/aspiration events before the swallow. Pt also has a baseline structural dysphagia due to appearance of bony protrusions on the anterior cervical spine that sometimes direct bolus/residuals directly into the vestibule and can also impede epiglottic deflection if pts effort with swallow is poor. Pt typically swallow twice to fully clear bolus, though often vallecular residuals remain. A chin tuck significantly aids in bolus passage. By the middle of the test however, pt was not able to follow commands and weak volitional cough and stronger reflexive coughing did not mobilize aspirates. Overall, if pt is alert and able to follow instructions for a chin tuck with solids and small sips of nectar thick liquids via cup, pt could consume POs with relatively low risk. At this time however pt is poorly alert and showing some signs of inability to protect airway by end of session (snoring respirations, gurgling) though SLP was able to suction his throat with improvement. Recommend pt remain NPO until arousal is consistent.  Impact on safety and function Severe aspiration risk   CHL IP TREATMENT RECOMMENDATION 08/30/2016 Treatment Recommendations Therapy as outlined in treatment plan below   Prognosis 08/30/2016 Prognosis for Safe Diet Advancement Fair Barriers to Reach Goals -- Barriers/Prognosis Comment -- CHL IP DIET RECOMMENDATION 08/30/2016 SLP Diet Recommendations (No Data) Liquid Administration via -- Medication Administration Crushed with puree Compensations Slow rate;Small sips/bites;Chin tuck Postural Changes Seated upright at 90 degrees   CHL IP OTHER RECOMMENDATIONS 08/30/2016 Recommended Consults -- Oral Care Recommendations Oral care QID Other Recommendations  Order thickener from pharmacy;Have oral suction available   CHL IP FOLLOW UP RECOMMENDATIONS 08/30/2016 Follow up Recommendations Inpatient Rehab   CHL IP FREQUENCY AND DURATION 08/30/2016 Speech Therapy Frequency (ACUTE ONLY) min 2x/week Treatment Duration 2 weeks      CHL IP ORAL PHASE 08/30/2016 Oral Phase Impaired Oral - Pudding Teaspoon -- Oral - Pudding Cup -- Oral - Honey Teaspoon -- Oral - Honey Cup -- Oral - Nectar Teaspoon -- Oral - Nectar Cup Delayed oral transit;Premature spillage Oral - Nectar Straw Delayed oral transit;Premature spillage Oral - Thin Teaspoon -- Oral - Thin Cup Delayed oral transit;Premature spillage Oral - Thin Straw Delayed oral transit;Premature  spillage Oral - Puree Delayed oral transit;Premature spillage Oral - Mech Soft Delayed oral transit;Premature spillage Oral - Regular -- Oral - Multi-Consistency -- Oral - Pill -- Oral Phase - Comment --  CHL IP PHARYNGEAL PHASE 08/30/2016 Pharyngeal Phase Impaired Pharyngeal- Pudding Teaspoon -- Pharyngeal -- Pharyngeal- Pudding Cup -- Pharyngeal -- Pharyngeal- Honey Teaspoon -- Pharyngeal -- Pharyngeal- Honey Cup -- Pharyngeal -- Pharyngeal- Nectar Teaspoon -- Pharyngeal -- Pharyngeal- Nectar Cup Reduced epiglottic inversion;Delayed swallow initiation-pyriform sinuses;Penetration/Aspiration before swallow;Reduced tongue base retraction;Pharyngeal residue - valleculae;Compensatory strategies attempted (with notebox) Pharyngeal Material enters airway, remains ABOVE vocal cords and not ejected out;Material does not enter airway Pharyngeal- Nectar Straw Reduced epiglottic inversion;Delayed swallow initiation-pyriform sinuses;Penetration/Aspiration before swallow;Reduced tongue base retraction;Pharyngeal residue - valleculae;Compensatory strategies attempted (with notebox);Moderate aspiration Pharyngeal Material enters airway, passes BELOW cords without attempt by patient to eject out (silent aspiration);Material enters airway, remains ABOVE  vocal cords then ejected out;Material does not enter airway Pharyngeal- Thin Teaspoon -- Pharyngeal -- Pharyngeal- Thin Cup Reduced epiglottic inversion;Delayed swallow initiation-pyriform sinuses;Penetration/Aspiration before swallow;Reduced tongue base retraction;Pharyngeal residue - valleculae;Compensatory strategies attempted (with notebox) Pharyngeal Material enters airway, passes BELOW cords without attempt by patient to eject out (silent aspiration) Pharyngeal- Thin Straw Reduced epiglottic inversion;Delayed swallow initiation-pyriform sinuses;Penetration/Aspiration before swallow;Reduced tongue base retraction;Pharyngeal residue - valleculae;Compensatory strategies attempted (with notebox) Pharyngeal Material enters airway, passes BELOW cords without attempt by patient to eject out (silent aspiration) Pharyngeal- Puree Delayed swallow initiation-vallecula;Reduced epiglottic inversion;Pharyngeal residue - valleculae Pharyngeal -- Pharyngeal- Mechanical Soft Delayed swallow initiation-vallecula;Reduced epiglottic inversion;Pharyngeal residue - valleculae Pharyngeal -- Pharyngeal- Regular -- Pharyngeal -- Pharyngeal- Multi-consistency -- Pharyngeal -- Pharyngeal- Pill -- Pharyngeal -- Pharyngeal Comment --  No flowsheet data found. No flowsheet data found. Harlon DittyBonnie DeBlois, [place] CCC-SLP [phone] Claudine MoutonDeBlois, Bonnie Caroline 08/30/2016, 10:37 AM               Assessment/Plan: Diagnosis: Multiple, embolic posterior circulation strokes 1. Does the need for close, 24 hr/day medical supervision in concert with the patient's rehab needs make it unreasonable for this patient to be served in a less intensive setting? Yes and Potentially 2. Co-Morbidities requiring supervision/potential complications: htn, paf, respiratory distress/CO2 retention 3. Due to bladder management, bowel management, safety, skin/wound care, disease management, medication administration, pain management and patient education, does the patient  require 24 hr/day rehab nursing? Yes and Potentially 4. Does the patient require coordinated care of a physician, rehab nurse, PT (1-2 hrs/day, 5 days/week), OT (1-2 hrs/day, 5 days/week) and SLP (1-2 hrs/day, 5 days/week) to address physical and functional deficits in the context of the above medical diagnosis(es)? Yes Addressing deficits in the following areas: balance, endurance, locomotion, strength, transferring, bowel/bladder control, bathing, dressing, feeding, grooming, toileting, cognition, speech, swallowing and psychosocial support 5. Can the patient actively participate in an intensive therapy program of at least 3 hrs of therapy per day at least 5 days per week? Potentially 6. The potential for patient to make measurable gains while on inpatient rehab is good 7. Anticipated functional outcomes upon discharge from inpatient rehab are supervision  with PT, supervision and min assist with OT, supervision with SLP. 8. Estimated rehab length of stay to reach the above functional goals is: potentially 14-20 days 9. Does the patient have adequate social supports and living environment to accommodate these discharge functional goals? Yes 10. Anticipated D/C setting: Home 11. Anticipated post D/C treatments: HH therapy 12. Overall Rehab/Functional Prognosis: excellent  RECOMMENDATIONS: This patient's condition is appropriate for continued rehabilitative care in the following setting: CIR Patient  has agreed to participate in recommended program. N/A Note that insurance prior authorization may be required for reimbursement for recommended care.  Comment: Pt with active medical issues as noted above. Once he stabilizes will be a great inpatient rehab candidate. Was active and independent prior to this hospitalization. Daughter appears supportive.   Rehab Admissions Coordinator to follow up.  Thanks,  Ranelle Oyster, MD, Earlie Counts, PA-C 08/31/2016

## 2016-08-31 NOTE — Progress Notes (Signed)
Lactic acid drawn by lab, results pending.

## 2016-08-31 NOTE — Progress Notes (Signed)
Rehab admissions - I am following for potential acute inpatient rehab admission.  Will need an OT evaluation since patient has [employer] medicare insurance.  I will follow along for progress.  Call me for questions.  #[phone]

## 2016-08-31 NOTE — Progress Notes (Signed)
Bladder scanned pt and residual was 48 ml; pt had bowel movement; foley d/c on dayshift; no void as of yet.

## 2016-09-01 ENCOUNTER — Inpatient Hospital Stay (HOSPITAL_COMMUNITY): Payer: Medicare Other

## 2016-09-01 DIAGNOSIS — R6521 Severe sepsis with septic shock: Secondary | ICD-10-CM

## 2016-09-01 DIAGNOSIS — R4182 Altered mental status, unspecified: Secondary | ICD-10-CM

## 2016-09-01 DIAGNOSIS — A419 Sepsis, unspecified organism: Secondary | ICD-10-CM

## 2016-09-01 DIAGNOSIS — J9601 Acute respiratory failure with hypoxia: Secondary | ICD-10-CM

## 2016-09-01 LAB — CBC
HEMATOCRIT: 42.6 % (ref 39.0–52.0)
HEMOGLOBIN: 14.1 g/dL (ref 13.0–17.0)
MCH: 31.3 pg (ref 26.0–34.0)
MCHC: 33.1 g/dL (ref 30.0–36.0)
MCV: 94.7 fL (ref 78.0–100.0)
Platelets: 249 10*3/uL (ref 150–400)
RBC: 4.5 MIL/uL (ref 4.22–5.81)
RDW: 14.3 % (ref 11.5–15.5)
WBC: 13.9 10*3/uL — ABNORMAL HIGH (ref 4.0–10.5)

## 2016-09-01 LAB — BASIC METABOLIC PANEL
ANION GAP: 11 (ref 5–15)
BUN: 51 mg/dL — ABNORMAL HIGH (ref 6–20)
CALCIUM: 9 mg/dL (ref 8.9–10.3)
CHLORIDE: 108 mmol/L (ref 101–111)
CO2: 23 mmol/L (ref 22–32)
Creatinine, Ser: 1.88 mg/dL — ABNORMAL HIGH (ref 0.61–1.24)
GFR calc Af Amer: 38 mL/min — ABNORMAL LOW (ref >60)
GFR calc non Af Amer: 32 mL/min — ABNORMAL LOW (ref >60)
Glucose, Bld: 148 mg/dL — ABNORMAL HIGH (ref 65–99)
Potassium: 3.7 mmol/L (ref 3.5–5.1)
Sodium: 142 mmol/L (ref 135–145)

## 2016-09-01 LAB — BLOOD GAS, ARTERIAL
Acid-base deficit: 3.6 mmol/L — ABNORMAL HIGH (ref 0.0–2.0)
BICARBONATE: 20.5 mmol/L (ref 20.0–28.0)
Drawn by: 398991
FIO2: 100
LHR: 14 {breaths}/min
O2 Saturation: 97.5 %
PEEP: 5 cmH2O
PO2 ART: 113 mmHg — AB (ref 83.0–108.0)
Patient temperature: 98.6
VT: 620 mL
pCO2 arterial: 35.1 mmHg (ref 32.0–48.0)
pH, Arterial: 7.385 (ref 7.350–7.450)

## 2016-09-01 LAB — PROCALCITONIN: PROCALCITONIN: 1.6 ng/mL

## 2016-09-01 LAB — URINE CULTURE: Culture: NO GROWTH

## 2016-09-01 LAB — LACTIC ACID, PLASMA
LACTIC ACID, VENOUS: 2.2 mmol/L — AB (ref 0.5–1.9)
Lactic Acid, Venous: 2.3 mmol/L (ref 0.5–1.9)

## 2016-09-01 LAB — GLUCOSE, CAPILLARY
GLUCOSE-CAPILLARY: 115 mg/dL — AB (ref 65–99)
Glucose-Capillary: 133 mg/dL — ABNORMAL HIGH (ref 65–99)

## 2016-09-01 MED ORDER — SERTRALINE HCL 25 MG PO TABS
25.0000 mg | ORAL_TABLET | Freq: Every day | ORAL | Status: DC
  Administered 2016-09-02: 25 mg
  Filled 2016-09-01 (×2): qty 1

## 2016-09-01 MED ORDER — RIVAROXABAN 15 MG PO TABS
15.0000 mg | ORAL_TABLET | Freq: Every day | ORAL | Status: DC
  Administered 2016-09-02: 15 mg
  Filled 2016-09-01 (×3): qty 1

## 2016-09-01 MED ORDER — FENTANYL CITRATE (PF) 100 MCG/2ML IJ SOLN
INTRAMUSCULAR | Status: AC
  Filled 2016-09-01: qty 2

## 2016-09-01 MED ORDER — MIDAZOLAM HCL 2 MG/2ML IJ SOLN: 1.0000 mg | INTRAMUSCULAR | Status: DC | PRN

## 2016-09-01 MED ORDER — DEXTROSE 5 % IV SOLN
1.0000 g | INTRAVENOUS | Status: DC
  Administered 2016-09-01: 1 g via INTRAVENOUS
  Filled 2016-09-01: qty 10

## 2016-09-01 MED ORDER — VANCOMYCIN HCL 10 G IV SOLR
1250.0000 mg | INTRAVENOUS | Status: DC
  Administered 2016-09-02: 1250 mg via INTRAVENOUS
  Filled 2016-09-01: qty 1250

## 2016-09-01 MED ORDER — ORAL CARE MOUTH RINSE
15.0000 mL | Freq: Four times a day (QID) | OROMUCOSAL | Status: DC
  Administered 2016-09-01 – 2016-09-03 (×8): 15 mL via OROMUCOSAL

## 2016-09-01 MED ORDER — FENTANYL CITRATE (PF) 100 MCG/2ML IJ SOLN
50.0000 ug | Freq: Once | INTRAMUSCULAR | Status: AC
  Administered 2016-09-01: 50 ug via INTRAVENOUS

## 2016-09-01 MED ORDER — ROCURONIUM BROMIDE 50 MG/5ML IV SOLN
60.0000 mg | Freq: Once | INTRAVENOUS | Status: AC
  Administered 2016-09-01: 60 mg via INTRAVENOUS
  Filled 2016-09-01: qty 6

## 2016-09-01 MED ORDER — MIDAZOLAM HCL 2 MG/2ML IJ SOLN
1.0000 mg | Freq: Once | INTRAMUSCULAR | Status: AC
  Administered 2016-09-01: 1 mg via INTRAVENOUS

## 2016-09-01 MED ORDER — RIVAROXABAN 15 MG PO TABS
15.0000 mg | ORAL_TABLET | Freq: Every day | ORAL | Status: DC
  Filled 2016-09-01: qty 1

## 2016-09-01 MED ORDER — INSULIN ASPART 100 UNIT/ML ~~LOC~~ SOLN
1.0000 [IU] | SUBCUTANEOUS | Status: DC
  Administered 2016-09-01 – 2016-09-02 (×3): 1 [IU] via SUBCUTANEOUS
  Administered 2016-09-02 – 2016-09-03 (×4): 2 [IU] via SUBCUTANEOUS
  Administered 2016-09-03: 1 [IU] via SUBCUTANEOUS

## 2016-09-01 MED ORDER — FAMOTIDINE IN NACL 20-0.9 MG/50ML-% IV SOLN
20.0000 mg | Freq: Two times a day (BID) | INTRAVENOUS | Status: DC
  Administered 2016-09-01 – 2016-09-03 (×5): 20 mg via INTRAVENOUS
  Filled 2016-09-01 (×6): qty 50

## 2016-09-01 MED ORDER — MIDAZOLAM HCL 2 MG/2ML IJ SOLN
INTRAMUSCULAR | Status: AC
  Filled 2016-09-01: qty 2

## 2016-09-01 MED ORDER — SODIUM CHLORIDE 0.9 % IV BOLUS (SEPSIS)
1000.0000 mL | Freq: Once | INTRAVENOUS | Status: AC
  Administered 2016-09-01: 1000 mL via INTRAVENOUS

## 2016-09-01 MED ORDER — VANCOMYCIN HCL 10 G IV SOLR
1500.0000 mg | Freq: Once | INTRAVENOUS | Status: AC
  Administered 2016-09-01: 1500 mg via INTRAVENOUS
  Filled 2016-09-01: qty 1500

## 2016-09-01 MED ORDER — SODIUM CHLORIDE 0.9 % IV BOLUS (SEPSIS)
250.0000 mL | Freq: Once | INTRAVENOUS | Status: AC
  Administered 2016-09-01: 250 mL via INTRAVENOUS

## 2016-09-01 MED ORDER — BISACODYL 10 MG RE SUPP: 10.0000 mg | Freq: Every day | RECTAL | Status: DC | PRN

## 2016-09-01 MED ORDER — ROSUVASTATIN CALCIUM 20 MG PO TABS
20.0000 mg | ORAL_TABLET | Freq: Every day | ORAL | Status: DC
  Administered 2016-09-02: 20 mg
  Filled 2016-09-01 (×2): qty 1

## 2016-09-01 MED ORDER — FENTANYL CITRATE (PF) 100 MCG/2ML IJ SOLN: 50.0000 ug | INTRAMUSCULAR | Status: DC | PRN

## 2016-09-01 MED ORDER — CHLORHEXIDINE GLUCONATE 0.12% ORAL RINSE (MEDLINE KIT)
15.0000 mL | Freq: Two times a day (BID) | OROMUCOSAL | Status: DC
  Administered 2016-09-01 – 2016-09-03 (×4): 15 mL via OROMUCOSAL

## 2016-09-01 MED ORDER — PIPERACILLIN-TAZOBACTAM 3.375 G IVPB
3.3750 g | Freq: Three times a day (TID) | INTRAVENOUS | Status: DC
  Administered 2016-09-01 – 2016-09-03 (×6): 3.375 g via INTRAVENOUS
  Filled 2016-09-01 (×9): qty 50

## 2016-09-01 MED ORDER — ALBUTEROL SULFATE (2.5 MG/3ML) 0.083% IN NEBU
2.5000 mg | INHALATION_SOLUTION | RESPIRATORY_TRACT | Status: DC
  Administered 2016-09-01 – 2016-09-03 (×10): 2.5 mg via RESPIRATORY_TRACT
  Filled 2016-09-01 (×10): qty 3

## 2016-09-01 MED ORDER — ETOMIDATE 2 MG/ML IV SOLN
20.0000 mg | Freq: Once | INTRAVENOUS | Status: AC
  Administered 2016-09-01: 20 mg via INTRAVENOUS

## 2016-09-01 MED ORDER — SENNOSIDES 8.8 MG/5ML PO SYRP
5.0000 mL | ORAL_SOLUTION | Freq: Two times a day (BID) | ORAL | Status: DC | PRN
  Filled 2016-09-01: qty 5

## 2016-09-01 MED ORDER — VALPROATE SODIUM 250 MG/5ML PO SOLN
500.0000 mg | ORAL | Status: DC
  Administered 2016-09-01 – 2016-09-02 (×2): 500 mg
  Filled 2016-09-01 (×4): qty 10

## 2016-09-01 NOTE — Progress Notes (Signed)
Pharmacy Antibiotic Note  Adrian DrownWilliam R Escorcia is a 81 y.o. male admitted on 08/26/2016 with stroke now with new aspiration pneumonia.  Pharmacy has been consulted for Zosyn and now to add Vancomycin dosing. WBC up to 13.8. Intubated for respiratory distress. AKI- SCr 1.88, estimated CrCl ~35 mL/min. Making UOP - not accurately charted as of now. CXR- infiltrate.   Plan: Vancomycin 1500mg  IV x1 then 1250mg  IV every 24 hours.  Goal trough 15-20 mcg/mL. Zosyn 3.375g IV q8h (4 hour infusion).  Monitor renal function, culture results, and clinical status.    Height: 6' (182.9 cm) Weight: 197 lb 9.6 oz (89.6 kg) IBW/kg (Calculated) : 77.6  Temp (24hrs), Avg:99 F (37.2 C), Min:98.1 F (36.7 C), Max:99.8 F (37.7 C)   Recent Labs Lab 08/26/16 0520 08/27/16 0011 08/27/16 0435 08/28/16 0617 08/29/16 0543 08/31/16 0203 08/31/16 1757 08/31/16 2031 09/01/16 0157  WBC 9.6 9.2  --   --  8.4 8.9  --   --  13.9*  CREATININE 1.01  --  1.05 1.10 1.09 1.10  --   --  1.88*  LATICACIDVEN  --   --   --   --   --   --  1.7 1.5  --     Estimated Creatinine Clearance: 35 mL/min (by C-G formula based on SCr of 1.88 mg/dL (H)).    Allergies  Allergen Reactions  . Statins     May have caused leg weakness in the past, but willing to try again (08/30/16)     Antimicrobials this admission: Ceftriaxone 12/8 >> 12/12; 12/15 x 1 Zosyn 12/15 >> Vancomycin 12/15 >>  Dose adjustments this admission:   Microbiology results: 12/8 UCx: neg 12/12 MRSA PCR: sent  12/14 UCx: sent 12/15 BCx:   Thank you for allowing pharmacy to be a part of this patient's care.  Link SnufferJessica Hanford Lust, PharmD, BCPS Clinical Pharmacist [phone] until 3:30 PM today [phone] after hours 09/01/2016 2:59 PM

## 2016-09-01 NOTE — Progress Notes (Signed)
PT Cancellation Note  Patient Details Name: Adrian DrownWilliam R Whiteside MRN: [id number] DOB: 04/19/1935   Cancelled Treatment:    Reason Eval/Treat Not Completed: Fatigue/lethargy limiting ability to participate. Pt continues to be very lethargic and currently sleeping on CPAP. Family requesting that therapist return at another time secondary to pt's lethargy. PT will continue to f/u with pt as appropriate.   Alessandra BevelsJennifer M Taiten Brawn 09/01/2016, 10:37 AM Deborah ChalkJennifer Wael Maestas, PT, DPT [phone]

## 2016-09-01 NOTE — Progress Notes (Signed)
Rehab admissions - Patient is not medically ready for inpatient rehab admission today.  I will follow up on Monday for progress and plans.  Call me for questions.  #[phone]

## 2016-09-01 NOTE — Progress Notes (Signed)
SLP Cancellation Note  Patient Details Name: Adrian DrownWilliam R Coby MRN: [id number] DOB: 01/09/1935   Cancelled treatment:       Reason Eval/Treat Not Completed: Patient's level of consciousness;Patient not medically ready. Pt still sleeping with CPAP. Spoke with dtr who reports pt is improving on CPAP, but still quite lethargic. No PO trials today. Will plan to check on saturday for readiness. Pt may need short term alternate nutrition.   Harlon DittyBonnie Emagene Merfeld, [place] CCC-SLP [phone]  Claudine MoutonDeBlois, Hades Mathew Caroline 09/01/2016, 10:33 AM

## 2016-09-01 NOTE — Progress Notes (Signed)
eLink Physician-Brief Progress Note Patient Name: Adrian DrownWilliam R Moro DOB: 12/13/1934 MRN: [id number]   Date of Service  09/01/2016  HPI/Events of Note  normotensuive but having  A Flutter with 4:1 with HR 50s. Does not seem med induced  eICU Interventions  Check lytes     Intervention Category Major Interventions: Arrhythmia - evaluation and management  Shaquelle Hernon 09/01/2016, 11:19 PM

## 2016-09-01 NOTE — Progress Notes (Signed)
eLink Physician-Brief Progress Note Patient Name: Adrian DrownWilliam R Baccari DOB: 02/17/1935 MRN: [id number]   Date of Service  09/01/2016  HPI/Events of Note  Lactic Acid = 2.3.  eICU Interventions  Will bolus with 0.9 NaCl 1 liter IV over 1 hour now.      Intervention Category Major Interventions: Acid-Base disturbance - evaluation and management  Sharonlee Nine Eugene 09/01/2016, 8:23 PM

## 2016-09-01 NOTE — Progress Notes (Signed)
Paged Ms. Craige CottaKirby, NP and informed her that pt had not voided since foley d/cd at 1740. Pt had bowel movement but not void; condom catheter in place. NP ordered NS bolus 250 ml at 250 ml/hr and also order NS continuous at 75 ml/hr. Bolus is infusing at this time.

## 2016-09-01 NOTE — Consult Note (Signed)
PULMONARY / CRITICAL CARE MEDICINE   Name: Adrian Melendez MRN: [id number] DOB: 04-19-1935    ADMISSION DATE:  2016/08/26 CONSULTATION DATE:  12/15  REFERRING MD:  Jomarie Longs   CHIEF COMPLAINT:  Acute resp failure and sepsis   HISTORY OF PRESENT ILLNESS:   81 year old male w/ sig h/o gout, CHF, CKD, afib/flutter and prior CVA. Admitted 12/8 w/ altered MS. Neuro was consulted. An MRI of brain was obtained and this showed multiple new strokes. He was also being treated for presumed UTI. On 12/15 PCCM was urgently consulted when he was found by RT unresponsive. Discussion w/ the medical team and family identified that he had been progressively lethargic, was placed on CPAP on 12/14 and over the course of the day on 12/15 had escalating O2 requirements up to 5 liters just to keep his sats > 90%. A STAT CXR revealed an new right sided PNA. He was transferred to the ICU for altered MS and intubation and treatment of severe sepsis.   PAST MEDICAL HISTORY :  He  has a past medical history of Atrial flutter (HCC); CKD (chronic kidney disease), stage III; Colon polyps; Diastolic dysfunction; Dizziness (02/29/2012); Gout; Gout; Headache; History of nuclear stress test (03/28/2012); migraines; TIA (transient ischemic attack) (June 2008); Hyperglycemia; Hyperlipidemia; Hyperlipidemia; Hypertension; Hypokalemia; Migraine aura without headache; Migraines; Stroke (HCC); Syncope, near (02/29/2012); and Urinary retention.  PAST SURGICAL HISTORY: He  has a past surgical history that includes Eye surgery; transthoracic echocardiogram (03/01/2012); and Colonoscopy.  Allergies  Allergen Reactions  . Statins     May have caused leg weakness in the past, but willing to try again (08/30/16)     No current facility-administered medications on file prior to encounter.    Current Outpatient Prescriptions on File Prior to Encounter  Medication Sig  . allopurinol (ZYLOPRIM) 300 MG tablet Take 450 mg by mouth daily.  Take one and a half tablets (450mg ) daily  . amLODipine (NORVASC) 5 MG tablet Take 1 tablet (5 mg total) by mouth daily.  [name] aspirin 81 MG tablet Take 81 mg by mouth daily.  . divalproex (DEPAKOTE ER) 500 MG 24 hr tablet TAKE 1 TABLET EVERY DAY  . sertraline (ZOLOFT) 25 MG tablet Take 25 mg by mouth daily.  [name] Ubiquinol 100 MG CAPS Take 200 mg by mouth daily.   [name] 20 MG TABS tablet TAKE 1 TABLET BY MOUTH DAILY WITH SUPPER    FAMILY HISTORY:  His indicated that his mother is deceased. He indicated that his father is deceased. He indicated that his maternal grandmother is deceased. He indicated that his maternal grandfather is deceased. He indicated that his paternal grandmother is deceased. He indicated that his paternal grandfather is deceased.    SOCIAL HISTORY: He  reports that he has never smoked. He has never used smokeless tobacco. He reports that he does not drink alcohol or use drugs.  REVIEW OF SYSTEMS:   Unable   SUBJECTIVE:  Critically ill   VITAL SIGNS: BP (!) 156/81   Pulse 74   Temp 99.8 F (37.7 C) (Rectal)   Resp (!) 25   Ht 6' (1.829 m)   Wt 197 lb 9.6 oz (89.6 kg)   SpO2 92%   BMI 26.80 kg/m   HEMODYNAMICS:    VENTILATOR SETTINGS:    INTAKE / OUTPUT: I/O last 3 completed shifts: In: 323.8 [I.V.:323.8] Out: 760 [Urine:760]  PHYSICAL EXAMINATION: General:  Acutely ill appearing. Now unresponsive on NIPPV Neuro:  Not responsive.  GCS 6.  HEENT:  CPAP mask in place no JVD  Cardiovascular:  Regular irreg  Lungs:  Scattered course rhonchi  Abdomen:  Soft + bowel sounds Musculoskeletal:  Generalized weakness  Skin:  Warm dry. Bilateral LE edema   LABS:  BMET  Recent Labs Lab 08/29/16 0543 08/31/16 0203 09/01/16 0157  NA 139 143 142  K 3.3* 3.4* 3.7  CL 102 106 108  CO2 24 23 23   BUN 22* 28* 51*  CREATININE 1.09 1.10 1.88*  GLUCOSE 115* 128* 148*    Electrolytes  Recent Labs Lab 08/29/16 0543 08/31/16 0203 09/01/16 0157   CALCIUM 8.9 9.0 9.0    CBC  Recent Labs Lab 08/29/16 0543 08/31/16 0203 09/01/16 0157  WBC 8.4 8.9 13.9*  HGB 14.6 15.7 14.1  HCT 42.6 46.7 42.6  PLT 199 211 249    Coag's  Recent Labs Lab 08/27/16 0011  INR 2.32    Sepsis Markers  Recent Labs Lab 08/31/16 1757 08/31/16 2031  LATICACIDVEN 1.7 1.5    ABG  Recent Labs Lab 08/28/16 1220  PHART 7.488*  PCO2ART 32.0  PO2ART 69.1*    Liver Enzymes No results for input(s): AST, ALT, ALKPHOS, BILITOT, ALBUMIN in the last 168 hours.  Cardiac Enzymes  Recent Labs Lab 08/29/16 1029 08/29/16 1656 08/29/16 2131  TROPONINI 0.03* <0.03 0.03*    Glucose  Recent Labs Lab 08/28/16 1525  GLUCAP 118*    Imaging Dg Chest Port 1 View  Result Date: 09/01/2016 CLINICAL DATA:  Initial evaluation for increase hypoxia. EXAM: PORTABLE CHEST 1 VIEW COMPARISON:  Prior radiograph from 08/31/2016. FINDINGS: Patient is markedly rotated to the right. Cardiomegaly grossly stable. Mediastinal silhouette grossly within normal limits. Lungs normally inflated. New parenchymal infiltrate within the right lower lobe, concerning for pneumonia. Probable mild involvement within the right upper lobe as well. Small right parapneumonic effusion. Left lung is clear. No pneumothorax. No acute osseous abnormality. IMPRESSION: 1. Interval development of right lower and upper lobe parenchymal infiltrates, concerning for pneumonia. 2. Small right parapneumonic effusion. Electronically Signed   By: Rise MuBenjamin  McClintock M.D.   On: 09/01/2016 14:03   Dg Chest Port 1 View  Result Date: 08/31/2016 CLINICAL DATA:  Initial evaluation for acute dyspnea. EXAM: PORTABLE CHEST 1 VIEW COMPARISON:  Prior radiograph from 01/13/2016. FINDINGS: Stable cardiomegaly. Mediastinal silhouette within normal limits. Aortic atherosclerosis noted. Lungs are hypoinflated. Mild bibasilar subsegmental atelectasis and/or scarring, similar to previous. No focal infiltrates.  No pulmonary edema. No obvious pleural effusion on this limited frontal view the chest. No pneumothorax. Osseous structures unchanged. IMPRESSION: 1. Shallow lung inflation with mild bibasilar atelectasis/scarring, similar to previous. 2. Stable cardiomegaly without pulmonary edema. 3. Aortic atherosclerosis. Electronically Signed   By: Rise MuBenjamin  McClintock M.D.   On: 08/31/2016 18:20  PCXR New aspiration PNA    STUDIES:    CULTURES: BC x2 12/14>>> UC 12/9: neg Sputum 12/15>>>  ANTIBIOTICS: Zosyn 12/15>>> vanc 12/15>>>  SIGNIFICANT EVENTS:   LINES/TUBES:   DISCUSSION: 81 year old male admitted 12/8 w/ encephalopathy in setting of multiple acute strokes and presumed UTI. MS declining over last 24 hrs, requiring escalating oxygen on CPAP. PCCM asked to see on 12/15 when he was found unresponsive. Now w/what is likely aspiration PNA and resultant acute hypoxic resp failure and sepsis. Will move to ICU, intubate and add nosocomial abx coverage. Should his MS not improve will need further CT imaging of his brain.   ASSESSMENT / PLAN:  PULMONARY A: Acute hypoxic Respiratory failure  Aspiration PNA vs HCAP  Not protecting airway  P:   Intubate/ventilate PAD protocol  Send sputum F/u abg and CXR  CARDIOVASCULAR A:  H/o afib Severe Sepsis P:  Will place him on clear-sight  Ck lactic acid Volume as indicated Tele  RENAL A:   AKI P:  Hold any antihypertensives and diuretics  Volume resuscitation Renal dose meds  GASTROINTESTINAL A:   Dysphagia  P:   NPO Place Cortrak PPI for SUP  HEMATOLOGIC A:   Leukocytosis  Chronic anticoagulation  P:  See ID Cont xarelto    INFECTIOUS A:   Aspiration pneumonia vs HCAP  Sepsis  Presumed UTI P:   Transfer to ICU Pan culture  PCT algo Widen abx for nosocomial coverage    ENDOCRINE A:   Hyperglycemia  P:   Trend glucose   NEUROLOGIC A:   Acute encephalopathy-->likely d/t sepsis and hypercarbia  Acute  bilateral posterior circulation embolic strokes P:   RASS goal: -1 Cont xarelto  If MS does not improve will need further CT imaging to r/o extension of stroke   FAMILY  - Updates: family discussed plan of care at bedside.   - Inter-disciplinary family meet or Palliative Care meeting due by:12/22  Simonne MartinetPeter E Babcock ACNP-BC [facility] Pulmonary/Critical Care Pager # [phone] OR # [phone] if no answer   09/01/2016, 2:09 PM

## 2016-09-01 NOTE — Progress Notes (Addendum)
Interval f/u Critical care resuscitation Sepsis - Repeat Assessment Performed at: 1530  Interval Now in intensive care Intubation unremarkable. Did have copious purulent secretions on cords.  Clear sight data: Initial Stroke Volume: 64, had 22% increase w/ 250 ml crystalloid; looks like optimal SV about 90 ml/b SVR low 800s.   BP 97/65   Pulse 99   Temp 99.8 F (37.7 C) (Rectal)   Resp 14   Ht 6' (1.829 m)   Wt 197 lb 9.6 oz (89.6 kg)   SpO2 100%   BMI 26.80 kg/m  Heart:Irregular rate and rhythm w/ AF on tele.  Lungs scattered and diffuse Rhonchi  Capillary Refill: <2 sec Peripheral Pulse: Radial pulse palpable Skin: Normal Color  Vent Mode: PRVC FiO2 (%):  [100 %] 100 % Set Rate:  [14 bmp] 14 bmp Vt Set:  [620 mL] 620 mL PEEP:  [5 cmH20] 5 cmH20 Plateau Pressure:  [19 cmH20] 19 cmH20 Impression/plan  Acute encephalopathy. Worse over last 24 hrs. Suspect mix of hypercarbia and sepsis. Alternatively consider extension of stroke.  -consider repeat CT imaging if MS does not improve  Acute Hypoxic Respiratory failure d/t aspiration PNA vs HCAP -full vent support -PAD protocol -am cxr and abg  Severe sepsis/Septic shock: initial SV challenge showing 22% increase w/ volume challenge.  -repeated volume challenge until <10% change (Our goal is SV 3590ml/b) -abx widened.  -lactic acid pending.   AF -tele -xarelto OK for now; may need to re-assess  Dysphagia -Cortrak ordered -start tubefeeds in am 12/16  AKI -volume resuscitation  -strict I&O -pharmacy dosing drugs  My critical care time total 60 minutes  Simonne MartinetPeter E Breaker Springer ACNP-BC [facility] Pulmonary/Critical Care Pager # [phone] OR # [phone] if no answer \

## 2016-09-01 NOTE — Progress Notes (Addendum)
PROGRESS NOTE    Adrian Melendez  WUJ:[id number] DOB: 26-Feb-1935 DOA: 08/23/2016 PCP: Garlan Fillers, MD    Brief Narrative: Adrian Melendez is a 81 y.o. male with a Past Medical History  migraines, high cholesterol, gout, CHF, CKD, atrial flutter and stroke who presented with altered mental status.  Neurologist consulted and MRI was ordered. MRI consistent with multiple strokes.  Then 12/12 patient had an episode of unresponsiveness when he was getting out of the bed to bedside commode. His eyes rolled  back, and he was not responsive per daughter.  Continues to be lethargic and now with low grade fevers  Assessment & Plan:   Acute/Sub acute stroke;  -MRI positive for several small foci of diffusion restriction within pons, bilateral occipital lobes, scattered in right posterior temporal lobe, and in the right thalamus compatible with acute/ early subacute infarction. -despite compliance with xarelto for Afib -SLp eval ongoing, NPO pending repeat swallow eval -continue Xarelto -ECHo with normal EF and wall motion, carotid duplex unremarkable -Pt eval completed, CIR recommended -NEuro following  Syncope;  Patient had unresponsive syncopal episode on 12/12, no events on tele -long history of orthostatic hypotension and just recently started on Flomax inpatient which likely contributed -Stopped Flomax -Follow-up 2-D echocardiogram -EEG without seizure activity  Acute encephalopathy -Suspect related to stroke compounded by delirium, Depakote and non compliance with CPAP and now infection -no sedating meds -RPR negative. Ammonia 26.m, B 12 normal.  -changed depakote to QHS, continue CPAP QHS and daytime sleep -now low grade fever, with foley, catheter associated infection possible considering low grade fever, we changed his foley 12/14, CXR unremarkable -started on IV ceftriaxone 12/15 am will change to Zosyn -lactate last pm was normal  Urine retention,  -Received 4 days  of IV antibiotics.  -Renal US; No hydronephrosis. The bladder is completely decompressed by Foley catheter.  -urine culture with no growth. -stopped antibiotics.  -Voiding trial when able to ambulate better, suspect will need Foley at discharge to Rehab  Hypokalemia; repleted IV .   A fib; on xarelto.  -stable now  HTN; Hold Norvasc, due to syncope and acute CVA  DVT prophylaxis: on xarelto  Code Status: Full code, discussed with daughter to reconsider this, she wanted to talk to other family members before deciding anything Family Communication: daughter at bedside.  Disposition Plan: CIR when improved  Consultants:   none   Procedures: none   Antimicrobials: ceftriaxone 12-09   Subjective: Slept most of the day yesterday, just waking up  Objective: Vitals:   09/01/16 0400 09/01/16 0500 09/01/16 0800 09/01/16 0840  BP: (!) 129/56 138/73 (!) 156/81   Pulse: 66 (!) 57 74   Resp: 12 15 (!) 25   Temp:   98.1 F (36.7 C)   TempSrc:   Axillary   SpO2: (!) 89% 92% 92%   Weight:    89.6 kg (197 lb 9.6 oz)  Height:    6' (1.829 m)    Intake/Output Summary (Last 24 hours) at 09/01/16 1248 Last data filed at 09/01/16 1046  Gross per 24 hour  Intake            772.5 ml  Output              460 ml  Net            312.5 ml   Filed Weights   08/26/2016 1413 08/29/16 1154 09/01/16 0840  Weight: 93.3 kg (205 lb 9.6 oz) 92.4 kg (203  lb 12.8 oz) 89.6 kg (197 lb 9.6 oz)    Examination:  General exam: somnolent, arousable, no distress, opens eyes and then falls back to sleep Respiratory system: decreased BS at bases Cardiovascular system: S1 & S2 heard, RRR. No JVD, murmurs, rubs, gallops or clicks. No pedal edema. Gastrointestinal system: Abdomen is nondistended, soft and nontender.Normal bowel sounds heard. Central nervous system:  No focal neurological deficits, somnolent. Extremities: Symmetric 5 x 5 power. Skin: No rashes, lesions or ulcers  Data Reviewed: I have  personally reviewed following labs and imaging studies  CBC:  Recent Labs Lab 08/26/16 0520 08/27/16 0011 08/29/16 0543 08/31/16 0203 09/01/16 0157  WBC 9.6 9.2 8.4 8.9 13.9*  HGB 13.6 13.7 14.6 15.7 14.1  HCT 40.7 40.3 42.6 46.7 42.6  MCV 92.1 92.6 91.4 92.8 94.7  PLT 174 169 199 211 249   Basic Metabolic Panel:  Recent Labs Lab 08/27/16 0435 08/28/16 0617 08/29/16 0543 08/31/16 0203 09/01/16 0157  NA 140 140 139 143 142  K 3.4* 3.6 3.3* 3.4* 3.7  CL 103 104 102 106 108  CO2 28 25 24 23 23   GLUCOSE 99 108* 115* 128* 148*  BUN 16 19 22* 28* 51*  CREATININE 1.05 1.10 1.09 1.10 1.88*  CALCIUM 8.6* 8.8* 8.9 9.0 9.0   GFR: Estimated Creatinine Clearance: 35 mL/min (by C-G formula based on SCr of 1.88 mg/dL (H)). Liver Function Tests: No results for input(s): AST, ALT, ALKPHOS, BILITOT, PROT, ALBUMIN in the last 168 hours. No results for input(s): LIPASE, AMYLASE in the last 168 hours. No results for input(s): AMMONIA in the last 168 hours. Coagulation Profile:  Recent Labs Lab 08/27/16 0011  INR 2.32   Cardiac Enzymes:  Recent Labs Lab 08/29/16 1029 08/29/16 1656 08/29/16 2131  TROPONINI 0.03* <0.03 0.03*   BNP (last 3 results) No results for input(s): PROBNP in the last 8760 hours. HbA1C:  Recent Labs  08/29/16 1656  HGBA1C 6.0*   CBG:  Recent Labs Lab 08/28/16 1525  GLUCAP 118*   Lipid Profile:  Recent Labs  08/30/16 0223  CHOL 142  HDL 44  LDLCALC 84  TRIG 70  CHOLHDL 3.2   Thyroid Function Tests: No results for input(s): TSH, T4TOTAL, FREET4, T3FREE, THYROIDAB in the last 72 hours. Anemia Panel: No results for input(s): VITAMINB12, FOLATE, FERRITIN, TIBC, IRON, RETICCTPCT in the last 72 hours. Sepsis Labs:  Recent Labs Lab 08/31/16 1757 08/31/16 2031  LATICACIDVEN 1.7 1.5    Recent Results (from the past 240 hour(s))  Urine culture     Status: None   Collection Time: Nov 28, 2015  9:42 AM  Result Value Ref Range Status    Specimen Description URINE, RANDOM  Final   Special Requests NONE  Final   Culture NO GROWTH  Final   Report Status 08/26/2016 FINAL  Final  MRSA PCR Screening     Status: None   Collection Time: 08/29/16 12:03 PM  Result Value Ref Range Status   MRSA by PCR NEGATIVE NEGATIVE Final    Comment:        The GeneXpert MRSA Assay (FDA approved for NASAL specimens only), is one component of a comprehensive MRSA colonization surveillance program. It is not intended to diagnose MRSA infection nor to guide or monitor treatment for MRSA infections.          Radiology Studies: Dg Chest Port 1 View  Result Date: 08/31/2016 CLINICAL DATA:  Initial evaluation for acute dyspnea. EXAM: PORTABLE CHEST 1 VIEW COMPARISON:  Prior radiograph from 10/04/15. FINDINGS: Stable cardiomegaly. Mediastinal silhouette within normal limits. Aortic atherosclerosis noted. Lungs are hypoinflated. Mild bibasilar subsegmental atelectasis and/or scarring, similar to previous. No focal infiltrates. No pulmonary edema. No obvious pleural effusion on this limited frontal view the chest. No pneumothorax. Osseous structures unchanged. IMPRESSION: 1. Shallow lung inflation with mild bibasilar atelectasis/scarring, similar to previous. 2. Stable cardiomegaly without pulmonary edema. 3. Aortic atherosclerosis. Electronically Signed   By: Rise MuBenjamin  McClintock M.D.   On: 08/31/2016 18:20        Scheduled Meds: . allopurinol  450 mg Oral Daily  . cefTRIAXone (ROCEPHIN)  IV  1 g Intravenous Q24H  . divalproex  500 mg Oral QHS  . hydrALAZINE  10 mg Intravenous Once  . mouth rinse  15 mL Mouth Rinse BID  . pneumococcal 23 valent vaccine  0.5 mL Intramuscular Tomorrow-1000  . polyethylene glycol  17 g Oral Daily  . rivaroxaban  15 mg Oral Q supper  . rosuvastatin  20 mg Oral q1800  . saccharomyces boulardii  250 mg Oral BID  . sertraline  25 mg Oral QHS   Continuous Infusions: . sodium chloride 75 mL/hr at 09/01/16  0527     LOS: 6 days    Time spent:35 minutes.     Zannie CoveJOSEPH,Holger Sokolowski, MD [facility] Pager [phone]  If 7PM-7AM, please contact night-coverage [email or web address] Password [email or web address] 09/01/2016, 12:48 PM

## 2016-09-01 NOTE — Progress Notes (Signed)
Pharmacy Antibiotic Note  Adrian Melendez is a 81 y.o. male admitted on 09/04/2016 with possible UTI.  Pharmacy has been consulted for Zosyn dosing.  Day #1 of abx for possible UTI. Cx's drawn. Afebrile, WBC 13.9.  Plan: Start Zosyn 3.375 gm IV q8h (4 hour infusion) Monitor clinical picture, renal function F/U C&S, abx deescalation / LOT   Height: 6' (182.9 cm) Weight: 197 lb 9.6 oz (89.6 kg) IBW/kg (Calculated) : 77.6  Temp (24hrs), Avg:99 F (37.2 C), Min:98.1 F (36.7 C), Max:99.8 F (37.7 C)   Recent Labs Lab 08/26/16 0520 08/27/16 0011 08/27/16 0435 08/28/16 0617 08/29/16 0543 08/31/16 0203 08/31/16 1757 08/31/16 2031 09/01/16 0157  WBC 9.6 9.2  --   --  8.4 8.9  --   --  13.9*  CREATININE 1.01  --  1.05 1.10 1.09 1.10  --   --  1.88*  LATICACIDVEN  --   --   --   --   --   --  1.7 1.5  --     Estimated Creatinine Clearance: 35 mL/min (by C-G formula based on SCr of 1.88 mg/dL (H)).    Allergies  Allergen Reactions  . Statins     May have caused leg weakness in the past, but willing to try again (08/30/16)     Antimicrobials this admission: Zosyn 12/15 >>   Dose adjustments this admission: n/a  Microbiology results: 12/15 BCx: sent 12/14 UCx: sent   Thank you for allowing pharmacy to be a part of this patient's care.  Enzo BiNathan Eman Morimoto, PharmD, BCPS Clinical Pharmacist Pager [phone] 09/01/2016 2:02 PM

## 2016-09-01 NOTE — Progress Notes (Signed)
eLink Physician-Brief Progress Note Patient Name: Mellody DrownWilliam R Sandra DOB: 06/30/1935 MRN: [id number]   Date of Service  09/01/2016  HPI/Events of Note  Non invasion SV = 90 >> 69  eICU Interventions  Will bolus with 0.9 NaCl 250 mL IV over 5-10 minutes now.      Intervention Category Major Interventions: Other:  Whitnee Orzel Dennard Nipugene 09/01/2016, 6:02 PM

## 2016-09-01 NOTE — Procedures (Signed)
Intubation Procedure Note Adrian Melendez [id number] 10/12/1934  Procedure: Intubation Indications: Respiratory insufficiency  Procedure Details Consent: Unable to obtain consent because of emergent medical necessity. Time Out: Verified patient identification, verified procedure, site/side was marked, verified correct patient position, special equipment/implants available, medications/allergies/relevent history reviewed, required imaging and test results available.  Performed  Maximum sterile technique was used including antiseptics, gloves, hand hygiene and mask.  MAC and 4 glide scope w/ 7.5 tube. Excellent view    Evaluation Hemodynamic Status: BP stable throughout; O2 sats: stable throughout Patient's Current Condition: stable Complications: No apparent complications Patient did tolerate procedure well. Chest X-ray ordered to verify placement.  CXR: pending.   Adrian Melendez 09/01/2016  Adrian Melendez ACNP-BC [facility] Pulmonary/Critical Care Pager # [phone] OR # [phone] if no answer

## 2016-09-01 NOTE — Progress Notes (Signed)
OT Cancellation Note  Patient Details Name: Adrian Melendez MRN: [id number] DOB: 01/11/1935   Cancelled Treatment:    Reason Eval/Treat Not Completed: Fatigue/lethargy limiting ability to participate. Daughter asked for OT to come back tomorrow so he could rest today. Explained need to assess pt. Daughter stated she understood and that she wants to wait until tomorrow.   [facility] Hospital[facility],HILLARY  Bailea Beed, OT/L  [phone] 09/01/2016 09/01/2016, 11:24 AM

## 2016-09-01 NOTE — Care Management Note (Signed)
Case Management Note Original Note initiated by Lawerance Sabalebbie Swist 08/28/16  Patient Details  Name: Adrian Melendez MRN: [id number] Date of Birth: 04/30/1935  Subjective/Objective:                 Patient admitted from home with wife. Has wlaker and WC, UTI IV Abx. Per bedside RN, patient more drowsy than yesterday, PT to eval today.    Action/Plan:  Anticipate HH, wife requesting new RW.   Expected Discharge Date:                  Expected Discharge Plan:  Home w Home Health Services  In-House Referral:     Discharge planning Services  CM Consult  Post Acute Care Choice:    Choice offered to:     DME Arranged:    DME Agency:     HH Arranged:    HH Agency:     Status of Service:  In process, will continue to follow  If discussed at Long Length of Stay Meetings, dates discussed:    Additional Comments: 09/01/2016 Pt transferred to ICU today and is now intubated.  CIR following  Adrian Melendez, Adrian Fohl S, RN 09/01/2016, 3:13 PM

## 2016-09-02 ENCOUNTER — Inpatient Hospital Stay (HOSPITAL_COMMUNITY): Payer: Medicare Other

## 2016-09-02 LAB — BLOOD GAS, ARTERIAL
ACID-BASE DEFICIT: 5.2 mmol/L — AB (ref 0.0–2.0)
Acid-base deficit: 4.5 mmol/L — ABNORMAL HIGH (ref 0.0–2.0)
BICARBONATE: 18.7 mmol/L — AB (ref 20.0–28.0)
Bicarbonate: 18.9 mmol/L — ABNORMAL LOW (ref 20.0–28.0)
DRAWN BY: 46203
Drawn by: 46203
FIO2: 70
FIO2: 60
LHR: 14 {breaths}/min
LHR: 14 {breaths}/min
MECHVT: 620 mL
O2 SAT: 97.3 %
O2 SAT: 95.9 %
PATIENT TEMPERATURE: 98.6
PATIENT TEMPERATURE: 98.6
PCO2 ART: 28 mmHg — AB (ref 32.0–48.0)
PEEP/CPAP: 5 cmH2O
PEEP: 5 cmH2O
PH ART: 7.443 (ref 7.350–7.450)
PH ART: 7.402 (ref 7.350–7.450)
PO2 ART: 97.8 mmHg (ref 83.0–108.0)
PO2 ART: 87.3 mmHg (ref 83.0–108.0)
VT: 620 mL
pCO2 arterial: 30.7 mmHg — ABNORMAL LOW (ref 32.0–48.0)

## 2016-09-02 LAB — PHOSPHORUS
PHOSPHORUS: 4.4 mg/dL (ref 2.5–4.6)
PHOSPHORUS: 3.2 mg/dL (ref 2.5–4.6)
Phosphorus: 5.8 mg/dL — ABNORMAL HIGH (ref 2.5–4.6)

## 2016-09-02 LAB — BASIC METABOLIC PANEL
ANION GAP: 14 (ref 5–15)
Anion gap: 11 (ref 5–15)
BUN: 63 mg/dL — ABNORMAL HIGH (ref 6–20)
BUN: 63 mg/dL — AB (ref 6–20)
CHLORIDE: 115 mmol/L — AB (ref 101–111)
CO2: 16 mmol/L — AB (ref 22–32)
CO2: 18 mmol/L — ABNORMAL LOW (ref 22–32)
CREATININE: 1.75 mg/dL — AB (ref 0.61–1.24)
Calcium: 8.2 mg/dL — ABNORMAL LOW (ref 8.9–10.3)
Calcium: 8.4 mg/dL — ABNORMAL LOW (ref 8.9–10.3)
Chloride: 116 mmol/L — ABNORMAL HIGH (ref 101–111)
Creatinine, Ser: 1.7 mg/dL — ABNORMAL HIGH (ref 0.61–1.24)
GFR calc Af Amer: 41 mL/min — ABNORMAL LOW (ref >60)
GFR calc Af Amer: 42 mL/min — ABNORMAL LOW (ref >60)
GFR calc non Af Amer: 37 mL/min — ABNORMAL LOW (ref >60)
GFR, EST NON AFRICAN AMERICAN: 35 mL/min — AB (ref >60)
GLUCOSE: 126 mg/dL — AB (ref 65–99)
GLUCOSE: 138 mg/dL — AB (ref 65–99)
POTASSIUM: 3.8 mmol/L (ref 3.5–5.1)
Potassium: 3.5 mmol/L (ref 3.5–5.1)
SODIUM: 145 mmol/L (ref 135–145)
Sodium: 145 mmol/L (ref 135–145)

## 2016-09-02 LAB — PROCALCITONIN: Procalcitonin: 11.72 ng/mL

## 2016-09-02 LAB — CBC
HEMATOCRIT: 40 % (ref 39.0–52.0)
HEMOGLOBIN: 13.1 g/dL (ref 13.0–17.0)
MCH: 31.2 pg (ref 26.0–34.0)
MCHC: 32.8 g/dL (ref 30.0–36.0)
MCV: 95.2 fL (ref 78.0–100.0)
Platelets: 189 10*3/uL (ref 150–400)
RBC: 4.2 MIL/uL — AB (ref 4.22–5.81)
RDW: 14.4 % (ref 11.5–15.5)
WBC: 11.7 10*3/uL — AB (ref 4.0–10.5)

## 2016-09-02 LAB — MAGNESIUM
MAGNESIUM: 2.1 mg/dL (ref 1.7–2.4)
Magnesium: 2.4 mg/dL (ref 1.7–2.4)
Magnesium: 2.3 mg/dL (ref 1.7–2.4)

## 2016-09-02 LAB — GLUCOSE, CAPILLARY
GLUCOSE-CAPILLARY: 105 mg/dL — AB (ref 65–99)
Glucose-Capillary: 119 mg/dL — ABNORMAL HIGH (ref 65–99)
Glucose-Capillary: 126 mg/dL — ABNORMAL HIGH (ref 65–99)
Glucose-Capillary: 129 mg/dL — ABNORMAL HIGH (ref 65–99)
Glucose-Capillary: 144 mg/dL — ABNORMAL HIGH (ref 65–99)
Glucose-Capillary: 154 mg/dL — ABNORMAL HIGH (ref 65–99)
Glucose-Capillary: 158 mg/dL — ABNORMAL HIGH (ref 65–99)

## 2016-09-02 LAB — TROPONIN I: Troponin I: 0.03 ng/mL (ref <0.03)

## 2016-09-02 MED ORDER — PRO-STAT SUGAR FREE PO LIQD
30.0000 mL | Freq: Two times a day (BID) | ORAL | Status: DC
  Administered 2016-09-02: 30 mL
  Filled 2016-09-02: qty 30

## 2016-09-02 MED ORDER — VITAL HIGH PROTEIN PO LIQD: 1000.0000 mL | ORAL | Status: DC

## 2016-09-02 MED ORDER — POTASSIUM CHLORIDE 20 MEQ/15ML (10%) PO SOLN
40.0000 meq | Freq: Once | ORAL | Status: AC
  Administered 2016-09-02: 40 meq
  Filled 2016-09-02: qty 30

## 2016-09-02 MED ORDER — DEXTROSE 5 % IV SOLN
INTRAVENOUS | Status: DC
  Administered 2016-09-02 – 2016-09-03 (×2): via INTRAVENOUS
  Filled 2016-09-02 (×4): qty 150

## 2016-09-02 MED ORDER — VITAL AF 1.2 CAL PO LIQD
1000.0000 mL | ORAL | Status: DC
  Administered 2016-09-02: 1000 mL

## 2016-09-02 NOTE — Progress Notes (Signed)
   RN called again concern over bradycardia but maintains patient is normotensive  12 lead EKG ordered - shows slow A Flutter  BP normal  K repleted  PLAN Monitor given normal BP  Dr. Kalman ShanMurali Jasha Hodzic, M.D., [facility].C.P Pulmonary and Critical Care Medicine Staff Physician [facility]  Pulmonary and Critical Care Pager: [phone], If no answer or between  15:00h - 7:00h: call [phone]  09/02/2016 3:10 AM

## 2016-09-02 NOTE — Consult Note (Addendum)
PULMONARY / CRITICAL CARE MEDICINE   Name: Adrian Melendez MRN: [id number] DOB: 09-30-1934    ADMISSION DATE:  08/27/2016 CONSULTATION DATE:  12/15  REFERRING MD:  Jomarie Longs   CHIEF COMPLAINT:  Acute resp failure and sepsis   HISTORY OF PRESENT ILLNESS:   81 year old male w/ sig h/o gout, CHF, CKD, afib/flutter and prior CVA. Admitted 12/8 w/ altered MS. Neuro was consulted. An MRI of brain was obtained and this showed multiple new strokes. He was also being treated for presumed UTI. On 12/15 PCCM was urgently consulted when he was found by RT unresponsive. Discussion w/ the medical team and family identified that he had been progressively lethargic, was placed on CPAP on 12/14 and over the course of the day on 12/15 had escalating O2 requirements up to 5 liters just to keep his sats > 90%. A STAT CXR revealed an new right sided PNA. He was transferred to the ICU for altered MS and intubation and treatment of severe sepsis.   SUBJECTIVE:  ETT Foley out, retained, foley re placed Pressors none No sedation, not responding  VITAL SIGNS: BP (!) 141/62   Pulse (!) 46   Temp 98.5 F (36.9 C) (Oral)   Resp 14   Ht 6' (1.829 m)   Wt 92.2 kg (203 lb 4.2 oz)   SpO2 100%   BMI 27.57 kg/m   HEMODYNAMICS:    VENTILATOR SETTINGS: Vent Mode: PRVC FiO2 (%):  [50 %-100 %] 50 % Set Rate:  [14 bmp] 14 bmp Vt Set:  [831 mL] 620 mL PEEP:  [5 cmH20] 5 cmH20 Plateau Pressure:  [19 cmH20] 19 cmH20  INTAKE / OUTPUT: I/O last 3 completed shifts: In: 4320 [I.V.:2240; Other:1000; NG/GT:30; IV Piggyback:1050] Out: 1025 [Urine:1025]  PHYSICAL EXAMINATION: General:  Acutely ill appearing. Calm on vent,. Not responsive Neuro: per rt limited reaction, left sluggish, moves all ext HEENT: jvd wnl, ett Cardiovascular:  s1 s2 IRR Lungs: ronchi rt greater left Abdomen:  Soft + bowel sounds, no r/g Musculoskeletal:  Generalized weakness  Skin:  Warm dry. Bilateral LE edema  -min  LABS:  BMET  Recent Labs Lab 09/01/16 0157 09/01/16 2332 09/02/16 0127  NA 142 145 145  K 3.7 3.5 3.8  CL 108 116* 115*  CO2 23 18* 16*  BUN 51* 63* 63*  CREATININE 1.88* 1.75* 1.70*  GLUCOSE 148* 138* 126*    Electrolytes  Recent Labs Lab 09/01/16 0157 09/01/16 2332 09/02/16 0127  CALCIUM 9.0 8.2* 8.4*  MG  --  2.1  --   PHOS  --  5.8*  --     CBC  Recent Labs Lab 08/31/16 0203 09/01/16 0157 09/02/16 0127  WBC 8.9 13.9* 11.7*  HGB 15.7 14.1 13.1  HCT 46.7 42.6 40.0  PLT 211 249 189    Coag's  Recent Labs Lab 08/27/16 0011  INR 2.32    Sepsis Markers  Recent Labs Lab 08/31/16 2031 09/01/16 1648 09/01/16 1955 09/02/16 0127  LATICACIDVEN 1.5 2.3* 2.2*  --   PROCALCITON  --  1.60  --  11.72    ABG  Recent Labs Lab 09/01/16 1610 09/02/16 0200 09/02/16 0525  PHART 7.385 7.443 7.402  PCO2ART 35.1 28.0* 30.7*  PO2ART 113* 97.8 87.3    Liver Enzymes No results for input(s): AST, ALT, ALKPHOS, BILITOT, ALBUMIN in the last 168 hours.  Cardiac Enzymes  Recent Labs Lab 08/29/16 1656 08/29/16 2131 09/01/16 2332  TROPONINI <0.03 0.03* 0.03*    Glucose  Recent Labs Lab 08/28/16 1525 09/01/16 1926 09/01/16 2350 09/02/16 0346 09/02/16 0756  GLUCAP 118* 115* 133* 126* 119*    Imaging Portable Chest Xray  Result Date: 09/01/2016 CLINICAL DATA:  Status post intubation EXAM: PORTABLE CHEST 1 VIEW COMPARISON:  09/01/2016 FINDINGS: Cardiac shadow is stable. Endotracheal tube is now seen in satisfactory position approximately 5.8 cm above the carina. Persistent right basilar infiltrate is noted. No acute bony abnormality is seen. IMPRESSION: Interval intubation in satisfactory position. The remainder of the exam is stable. Electronically Signed   By: Alcide CleverMark  Lukens M.D.   On: 09/01/2016 16:24   Dg Chest Port 1 View  Result Date: 09/01/2016 CLINICAL DATA:  Initial evaluation for increase hypoxia. EXAM: PORTABLE CHEST 1 VIEW  COMPARISON:  Prior radiograph from 08/31/2016. FINDINGS: Patient is markedly rotated to the right. Cardiomegaly grossly stable. Mediastinal silhouette grossly within normal limits. Lungs normally inflated. New parenchymal infiltrate within the right lower lobe, concerning for pneumonia. Probable mild involvement within the right upper lobe as well. Small right parapneumonic effusion. Left lung is clear. No pneumothorax. No acute osseous abnormality. IMPRESSION: 1. Interval development of right lower and upper lobe parenchymal infiltrates, concerning for pneumonia. 2. Small right parapneumonic effusion. Electronically Signed   By: Rise MuBenjamin  McClintock M.D.   On: 09/01/2016 14:03   Dg Abd Portable 1v  Result Date: 09/01/2016 CLINICAL DATA:  OG tube placement EXAM: PORTABLE ABDOMEN - 1 VIEW COMPARISON:  Chest x-ray 09/01/2016 FINDINGS: Esophageal tube tip is below the diaphragm, the tip and side-port project over the proximal stomach. Residual contrast in the lower bowel. Bilateral pleural effusions and bibasilar right greater than left consolidation. IMPRESSION: Esophageal tube tip projects over the proximal stomach Electronically Signed   By: Jasmine PangKim  Fujinaga M.D.   On: 09/01/2016 22:55  PCXR New aspiration PNA    STUDIES:  MRI brain 12/12>>>posterior bilateral strokes, pons, occipital rt post temp subacute  CULTURES: BC x2 12/14>>> UC 12/9: neg Sputum 12/15>>>  ANTIBIOTICS: Zosyn 12/15>>> vanc 12/15>>>  SIGNIFICANT EVENTS: 12/15- stokes, aspiration, ETT  LINES/TUBES: 12/15 ett>>>  DISCUSSION: 81 year old male admitted 12/8 w/ encephalopathy in setting of multiple acute strokes and presumed UTI. MS declining over last 24 hrs, requiring escalating oxygen on CPAP. PCCM asked to see on 12/15 when he was found unresponsive. Now w/what is likely aspiration PNA and resultant acute hypoxic resp failure and sepsis. Will move to ICU, intubate and add nosocomial abx coverage. Should his MS not improve  will need further CT imaging of his brain.   ASSESSMENT / PLAN:  PULMONARY A: Acute hypoxic Respiratory failure  Aspiration PNA vs HCAP  Not protecting airway  P:   ABG noted, keep same MV SBT planned, , strict apnea alarms and TV  With pons pcxr in am   CARDIOVASCULAR A:  H/o afib Severe Sepsis Huston FoleyBrady - neuro? P:  Volume assessment noninvasive on going Tele HR 58, but remains asymptomatic, if becomes symptomatic add dopamine In setting sub acute cva, MAP 75-80  RENAL A:   AKI NONAG P:  Chem in am  No free water  Saline consider 1 liter bicarb then back to saline  GASTROINTESTINAL A:   Dysphagia  P:   NPO Feed OGT PPI for SUP  HEMATOLOGIC A:   Leukocytosis  Chronic anticoagulation  P:  See ID Cont xarelto Repeat coags  INFECTIOUS A:   Aspiration pneumonia vs HCAP  Presumed UTI P:   UA noted Keep current abx  ENDOCRINE A:   Hyperglycemia  P:   Trend glucose   NEUROLOGIC A:   Acute encephalopathy-->likely d/t sepsis and hypercarbia  Acute bilateral posterior circulation embolic strokes P:   RASS goal: -1 Cont xarelto  If MS does not improve will need further CT imaging to r/o extension of stroke  Will call back stroke MD - do we need repeat MRi, neuro status down and out of proportion to sepsis alone or in settign cva TEE?  FAMILY  - Updates: family discussed plan of care at bedside. Daughter by me   - Inter-disciplinary family meet or Palliative Care meeting due by:12/22  Ccm time 35 min   Mcarthur RossettiDaniel J. Tyson AliasFeinstein, MD, FACP Pgr: [phone] [facility]

## 2016-09-02 NOTE — Progress Notes (Signed)
Patient transported to MRI and returned to unit without any complication. He is placed on his previous vent settings.

## 2016-09-02 NOTE — Progress Notes (Signed)
Stroke Team Progress Note   HISTORY AT TIME OF ADMISSION - 08/18/2016  Adrian Melendez is a 81 y.o. male with medical history significant for stroke, A. Fib, diastolic dysfunction, hypertension, migraines, presents to the emergency department with the chief complaint of difficulty urinating and 2 week history of gradual generalized weakness intermittent confusion. Initial evaluation reveals urinary retention, urinary tract infection, hypokalemia.  Information is obtained from the patient and his granddaughter who is at the bedside noting that information from patient is unreliable secondary to intermittent confusion. Granddaughter states since Thanksgiving patient having difficulty urinating. During that time he's gradually become lethargic and developed generalized weakness. I several days granddaughter reports intermittent episodes of "disorientation". No reports of any recent falls. She does report that he typically ambulates independently and has needed a cane the last couple days and today was unable to ambulate at all. Patient denies any abdominal pain back pain dysuria hematuria frequency or urgency. He denies chest pain palpitation shortness of breath headache dizziness syncope or near-syncope. No reports of any nausea or vomiting no diarrhea fever chills. He has been eating and drinking has been sleeping "more than usual". No hx prostate problems. Bedside bladder scan revealed greater than 999 mils.  Neurology signed off 08/30/2016, Wednesday. Dr Laney PotashFeinsrein asked Dr Pearlean BrownieSethi to see the patient again today for worsening neurologic deficits in the setting of pneumonia - possibly due to aspiration.    SUBJECTIVE Daughter and granddaughter at the bedside. The patient has been reintubated and recently received sedation for the procedure. At this time he is fairly unresponsive. Dr. Pearlean BrownieSethi explained that there could be multiple issues contributing to the patient's current condition; however, the only way  to know for sure if he has had another stroke would be to repeat an MRI. The MRI has been ordered for today.  OBJECTIVE Most recent Vital Signs: Temp: 98.5 F (36.9 C) (12/16 0800) Temp Source: Oral (12/16 0800) BP: 147/76 (12/16 1200) Pulse Rate: 48 (12/16 1200) Respiratory Rate: 14 O2 Saturdation: 100%  CBG (last 3)   Recent Labs  09/01/16 2350 09/02/16 0346 09/02/16 0756  GLUCAP 133* 126* 119*    Diet: Diet NPO time specified  liquids  Activity: Up with assistance   VTE Prophylaxis:   xarelto  Studies: Results for orders placed or performed during the hospital encounter of 15-Jul-2016 (from the past 24 hour(s))  Culture, respiratory (NON-Expectorated)     Status: None (Preliminary result)   Collection Time: 09/01/16  3:02 PM  Result Value Ref Range   Specimen Description TRACHEAL ASPIRATE    Special Requests NONE    Gram Stain      MODERATE WBC PRESENT, PREDOMINANTLY PMN FEW SQUAMOUS EPITHELIAL CELLS PRESENT MODERATE GRAM NEGATIVE RODS FEW GRAM POSITIVE COCCI IN CLUSTERS    Culture CULTURE REINCUBATED FOR BETTER GROWTH    Report Status PENDING   Glucose, capillary     Status: Abnormal   Collection Time: 09/01/16  3:37 PM  Result Value Ref Range   Glucose-Capillary 105 (H) 65 - 99 mg/dL   Comment 1 Notify RN    Comment 2 Document in Chart   Draw ABG 1 hour after initiation of ventilator     Status: Abnormal   Collection Time: 09/01/16  4:10 PM  Result Value Ref Range   FIO2 100.00    Delivery systems VENTILATOR    Mode PRESSURE REGULATED VOLUME CONTROL    VT 620 mL   LHR 14 resp/min   Peep/cpap 5.0 cm H20  pH, Arterial 7.385 7.350 - 7.450   pCO2 arterial 35.1 32.0 - 48.0 mmHg   pO2, Arterial 113 (H) 83.0 - 108.0 mmHg   Bicarbonate 20.5 20.0 - 28.0 mmol/L   Acid-base deficit 3.6 (H) 0.0 - 2.0 mmol/L   O2 Saturation 97.5 %   Patient temperature 98.6    Collection site LEFT RADIAL    Drawn by [phone]    Sample type ARTERIAL DRAW    Allens test  (pass/fail) PASS PASS  Procalcitonin - Baseline     Status: None   Collection Time: 09/01/16  4:48 PM  Result Value Ref Range   Procalcitonin 1.60 ng/mL  Lactic acid, plasma     Status: Abnormal   Collection Time: 09/01/16  4:48 PM  Result Value Ref Range   Lactic Acid, Venous 2.3 (HH) 0.5 - 1.9 mmol/L  Glucose, capillary     Status: Abnormal   Collection Time: 09/01/16  7:26 PM  Result Value Ref Range   Glucose-Capillary 115 (H) 65 - 99 mg/dL   Comment 1 Notify RN    Comment 2 Document in Chart   Lactic acid, plasma     Status: Abnormal   Collection Time: 09/01/16  7:55 PM  Result Value Ref Range   Lactic Acid, Venous 2.2 (HH) 0.5 - 1.9 mmol/L  Basic metabolic panel     Status: Abnormal   Collection Time: 09/01/16 11:32 PM  Result Value Ref Range   Sodium 145 135 - 145 mmol/L   Potassium 3.5 3.5 - 5.1 mmol/L   Chloride 116 (H) 101 - 111 mmol/L   CO2 18 (L) 22 - 32 mmol/L   Glucose, Bld 138 (H) 65 - 99 mg/dL   BUN 63 (H) 6 - 20 mg/dL   Creatinine, Ser 0.45 (H) 0.61 - 1.24 mg/dL   Calcium 8.2 (L) 8.9 - 10.3 mg/dL   GFR calc non Af Amer 35 (L) >60 mL/min   GFR calc Af Amer 41 (L) >60 mL/min   Anion gap 11 5 - 15  Magnesium     Status: None   Collection Time: 09/01/16 11:32 PM  Result Value Ref Range   Magnesium 2.1 1.7 - 2.4 mg/dL  Phosphorus     Status: Abnormal   Collection Time: 09/01/16 11:32 PM  Result Value Ref Range   Phosphorus 5.8 (H) 2.5 - 4.6 mg/dL  Troponin I     Status: Abnormal   Collection Time: 09/01/16 11:32 PM  Result Value Ref Range   Troponin I 0.03 (HH) <0.03 ng/mL  Glucose, capillary     Status: Abnormal   Collection Time: 09/01/16 11:50 PM  Result Value Ref Range   Glucose-Capillary 133 (H) 65 - 99 mg/dL   Comment 1 Notify RN    Comment 2 Document in Chart   Procalcitonin     Status: None   Collection Time: 09/02/16  1:27 AM  Result Value Ref Range   Procalcitonin 11.72 ng/mL  CBC     Status: Abnormal   Collection Time: 09/02/16  1:27 AM   Result Value Ref Range   WBC 11.7 (H) 4.0 - 10.5 K/uL   RBC 4.20 (L) 4.22 - 5.81 MIL/uL   Hemoglobin 13.1 13.0 - 17.0 g/dL   HCT 40.9 81.1 - 91.4 %   MCV 95.2 78.0 - 100.0 fL   MCH 31.2 26.0 - 34.0 pg   MCHC 32.8 30.0 - 36.0 g/dL   RDW 78.2 95.6 - 21.3 %   Platelets 189 150 - 400  K/uL  Basic metabolic panel     Status: Abnormal   Collection Time: 09/02/16  1:27 AM  Result Value Ref Range   Sodium 145 135 - 145 mmol/L   Potassium 3.8 3.5 - 5.1 mmol/L   Chloride 115 (H) 101 - 111 mmol/L   CO2 16 (L) 22 - 32 mmol/L   Glucose, Bld 126 (H) 65 - 99 mg/dL   BUN 63 (H) 6 - 20 mg/dL   Creatinine, Ser 4.541.70 (H) 0.61 - 1.24 mg/dL   Calcium 8.4 (L) 8.9 - 10.3 mg/dL   GFR calc non Af Amer 37 (L) >60 mL/min   GFR calc Af Amer 42 (L) >60 mL/min   Anion gap 14 5 - 15  Blood gas, arterial     Status: Abnormal   Collection Time: 09/02/16  2:00 AM  Result Value Ref Range   FIO2 70.00    Delivery systems VENTILATOR    Mode PRESSURE REGULATED VOLUME CONTROL    VT 620 mL   LHR 14 resp/min   Peep/cpap 5.0 cm H20   pH, Arterial 7.443 7.350 - 7.450   pCO2 arterial 28.0 (L) 32.0 - 48.0 mmHg   pO2, Arterial 97.8 83.0 - 108.0 mmHg   Bicarbonate 18.9 (L) 20.0 - 28.0 mmol/L   Acid-base deficit 4.5 (H) 0.0 - 2.0 mmol/L   O2 Saturation 97.3 %   Patient temperature 98.6    Collection site LEFT RADIAL    Drawn by [phone]    Sample type ARTERIAL DRAW    Allens test (pass/fail) PASS PASS  Glucose, capillary     Status: Abnormal   Collection Time: 09/02/16  3:46 AM  Result Value Ref Range   Glucose-Capillary 126 (H) 65 - 99 mg/dL   Comment 1 Notify RN    Comment 2 Document in Chart   Blood gas, arterial     Status: Abnormal   Collection Time: 09/02/16  5:25 AM  Result Value Ref Range   FIO2 60.00    Delivery systems VENTILATOR    Mode PRESSURE REGULATED VOLUME CONTROL    VT 620 mL   LHR 14 resp/min   Peep/cpap 5.0 cm H20   pH, Arterial 7.402 7.350 - 7.450   pCO2 arterial 30.7 (L) 32.0 - 48.0  mmHg   pO2, Arterial 87.3 83.0 - 108.0 mmHg   Bicarbonate 18.7 (L) 20.0 - 28.0 mmol/L   Acid-base deficit 5.2 (H) 0.0 - 2.0 mmol/L   O2 Saturation 95.9 %   Patient temperature 98.6    Collection site LEFT RADIAL    Drawn by [phone]    Sample type ARTERIAL DRAW    Allens test (pass/fail) PASS PASS  Glucose, capillary     Status: Abnormal   Collection Time: 09/02/16  7:56 AM  Result Value Ref Range   Glucose-Capillary 119 (H) 65 - 99 mg/dL   Comment 1 Notify RN    Comment 2 Document in Chart      Imaging   MRI Brain Wo Contrast 09/02/2016 Pending    MRI Brain Wo Contrast 08/29/2016 Several small foci of diffusion restriction within pons, bilateral occipital lobes, scattered in right posterior temporal lobe, and in the right thalamus compatible with acute/ early subacute infarction. No significant mass effect. No associated hemorrhage.  .   Portable Chest Xray 09/01/2016 Interval intubation in satisfactory position. The remainder of the exam is stable.    Dg Chest Port 1 View 09/01/2016 1. Interval development of right lower and upper lobe parenchymal infiltrates,  concerning for pneumonia.  2. Small right parapneumonic effusion.    Dg Chest Port 1 View 08/31/2016 1. Shallow lung inflation with mild bibasilar atelectasis/scarring, similar to previous.  2. Stable cardiomegaly without pulmonary edema.  3. Aortic atherosclerosis.    Dg Abd Portable 1v 09/01/2016 Esophageal tube tip projects over the proximal stomach     Physical Exam:    Frail elderly Caucasian male currently not in distress. . Afebrile. Head is nontraumatic. Neck is supple without bruit.    Cardiac exam no murmur or gallop. Lungs are clear to auscultation. Distal pulses are well felt. Neurological Exam :  Patient is  stuporose   And arouses minimally to sternal rub.Pupils equal sluggishlyreactive.corneal reflex is diminished on the right hand present on the left. Eyes are disconjugate with  hypertropia of right eye No blink to threat bilaterally. Face symmetric. Tongue midline. Motor system exam able to move all 4 extremities to noxious stimuli but lower extremities greater than upper and left more than right side.d.eep tendon pulses are symmetric. Sensation is intact. Plantars are downgoing. Gait was not tested.   ASSESSMENT Adrian Melendez is a 81 y.o. male with  tiny bilateral posterior circulation embolic infarcts from atrial flutter fibrillation despite being on anticoagulation with [facility] day # 7 Patient has had respiratory distress and neurological worsening likely due to aspiration pneumonia but development of new interval cerebral infarcts is also a possibility. Prognosis is guarded  PLAN  Await repeat MRI  Atrial fibrillation - Xarelto resumed Friday 09/01/2016   Pneumonia - ? Aspiration - currently day #2 - Zosyn and vancomycin. (WBCs 13.9 -> 11.7)  (Tmax today - 99.8)  Acute renal failure - BUN 63 ; creatinine 1.70  ; GFR 37   Delton See PA-C [facility] Pager [phone] 09/02/2016, 12:50 PM I have personally examined this patient, reviewed notes, independently viewed imaging studies, participated in medical decision making and plan of care.ROS completed by me personally and pertinent positives fully documented  I have made any additions or clarifications directly to the above note. Agree with note above.the patient has unfortunately declined neurologically age and the setting of respirator failure from aspiration pneumonia. Repeat MRI.ordered. Prognosis is guarded. I had a long discussion with the  daughter about his prognosis. She is realistic and may not want prolonged ventilayory support,, tracheostomy, PEG tube or nursing home. Discuss with Dr. Tyson Alias. This patient is critically ill and at significant risk of neurological worsening, death and care requires constant monitoring of vital signs, hemodynamics,respiratory and  cardiac monitoring, extensive review of multiple databases, frequent neurological assessment, discussion with family, other specialists and medical decision making of high complexity.I have made any additions or clarifications directly to the above note.This critical care time does not reflect procedure time, or teaching time or supervisory time of PA/NP/Med Resident etc but could involve care discussion time.  I spent 40 minutes of neurocritical care time  in the care of  this patient.     Delia Heady, MD Medical Director [facility] Pager: [phone] 09/02/2016 4:12 PM

## 2016-09-03 ENCOUNTER — Inpatient Hospital Stay (HOSPITAL_COMMUNITY): Payer: Medicare Other

## 2016-09-03 DIAGNOSIS — Z66 Do not resuscitate: Secondary | ICD-10-CM

## 2016-09-03 DIAGNOSIS — R338 Other retention of urine: Secondary | ICD-10-CM

## 2016-09-03 DIAGNOSIS — Z515 Encounter for palliative care: Secondary | ICD-10-CM

## 2016-09-03 LAB — COMPREHENSIVE METABOLIC PANEL
ALT: 44 U/L (ref 17–63)
AST: 56 U/L — AB (ref 15–41)
Albumin: 2.3 g/dL — ABNORMAL LOW (ref 3.5–5.0)
Alkaline Phosphatase: 71 U/L (ref 38–126)
Anion gap: 13 (ref 5–15)
BILIRUBIN TOTAL: 0.7 mg/dL (ref 0.3–1.2)
BUN: 51 mg/dL — AB (ref 6–20)
CALCIUM: 8.2 mg/dL — AB (ref 8.9–10.3)
CHLORIDE: 116 mmol/L — AB (ref 101–111)
CO2: 22 mmol/L (ref 22–32)
CREATININE: 1.44 mg/dL — AB (ref 0.61–1.24)
GFR, EST AFRICAN AMERICAN: 52 mL/min — AB (ref >60)
GFR, EST NON AFRICAN AMERICAN: 45 mL/min — AB (ref >60)
Glucose, Bld: 134 mg/dL — ABNORMAL HIGH (ref 65–99)
Potassium: 3.5 mmol/L (ref 3.5–5.1)
Sodium: 151 mmol/L — ABNORMAL HIGH (ref 135–145)
TOTAL PROTEIN: 5.1 g/dL — AB (ref 6.5–8.1)

## 2016-09-03 LAB — GLUCOSE, CAPILLARY
GLUCOSE-CAPILLARY: 159 mg/dL — AB (ref 65–99)
Glucose-Capillary: 157 mg/dL — ABNORMAL HIGH (ref 65–99)
Glucose-Capillary: 151 mg/dL — ABNORMAL HIGH (ref 65–99)

## 2016-09-03 LAB — CBC WITH DIFFERENTIAL/PLATELET
BASOS ABS: 0 10*3/uL (ref 0.0–0.1)
BASOS PCT: 0 %
EOS ABS: 0 10*3/uL (ref 0.0–0.7)
EOS PCT: 0 %
HCT: 39.9 % (ref 39.0–52.0)
HEMOGLOBIN: 12.8 g/dL — AB (ref 13.0–17.0)
LYMPHS ABS: 1.2 10*3/uL (ref 0.7–4.0)
Lymphocytes Relative: 13 %
MCH: 30.9 pg (ref 26.0–34.0)
MCHC: 32.1 g/dL (ref 30.0–36.0)
MCV: 96.4 fL (ref 78.0–100.0)
Monocytes Absolute: 0.9 10*3/uL (ref 0.1–1.0)
Monocytes Relative: 10 %
NEUTROS PCT: 77 %
Neutro Abs: 7.2 10*3/uL (ref 1.7–7.7)
PLATELETS: 178 10*3/uL (ref 150–400)
RBC: 4.14 MIL/uL — AB (ref 4.22–5.81)
RDW: 14.6 % (ref 11.5–15.5)
WBC: 9.3 10*3/uL (ref 4.0–10.5)

## 2016-09-03 LAB — PHOSPHORUS: PHOSPHORUS: 3.1 mg/dL (ref 2.5–4.6)

## 2016-09-03 LAB — MAGNESIUM: MAGNESIUM: 2.2 mg/dL (ref 1.7–2.4)

## 2016-09-03 LAB — PROTIME-INR
INR: 1.69
Prothrombin Time: 20.1 seconds — ABNORMAL HIGH (ref 11.4–15.2)

## 2016-09-03 LAB — PROCALCITONIN: PROCALCITONIN: 8.58 ng/mL

## 2016-09-03 LAB — APTT: aPTT: 41 seconds — ABNORMAL HIGH (ref 24–36)

## 2016-09-03 MED ORDER — CEFTRIAXONE SODIUM 1 G IJ SOLR
1.0000 g | INTRAMUSCULAR | Status: DC
  Administered 2016-09-03: 1 g via INTRAVENOUS
  Filled 2016-09-03 (×2): qty 10

## 2016-09-03 MED ORDER — MORPHINE SULFATE 50 MG/ML IV SOLN
10.0000 mg/h | INTRAVENOUS | Status: DC
  Administered 2016-09-03: 10 mg/h via INTRAVENOUS
  Filled 2016-09-03: qty 10

## 2016-09-03 MED ORDER — MORPHINE BOLUS VIA INFUSION
5.0000 mg | INTRAVENOUS | Status: DC | PRN
  Filled 2016-09-03: qty 20

## 2016-09-04 LAB — CULTURE, RESPIRATORY: CULTURE: NORMAL

## 2016-09-04 LAB — CULTURE, RESPIRATORY W GRAM STAIN

## 2016-09-05 ENCOUNTER — Telehealth: Payer: Self-pay

## 2016-09-05 NOTE — Telephone Encounter (Signed)
On 09/05/2016 I received a death certificate from [facility] ([facility] [place] The death certificate is for cremation. The patient is a patient of Doctor Tyson AliasFeinstein. [facility] this am for signature.  On 09/08/2016 I received the death certificate back from Doctor WashamNestor. I got the death certificate ready and called the funeral home to let them know the death certificate is ready for pickup. I also faxed a copy to the funeral home per the funeral home request.

## 2016-09-06 LAB — CULTURE, BLOOD (ROUTINE X 2)
Culture: NO GROWTH
Culture: NO GROWTH

## 2016-09-18 NOTE — Discharge Summary (Signed)
NAME:  [name], Adrian Melendez            ACCOUNT NO.:  [id number]  MEDICAL RECORD NO.:  [id number]  LOCATION:  2M06C                        FACILITY:  [facility]  PHYSICIAN:  Nelda Bucksaniel J Feinstein, MD DATE OF BIRTH:  05-12-1935  DATE OF ADMISSION:  01-20-16 DATE OF DISCHARGE:                              DISCHARGE SUMMARY   DEATH SUMMARY  HISTORY/HOSPITAL COURSE:  This is a 82 year old, pretty active white male with significant history of gout, CHF, CKD, atrial fibrillation, atrial flutter, with prior history of stroke, admitted on August 25, 2016, with altered mental status.  Neurology was consulted at that time MRI of the brain obtained, showed multiple new strokes.  He was also being treated for presumed urinary tract infection.  On September 01, 2016, Critical Care was urgently consulted and he was found to be in unresponsive status with clearcut aspiration, requiring intubation.  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  Comfort care was initiated after discussions with Neurology and Critical Care and the patient expired.  FINAL DIAGNOSES UPON DEATH: 1. Acute extensive bilateral posterior circulation infarcts including     thalamic, temporal, occipital lobes, brainstem, and cerebellum. 2. Acute respiratory failure. 3. Acute aspiration pneumonia. 4. Urinary tract infection. 5. Hypotension secondary to sepsis.     Nelda Bucksaniel J Feinstein, MD     DJF/MEDQ  D:  09/06/2016  T:  09/07/2016  Job:  [id number]

## 2016-09-18 NOTE — Progress Notes (Signed)
Stroke Team Progress Note   HISTORY AT TIME OF ADMISSION - 09/07/2016  Adrian Melendez is a 82 y.o. male with medical history significant for stroke, A. Fib, diastolic dysfunction, hypertension, migraines, presents to the emergency department with the chief complaint of difficulty urinating and 2 week history of gradual generalized weakness intermittent confusion. Initial evaluation reveals urinary retention, urinary tract infection, hypokalemia.  Information is obtained from the patient and his granddaughter who is at the bedside noting that information from patient is unreliable secondary to intermittent confusion. Granddaughter states since Thanksgiving patient having difficulty urinating. During that time he's gradually become lethargic and developed generalized weakness. I several days granddaughter reports intermittent episodes of "disorientation". No reports of any recent falls. She does report that he typically ambulates independently and has needed a cane the last couple days and today was unable to ambulate at all. Patient denies any abdominal pain back pain dysuria hematuria frequency or urgency. He denies chest pain palpitation shortness of breath headache dizziness syncope or near-syncope. No reports of any nausea or vomiting no diarrhea fever chills. He has been eating and drinking has been sleeping "more than usual". No hx prostate problems. Bedside bladder scan revealed greater than 999 mils.  Neurology signed off 08/30/2016, Wednesday. Dr Laney Potash asked Dr Pearlean Brownie to see the patient again 09/02/16 for worsening neurologic deficits in the setting of pneumonia - possibly due to aspiration.    SUBJECTIVE No family member is at the bedside. MRI shows Medial extensive bilateral posterior circulation infarcts involving thalamus, temporal occipital lobes, brainstem and cerebellum. Slow flow in the left vertebral and basilar artery may suggest possible occlusion. His neurological exam remains  poor with minimum responsiveness. OBJECTIVE Most recent Vital Signs: Temp: 102.2 F (39 C) (12/17 0803) Temp Source: Oral (12/17 0803) BP: 156/66 (12/17 0700) Pulse Rate: 73 (12/17 0700) Respiratory Rate: 14 O2 Saturdation: 96%  CBG (last 3)   Recent Labs  09/02/16 2021 09/02/16 2357 2016/09/08 0424  GLUCAP 154* 144* 157*    Diet: Diet NPO time specified  liquids  Activity: Up with assistance   VTE Prophylaxis:   xarelto  Studies: Results for orders placed or performed during the hospital encounter of 08/26/2016 (from the past 24 hour(s))  Magnesium     Status: None   Collection Time: 09/02/16 10:16 AM  Result Value Ref Range   Magnesium 2.4 1.7 - 2.4 mg/dL  Phosphorus     Status: None   Collection Time: 09/02/16 10:16 AM  Result Value Ref Range   Phosphorus 4.4 2.5 - 4.6 mg/dL  Glucose, capillary     Status: Abnormal   Collection Time: 09/02/16 12:23 PM  Result Value Ref Range   Glucose-Capillary 129 (H) 65 - 99 mg/dL   Comment 1 Notify RN    Comment 2 Document in Chart   Magnesium     Status: None   Collection Time: 09/02/16  4:29 PM  Result Value Ref Range   Magnesium 2.3 1.7 - 2.4 mg/dL  Phosphorus     Status: None   Collection Time: 09/02/16  4:29 PM  Result Value Ref Range   Phosphorus 3.2 2.5 - 4.6 mg/dL  Glucose, capillary     Status: Abnormal   Collection Time: 09/02/16  4:35 PM  Result Value Ref Range   Glucose-Capillary 158 (H) 65 - 99 mg/dL   Comment 1 Notify RN    Comment 2 Document in Chart   Glucose, capillary     Status: Abnormal   Collection  Time: 09/02/16  8:21 PM  Result Value Ref Range   Glucose-Capillary 154 (H) 65 - 99 mg/dL   Comment 1 Notify RN   Glucose, capillary     Status: Abnormal   Collection Time: 09/02/16 11:57 PM  Result Value Ref Range   Glucose-Capillary 144 (H) 65 - 99 mg/dL   Comment 1 Notify RN   Glucose, capillary     Status: Abnormal   Collection Time: 2016-08-10  4:24 AM  Result Value Ref Range    Glucose-Capillary 157 (H) 65 - 99 mg/dL   Comment 1 Notify RN   Procalcitonin     Status: None   Collection Time: 2016-08-10  5:15 AM  Result Value Ref Range   Procalcitonin 8.58 ng/mL  Magnesium     Status: None   Collection Time: 2016-08-10  5:15 AM  Result Value Ref Range   Magnesium 2.2 1.7 - 2.4 mg/dL  Phosphorus     Status: None   Collection Time: 2016-08-10  5:15 AM  Result Value Ref Range   Phosphorus 3.1 2.5 - 4.6 mg/dL  APTT     Status: Abnormal   Collection Time: 2016-08-10  5:15 AM  Result Value Ref Range   aPTT 41 (H) 24 - 36 seconds  Protime-INR     Status: Abnormal   Collection Time: 2016-08-10  5:15 AM  Result Value Ref Range   Prothrombin Time 20.1 (H) 11.4 - 15.2 seconds   INR 1.69   Comprehensive metabolic panel     Status: Abnormal   Collection Time: 2016-08-10  5:15 AM  Result Value Ref Range   Sodium 151 (H) 135 - 145 mmol/L   Potassium 3.5 3.5 - 5.1 mmol/L   Chloride 116 (H) 101 - 111 mmol/L   CO2 22 22 - 32 mmol/L   Glucose, Bld 134 (H) 65 - 99 mg/dL   BUN 51 (H) 6 - 20 mg/dL   Creatinine, Ser 6.961.44 (H) 0.61 - 1.24 mg/dL   Calcium 8.2 (L) 8.9 - 10.3 mg/dL   Total Protein 5.1 (L) 6.5 - 8.1 g/dL   Albumin 2.3 (L) 3.5 - 5.0 g/dL   AST 56 (H) 15 - 41 U/L   ALT 44 17 - 63 U/L   Alkaline Phosphatase 71 38 - 126 U/L   Total Bilirubin 0.7 0.3 - 1.2 mg/dL   GFR calc non Af Amer 45 (L) >60 mL/min   GFR calc Af Amer 52 (L) >60 mL/min   Anion gap 13 5 - 15  CBC with Differential/Platelet     Status: Abnormal   Collection Time: 2016-08-10  5:15 AM  Result Value Ref Range   WBC 9.3 4.0 - 10.5 K/uL   RBC 4.14 (L) 4.22 - 5.81 MIL/uL   Hemoglobin 12.8 (L) 13.0 - 17.0 g/dL   HCT 29.539.9 28.439.0 - 13.252.0 %   MCV 96.4 78.0 - 100.0 fL   MCH 30.9 26.0 - 34.0 pg   MCHC 32.1 30.0 - 36.0 g/dL   RDW 44.014.6 10.211.5 - 72.515.5 %   Platelets 178 150 - 400 K/uL   Neutrophils Relative % 77 %   Neutro Abs 7.2 1.7 - 7.7 K/uL   Lymphocytes Relative 13 %   Lymphs Abs 1.2 0.7 - 4.0 K/uL   Monocytes  Relative 10 %   Monocytes Absolute 0.9 0.1 - 1.0 K/uL   Eosinophils Relative 0 %   Eosinophils Absolute 0.0 0.0 - 0.7 K/uL   Basophils Relative 0 %   Basophils Absolute 0.0  0.0 - 0.1 K/uL     Imaging   MRI Brain Wo Contrast 09/02/2016 New slow flow or occluded vertebral arteries and basilar artery. New, acute extensive bilateral posterior circulation ablation infarcts  (thalami, temporal occipital lobes, brainstem and cerebellum) without hemorrhagic conversion. Multiple old small posterior circulation infarcts.    MRI Brain Wo Contrast 08/29/2016 Several small foci of diffusion restriction within pons, bilateral occipital lobes, scattered in right posterior temporal lobe, and in the right thalamus compatible with acute/ early subacute infarction. No significant mass effect. No associated hemorrhage.  .   Portable Chest Xray 09/01/2016 Interval intubation in satisfactory position. The remainder of the exam is stable.    Dg Chest Port 1 View 09/01/2016 1. Interval development of right lower and upper lobe parenchymal infiltrates, concerning for pneumonia.  2. Small right parapneumonic effusion.    Dg Chest Port 1 View 08/31/2016 1. Shallow lung inflation with mild bibasilar atelectasis/scarring, similar to previous.  2. Stable cardiomegaly without pulmonary edema.  3. Aortic atherosclerosis.    Dg Abd Portable 1v 09/01/2016 Esophageal tube tip projects over the proximal stomach     Physical Exam:    Frail elderly Caucasian male currently not in distress. . Afebrile. Head is nontraumatic. Neck is supple without bruit.    Cardiac exam no murmur or gallop. Lungs are clear to auscultation. Distal pulses are well felt. Neurological Exam :  Patient is  comatose   And does not arouse even to sternal rub.Pupils unequal  left pupil 3 mm sluggishly reactive and right pupil 6 mm and not reactive.corneal reflex is absent bilaterally Eyes are disconjugate with hypertropia of  right eye No blink to threat bilaterally. Face symmetric. Tongue midline. Motor system exam unable to move UE extremities to noxious stimuli but  reflex withdrawal of lower extremities   left more than right side.d.eep tendon pulses are symmetric.  [name]. Plantars are downgoing. Gait was not tested.   ASSESSMENT Adrian Melendez is a 82 y.o. male with  tiny bilateral posterior circulation embolic infarcts from atrial flutter fibrillation [facility]   Hospital day # 8 Patient has had respiratory distress and neurological worsening likely due to aspiration pneumonia and repeat MRI shows worsening and extension of brainstem and posterior circulation infarcts with diminished flow signal in left vertebral and basilar arteries. PLAN  Patient's prognosis is quite poor. Chances of survival even with prolonged ventilatory support her likely going to lead to a vegetative state and meaningful recovery is unlikely. Recommend withdrawal of care and comfort care measures only. Discussed with Dr. Tyson AliasFeinstein. I will discuss with daughter and family when they arrive later  Atrial fibrillation - Xarelto resumed Friday 09/01/2016   Pneumonia - ? Aspiration - currently day #2 - Zosyn and vancomycin. (WBCs 13.9 -> 11.7)  (Tmax today - 99.8)  Acute renal failure - BUN 63 ; creatinine 1.70  ; GFR 37    I have personally examined this patient, reviewed notes, independently viewed imaging studies, participated in medical decision making and plan of care.ROS completed by me personally and pertinent positives fully documented  I have made any additions or clarifications directly to the above note.  .The patient has unfortunately declined neurologically  in the setting of respirator failure from aspiration pneumonia. As well as development of new posterior circulation infarcts on  repeat MRI. [name]. Prognosis is very poor. With no realistic chance of survival with meaningful quality of life even  with prolonged life support which would  be inevitable at this point. I had a long discussion with the  daughter about his prognosis yesterday and she had indicated. that family  may not want prolonged ventilayory support,, tracheostomy, PEG tube or nursing home. Discuss with Dr. Tyson Alias. This patient is critically ill and at significant risk of neurological worsening, death and care requires constant monitoring of vital signs, hemodynamics,respiratory and cardiac monitoring, extensive review of multiple databases, frequent neurological assessment, discussion with family, other specialists and medical decision making of high complexity.I have made any additions or clarifications directly to the above note.This critical care time does not reflect procedure time, or teaching time or supervisory time of PA/NP/Med Resident etc but could involve care discussion time.  I spent 35 minutes of neurocritical care time  in the care of  this patient.    Delia Heady, MD Medical Director [facility] Pager: [phone] 09/23/2016 12:43 PM

## 2016-09-18 NOTE — Progress Notes (Signed)
eLink Physician-Brief Progress Note Patient Name: Adrian DrownWilliam R Melendez DOB: 03/01/1935 MRN: [id number]   Date of Service  2016-02-26  HPI/Events of Note  MRI results: New slow flow or occluded vertebral arteries and basilar artery. New, acute extensive bilateral posterior circulation ablation infarcts (thalami, temporal occipital lobes, brainstem and cerebellum) without hemorrhagic conversion. Multiple old small posterior circulation infarcts. Patient is already on Xarelto. Due to extensive nature of infarcts and concern for hemorrhagic conversion, spoke to neurologist on call, Dr. Otelia LimesLindzen, who advised against starting heparin at this time  eICU Interventions  Continue Xarelto therapy.      Intervention Category Intermediate Interventions: Diagnostic test evaluation  Sommer,Steven Eugene 2016-02-26, 2:10 AM

## 2016-09-18 NOTE — Consult Note (Addendum)
PULMONARY / CRITICAL CARE MEDICINE   Name: Mellody DrownWilliam R Lasure MRN: [id number] DOB: 04/09/1935    ADMISSION DATE:  09/02/2016 CONSULTATION DATE:  12/15  REFERRING MD:  Jomarie LongsJoseph   CHIEF COMPLAINT:  Acute resp failure and sepsis   HISTORY OF PRESENT ILLNESS:   82 year old male w/ sig h/o gout, CHF, CKD, afib/flutter and prior CVA. Admitted 12/8 w/ altered MS. Neuro was consulted. An MRI of brain was obtained and this showed multiple new strokes. He was also being treated for presumed UTI. On 12/15 PCCM was urgently consulted when he was found by RT unresponsive. Discussion w/ the medical team and family identified that he had been progressively lethargic, was placed on CPAP on 12/14 and over the course of the day on 12/15 had escalating O2 requirements up to 5 liters just to keep his sats > 90%. A STAT CXR revealed an new right sided PNA. He was transferred to the ICU for altered MS and intubation and treatment of severe sepsis.   SUBJECTIVE:  MRi horrible images of extensive stoke No change sin neuro exam, no response  VITAL SIGNS: BP (!) 156/66   Pulse 73   Temp 99.8 F (37.7 C) (Oral)   Resp 14   Ht 6' (1.829 m)   Wt 92.6 kg (204 lb 2.3 oz)   SpO2 96%   BMI 27.69 kg/m   HEMODYNAMICS:    VENTILATOR SETTINGS: Vent Mode: PRVC FiO2 (%):  [40 %-50 %] 40 % Set Rate:  [14 bmp] 14 bmp Vt Set:  [620 mL] 620 mL PEEP:  [5 cmH20] 5 cmH20 Plateau Pressure:  [14 cmH20-18 cmH20] 18 cmH20  INTAKE / OUTPUT: I/O last 3 completed shifts: In: 5134 [I.V.:2525.5; Other:1000; NG/GT:996; IV Piggyback:612.5] Out: 2575 [Urine:2575]  PHYSICAL EXAMINATION: General:  Acutely ill appearing. Calm on vent,. Not responsive Neuro: per less reactive, disconjigate HEENT: jvd wnl, ett Cardiovascular:  s1 s2 IRR, rate wnl Lungs: coarse rt base Abdomen:  Soft + bowel sounds, no r/g Musculoskeletal:  Generalized weakness  Skin:  Warm dry. Bilateral LE edema -min  LABS:  BMET  Recent Labs Lab  09/01/16 2332 09/02/16 0127 10-16-15 0515  NA 145 145 151*  K 3.5 3.8 3.5  CL 116* 115* 116*  CO2 18* 16* 22  BUN 63* 63* 51*  CREATININE 1.75* 1.70* 1.44*  GLUCOSE 138* 126* 134*    Electrolytes  Recent Labs Lab 09/01/16 2332 09/02/16 0127 09/02/16 1016 09/02/16 1629 10-16-15 0515  CALCIUM 8.2* 8.4*  --   --  8.2*  MG 2.1  --  2.4 2.3 2.2  PHOS 5.8*  --  4.4 3.2 3.1    CBC  Recent Labs Lab 09/01/16 0157 09/02/16 0127 10-16-15 0515  WBC 13.9* 11.7* 9.3  HGB 14.1 13.1 12.8*  HCT 42.6 40.0 39.9  PLT 249 189 178    Coag's  Recent Labs Lab 10-16-15 0515  APTT 41*  INR 1.69    Sepsis Markers  Recent Labs Lab 08/31/16 2031 09/01/16 1648 09/01/16 1955 09/02/16 0127  LATICACIDVEN 1.5 2.3* 2.2*  --   PROCALCITON  --  1.60  --  11.72    ABG  Recent Labs Lab 09/01/16 1610 09/02/16 0200 09/02/16 0525  PHART 7.385 7.443 7.402  PCO2ART 35.1 28.0* 30.7*  PO2ART 113* 97.8 87.3    Liver Enzymes  Recent Labs Lab 10-16-15 0515  AST 56*  ALT 44  ALKPHOS 71  BILITOT 0.7  ALBUMIN 2.3*    Cardiac Enzymes  Recent  Labs Lab 08/29/16 1656 08/29/16 2131 09/01/16 2332  TROPONINI <0.03 0.03* 0.03*    Glucose  Recent Labs Lab 09/02/16 0756 09/02/16 1223 09/02/16 1635 09/02/16 2021 09/02/16 2357 08/21/2016 0424  GLUCAP 119* 129* 158* 154* 144* 157*    Imaging [name]  Addendum Date: 08/26/2016   ADDENDUM REPORT: 09/11/2016 01:52 ADDENDUM: Critical Value/emergent results were called by telephone at the time of interpretation on 09/14/2016 at 1:45 am to Dr. Vaughan Basta, who verbally acknowledged these results. Electronically Signed   By: Awilda Metro M.D.   On: 09/09/2016 01:52   Result Date: 09/15/2016 CLINICAL DATA:  Evaluate for extension of recent stroke, worsening symptoms on ventilation. History of hypertension, migraines, septic shock EXAM: MRI HEAD WITHOUT CONTRAST TECHNIQUE: Multiplanar, multiecho pulse sequences of  the brain and surrounding structures were obtained without intravenous contrast. COMPARISON:  MRI head August 29, 2016. FINDINGS: BRAIN: New confluent reduced diffusion cerebral peduncle spot and pons, confluent reduced diffusion RIGHT greater than LEFT cerebellum, superimposed on small strokes in the posterior circulation. Confluent RIGHT greater than LEFT medial temporal occipital lobe and patchy bilateral thalamus reduced diffusion. All areas demonstrate low ADC values and patchy to bright T2 signal. Punctate susceptibility artifact are unchanged, no hemorrhagic conversion of stroke. Old small bilateral cerebellar infarcts. Old RIGHT basal ganglia lacunar infarct. Ventricles and sulci are normal for patient's age. Patchy supratentorial white matter FLAIR T2 hyperintensities compatible with moderate chronic small vessel ischemic disease, unchanged. No abnormal extra-axial fluid collections. VASCULAR: Loss of normal LEFT greater than RIGHT vertebral artery flow voids, loss of normal basilar artery flow void with new intravascular susceptibility artifact basilar artery to the level the basilar tip. Normal anterior circulation flow voids. SKULL AND UPPER CERVICAL SPINE: No abnormal sellar expansion. No suspicious calvarial bone marrow signal. Craniocervical junction maintained. SINUSES/ORBITS: Scattered paranasal sinus mucosal retention cyst. Layering secretions in the oral pharynx. Trace mastoid effusions. Status post bilateral ocular lens implants. The included ocular globes and orbital contents are non-suspicious. OTHER: None. IMPRESSION: New slow flow or occluded vertebral arteries and basilar artery. New, acute extensive bilateral posterior circulation ablation infarcts (thalami, temporal occipital lobes, brainstem and cerebellum) without hemorrhagic conversion. Multiple old small posterior circulation infarcts. Electronically Signed: By: Awilda Metro M.D. On: 09/02/2016 01:36   Dg Chest Port 1  View  Result Date: 09/12/2016 CLINICAL DATA:  Aspiration pneumonia. EXAM: PORTABLE CHEST 1 VIEW COMPARISON:  09/02/2016. FINDINGS: Stable heart size near the upper limit of normal and tortuous and calcified thoracic aorta. Nasogastric tube side hole in the proximal to mid stomach with the tip not included. Endotracheal tube in satisfactory position. Decreased right basilar airspace opacity. No significant change in left basilar airspace opacity. A small left pleural effusion is currently demonstrated. Thoracic spine degenerative changes. IMPRESSION: 1. Improving right basilar pneumonia or aspiration pneumonitis. 2. Stable left basilar pneumonia, aspiration pneumonitis or atelectasis. 3. Small left pleural effusion. 4. Aortic atherosclerosis. Electronically Signed   By: Beckie Salts M.D.   On: 09/07/2016 07:24  PCXR New aspiration PNA    STUDIES:  MRI brain 12/12>>>posterior bilateral strokes, pons, occipital rt post temp subacute MRI 12/16>>>New slow flow or occluded vertebral arteries and basilar artery. New, acute extensive bilateral posterior circulation ablation infarcts (thalami, temporal occipital lobes, brainstem and cerebellum) without hemorrhagic conversion. Multiple old small posterior circulation infarcts.  CULTURES: BC x2 12/14>>> UC 12/9: neg Sputum 12/15>>>  ANTIBIOTICS: Zosyn 12/15>>>12/17 vanc 12/15>>>12/17  SIGNIFICANT EVENTS: 12/15- stokes, aspiration, ETT  LINES/TUBES: 12/15 ett>>>  DISCUSSION:  82 year old male admitted 12/8 w/ encephalopathy in setting of multiple acute strokes and presumed UTI. MS declining over last 24 hrs, requiring escalating oxygen on CPAP. PCCM asked to see on 12/15 when he was found unresponsive. Now w/what is likely aspiration PNA and resultant acute hypoxic resp failure and sepsis. Will move to ICU, intubate and add nosocomial abx coverage. Should his MS not improve will need further CT imaging of his brain.   ASSESSMENT /  PLAN:  PULMONARY A: Acute hypoxic Respiratory failure  Aspiration PNA vs HCAP  Not protecting airway  P:   unlikley to wean, or breath over vent Await d/w family this am  Hold wean for now, if perfromed would need apnea alarms Will assess also for comfort care likely needs  CARDIOVASCULAR A:  H/o afib Severe Sepsis Huston FoleyBrady - neuro? P:  Will discuss dnr status Pressors would be futule In setting sub acute cva, MAP 75-80  RENAL A:   AKI NONAG P:  Chem in am  No free water Allow bicarb to dc then saline Allow na to rise 154  GASTROINTESTINAL A:   Dysphagia  P:   NPO Feed OGT- hold iffor comfort PPI for SUP  HEMATOLOGIC A:   Leukocytosis  Chronic anticoagulation  P:  See ID Cont xarelto per d/w neuro last night  INFECTIOUS A:   Aspiration pneumonia vs HCAP  Presumed UTI P:   UA noted Narrow to ceftriaxone  ENDOCRINE A:   Hyperglycemia  P:   Trend glucose   NEUROLOGIC A:   Acute encephalopathy-->likely d/t sepsis and hypercarbia  Acute bilateral posterior circulation embolic strokes - severe  New changes on MRI Will have a horrible prognosis for fxnal recovery  P:   RASS goal: -1 Cont xarelto  Would perform comfort care and dnr talks, will d/w wife/ daughter  FAMILY  - Updates: family discussion planned  - Inter-disciplinary family meet or Palliative Care meeting due by:12/22  Ccm time 35 min   Mcarthur Rossettianiel J. Tyson AliasFeinstein, MD, FACP Pgr: [phone] [facility]  I have had extensive discussions with family daughter and granddaughter (what he would have wanted and wife would agree). We discussed patients current circumstances and organ failures. We also discussed patient's prior wishes under circumstances such as this. Family has decided to NOT perform resuscitation if arrest but to continue current medical support for now. They will likely move to cmofort care today after wife arrives  Mcarthur RossettiDaniel J. Tyson AliasFeinstein, MD, FACP Pgr:  [phone] [facility] f

## 2016-09-18 NOTE — Procedures (Signed)
Extubation Procedure Note  Patient Details:   Name: Adrian DrownWilliam R Melendez DOB: 11/28/1934 MRN: [id number]   Airway Documentation:     Evaluation  O2 sats: stable throughout Complications: No apparent complications Patient did tolerate procedure well. Bilateral Breath Sounds: Rhonchi   No terminal extubation  Pt. Was terminally extubated to room air without any complications with RN at the bedside.     [name], [name] 09/07/2016, 3:50 PM

## 2016-09-18 NOTE — Progress Notes (Signed)
Pt extubated at 15:50, had shallow breathing for some time then stopped,and pronounced death at 16:15. Pronounced death by two RN's [employer]y & Melinda. Familly stepped out at the time of extubation as their wish. Came back for some time and went home. All needed info in post mortem flow sheet. Cleaned the body, removed all lines and took to morgue. No concerns.  Danne HarborJohny,RN

## 2016-09-18 NOTE — Progress Notes (Addendum)
Morphine 130mg  wasted in sink. JohnyRN & Merchant navy officerMelinda RN.  OmnicareJohny RN

## 2016-09-18 NOTE — Progress Notes (Signed)
Dr.Sethi has explained to the family (pt's wife, daughter, son in law and grand daughter) about pt's condition and prognosis. After that family- wife made the decision to keep him on comfort care and verbally informed me that they are ready for his terminal extubation now. No other family to visit at this time. Informed Dr.Feinstein and received the orders. Initiated Morphine drip.    Danne HarborJohny,RN

## 2016-09-18 DEATH — deceased

## 2016-09-21 ENCOUNTER — Ambulatory Visit: Payer: Medicare Other

## 2017-01-30 ENCOUNTER — Ambulatory Visit: Payer: Medicare Other | Admitting: Neurology

## 2017-09-24 IMAGING — US US RENAL
1 series · 14 of 25 positions shown · non-contrast
Comparison: None.

CLINICAL DATA: Urinary retention

EXAM:
RENAL / URINARY TRACT ULTRASOUND COMPLETE

[Series 1: us renal · 0.28mm/px · 14 of 39 slices shown]
[im 1/39]
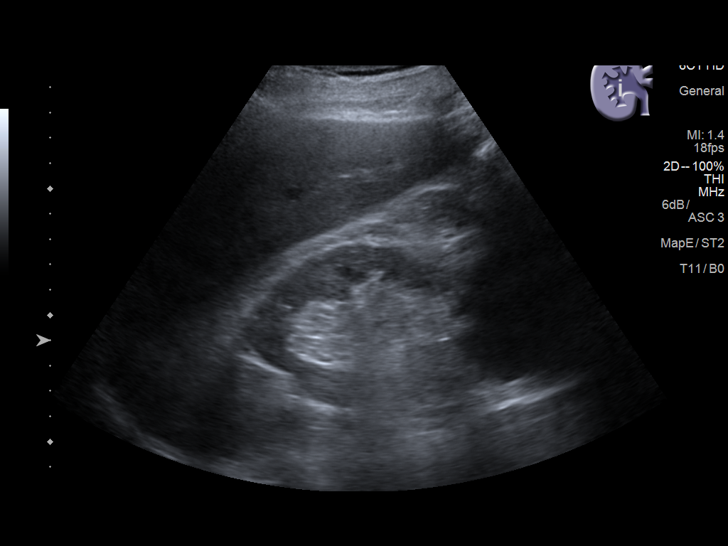
[im 4/39]
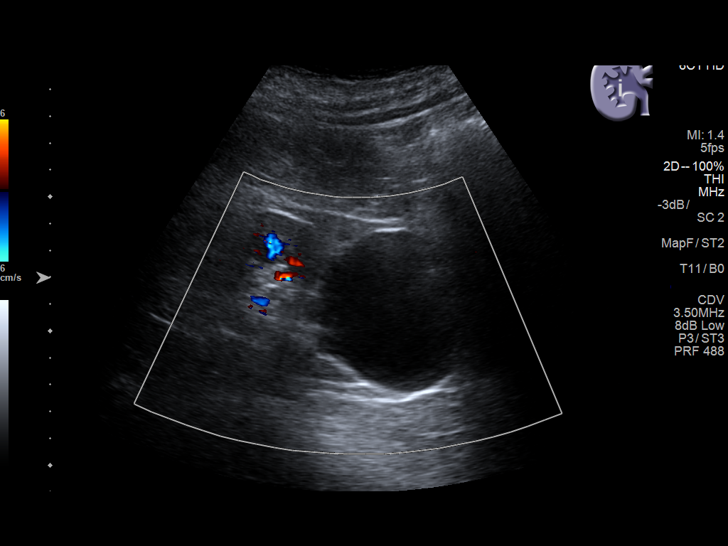
[im 7/39]
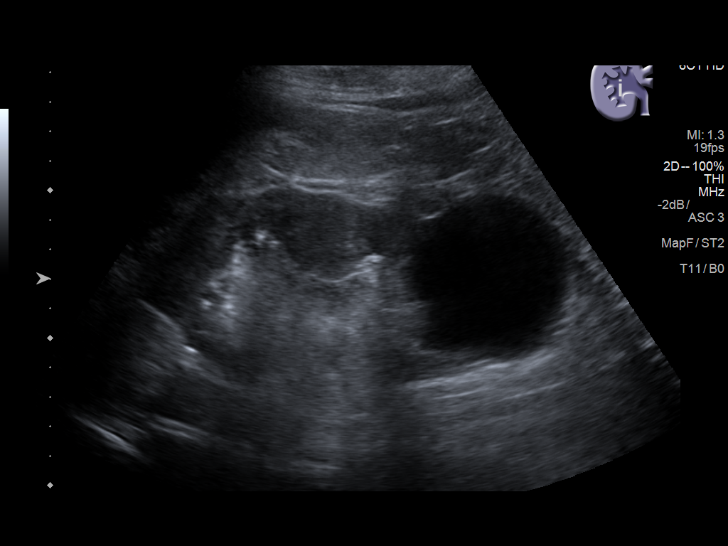
[im 10/39]
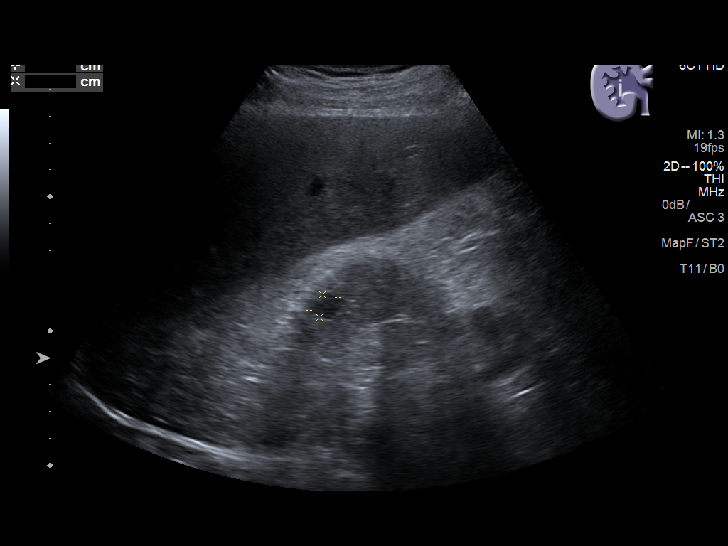
[im 13/39]
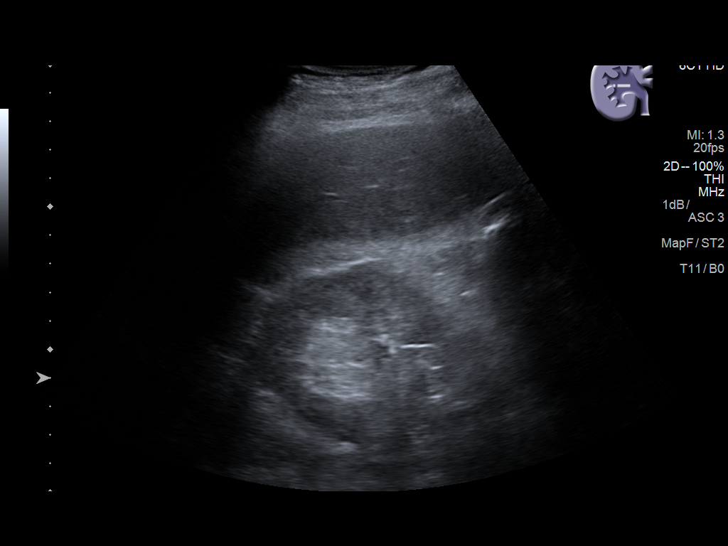
[im 15/39]
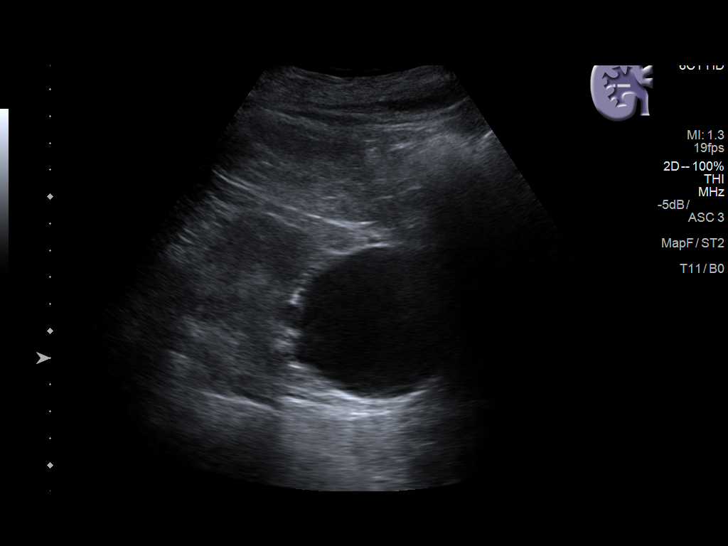
[im 18/39]
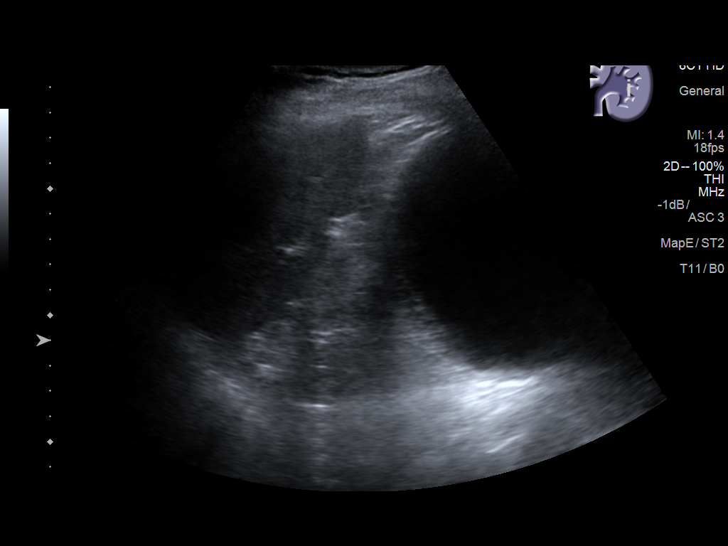
[im 21/39]
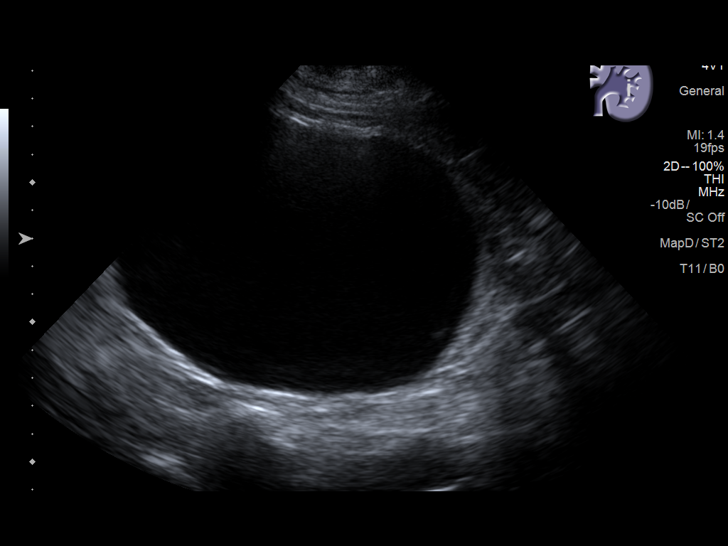
[im 24/39]
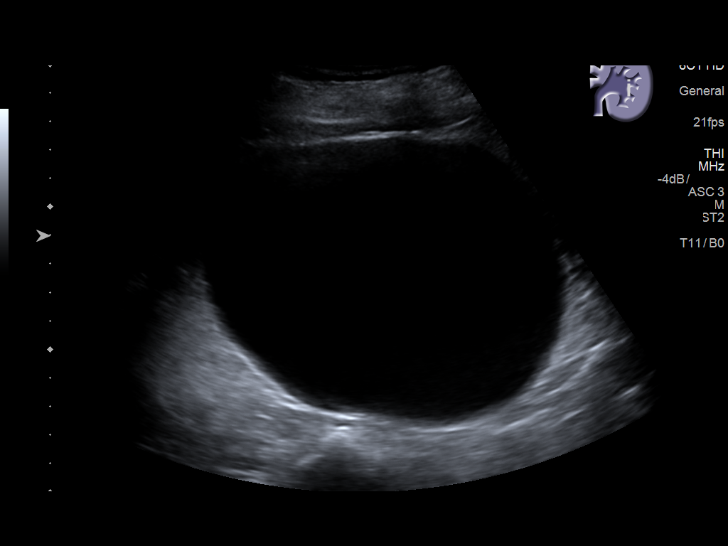
[im 26/39]
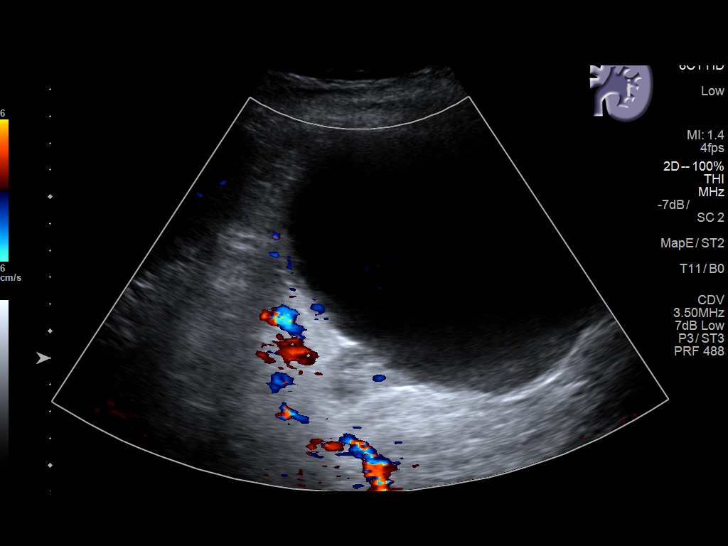
[im 29/39]
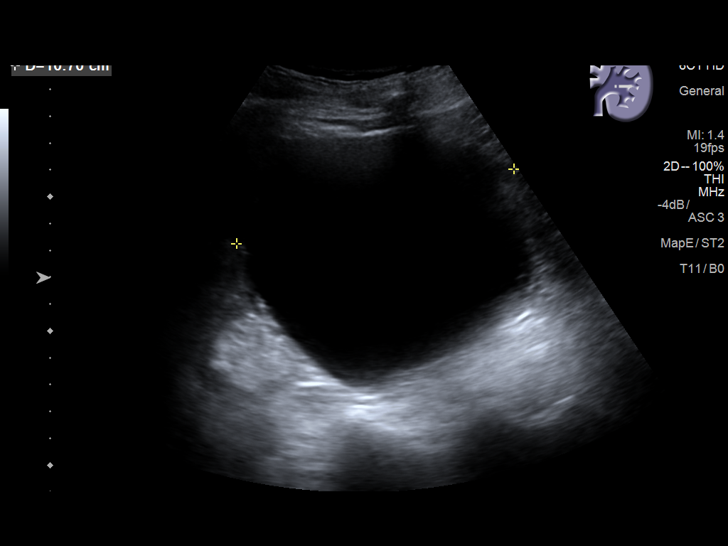
[im 32/39]
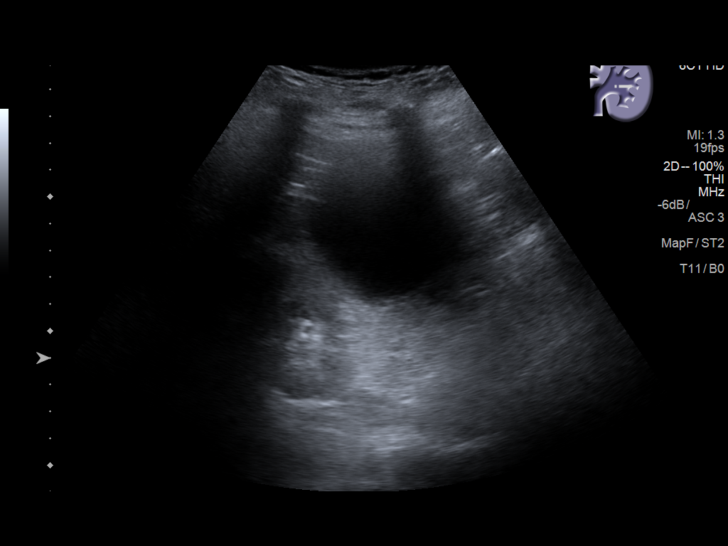
[im 35/39]
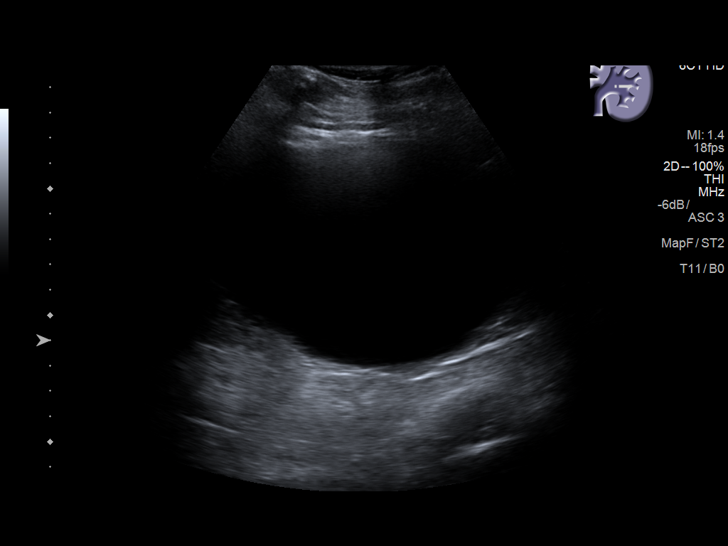
[im 39/39]
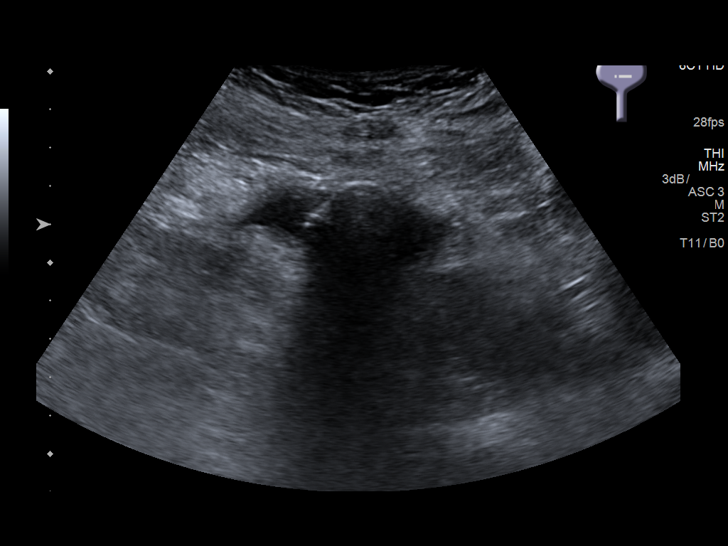

[14 of 25 positions shown; findings below may reference images not displayed]

FINDINGS: Right Kidney:

Length: 15 cm. Renal enlargement secondary to cystic change. 2
simple appearing cysts are noted, up to 7 cm in the lower pole. No
hydronephrosis. No solid mass.

Left Kidney:

Length: 17 cm. Enlargement is secondary to a 13 cm lower pole cyst.
No hydronephrosis. No solid mass.

Bladder:

Completely decompressed by Foley catheter.
IMPRESSION: 1. No hydronephrosis. The bladder is completely decompressed by
Foley catheter.
2. Bilateral renal cysts measuring up to 13 cm on the left.

## 2017-12-27 IMAGING — CT CT HEAD W/O CM
4 of 7 series · 15 of 47 positions shown, 17 images · non-contrast
Comparison: CT head 01/12/2016

CLINICAL DATA: Family states over the last two weeks the patient
has become very lethargic and his legs very weak. Patient is
normally able to ambulate with a walker but the family states they
have had difficulty getting the patient to ambulate.

EXAM:
CT HEAD WITHOUT CONTRAST
TECHNIQUE: Contiguous axial images were obtained from the base of the skull
through the vertex without intravenous contrast.

[Series 2: head 5.0 h30s · axial · 0.42mm/px · z∈[-113,+12]mm · 7 of 35 slices shown, 9 images]
[im 5/35  brain]
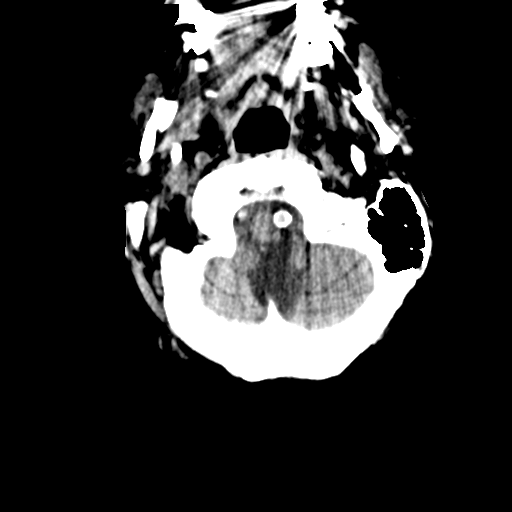
[im 5/35  bone]
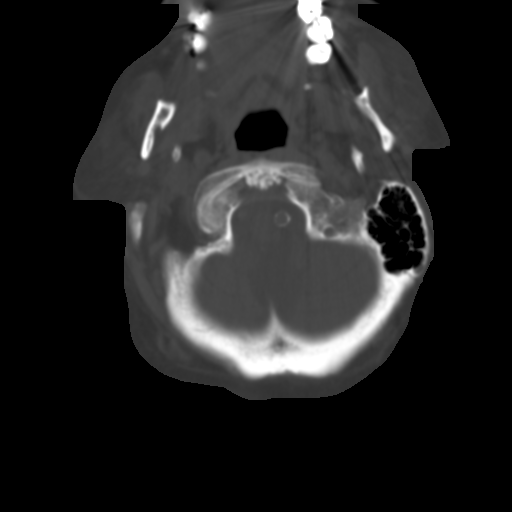
[im 9/35  brain]
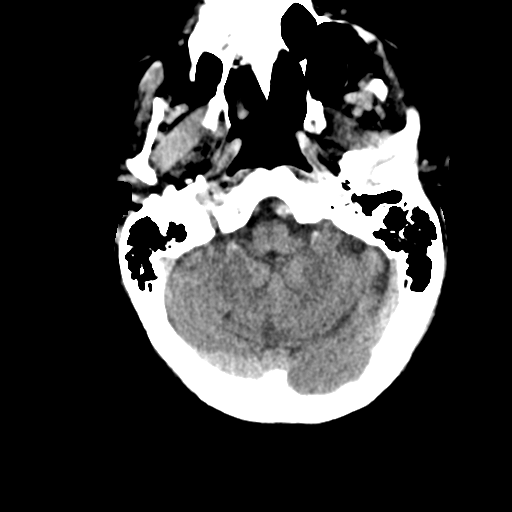
[im 13/35  brain]
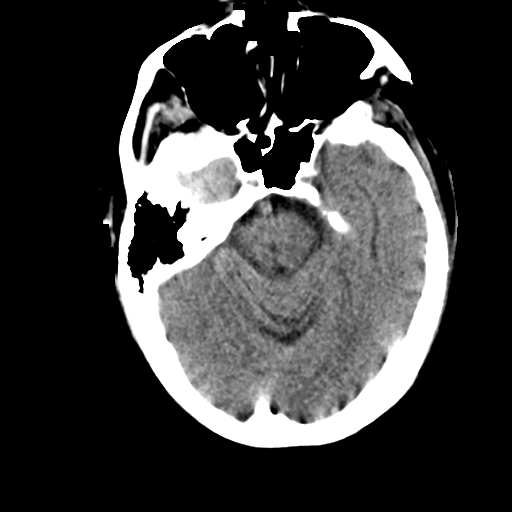
[im 18/35  brain]
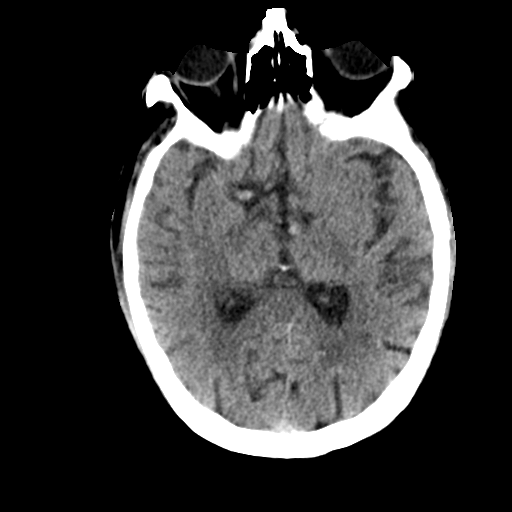
[im 22/35  brain]
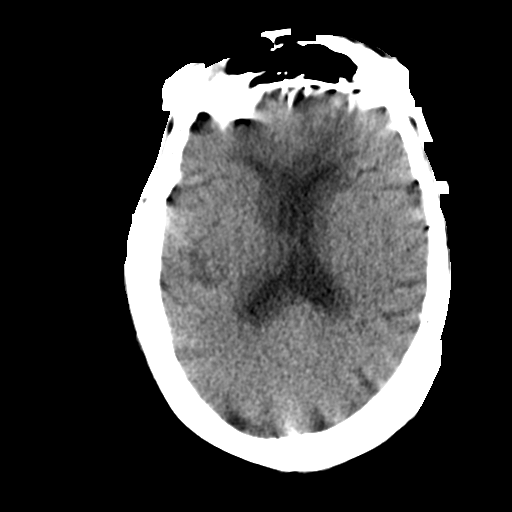
[im 22/35  bone]
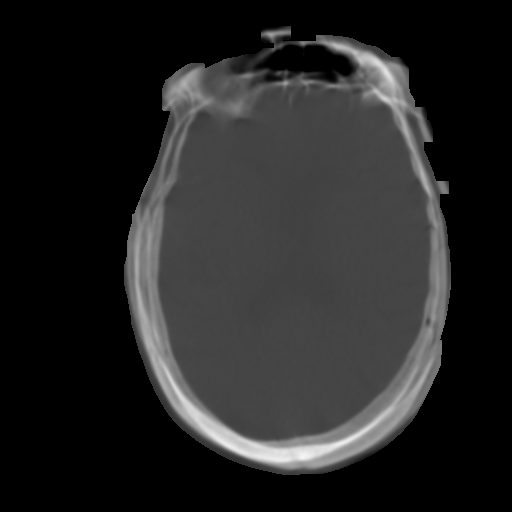
[im 26/35  brain]
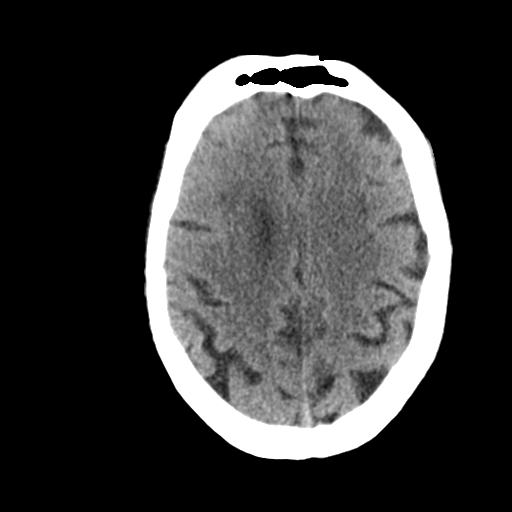
[im 30/35  brain]
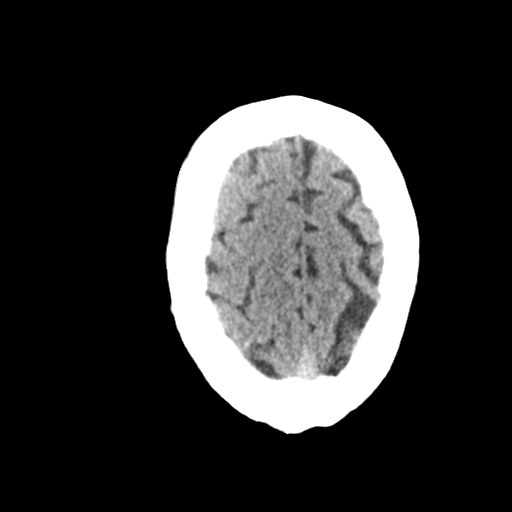

[Series 4: head 3.0 mpr cor · coronal · 0.32mm/px · 3 of 69 slices shown]
[im 18/69  brain]
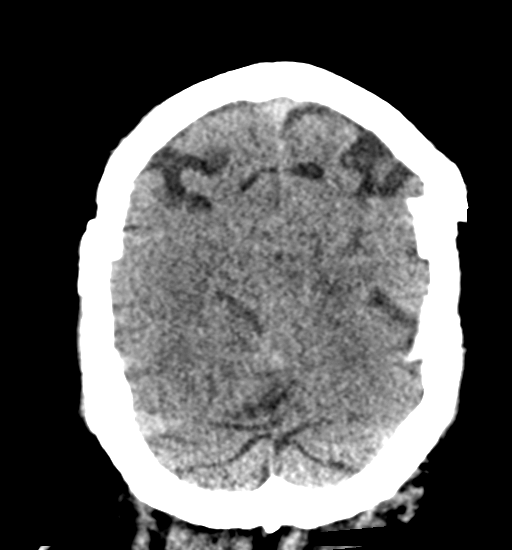
[im 35/69  brain]
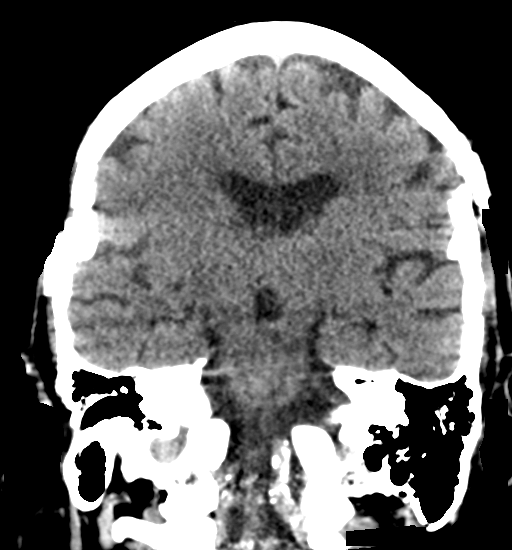
[im 52/69  brain]
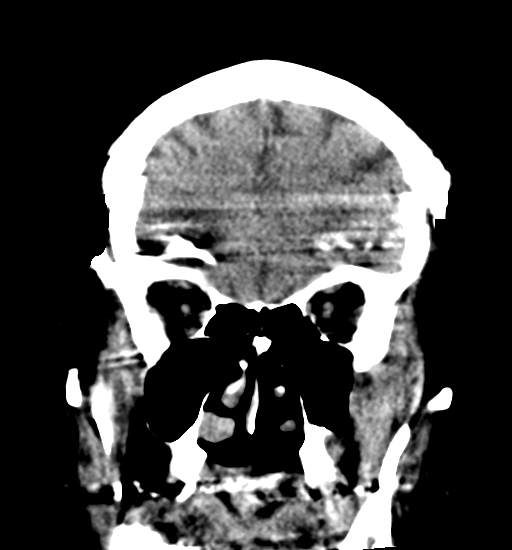

[Series 7: head 2.0 h70h · axial · 0.42mm/px · z∈[-89,-71]mm · 2 of 63 slices shown]
[im 5/63  brain]
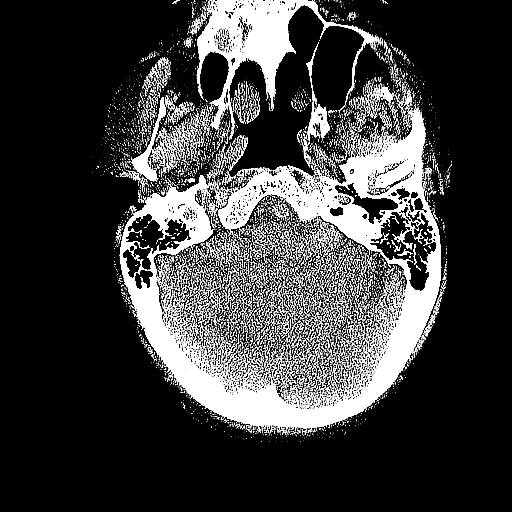
[im 14/63  brain]
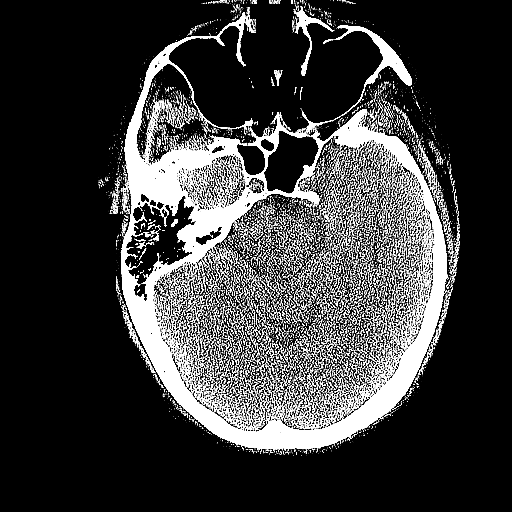

[Series 9: head 3.0 mpr sag · sagittal · 0.25mm/px · 3 of 57 slices shown]
[im 29/57  brain]
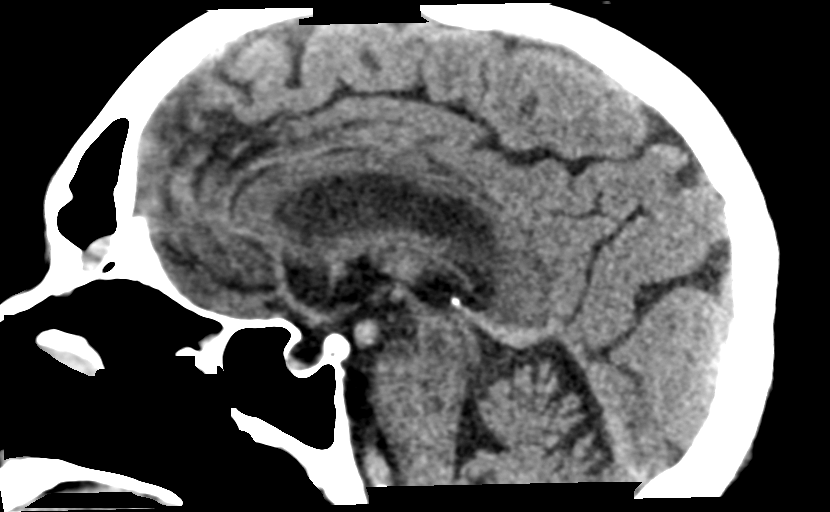
[im 38/57  brain]
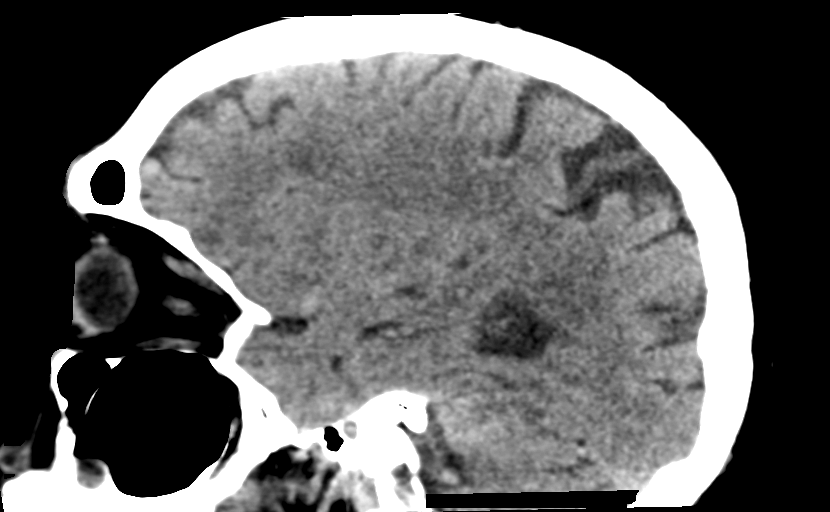
[im 47/57  brain]
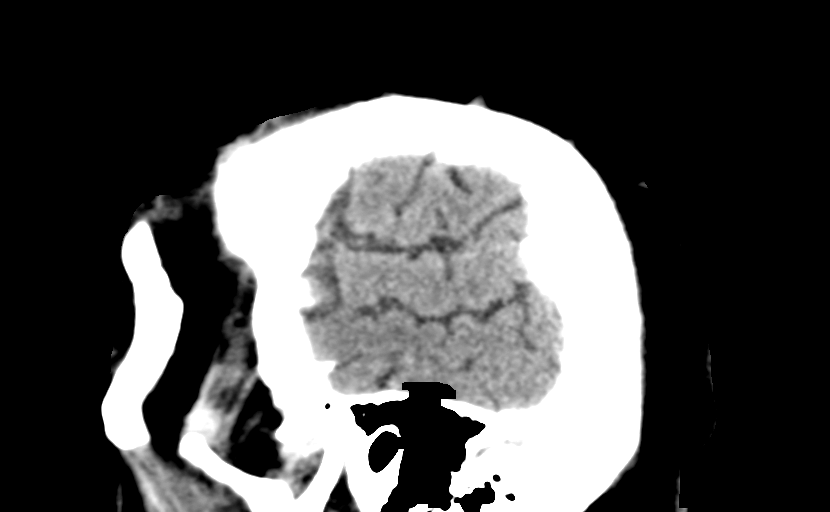

[15 of 47 positions shown; findings below may reference images not displayed]

FINDINGS: Brain: There is significant central and cortical atrophy.
Periventricular white matter changes are extensive in appears
stable. Chronic lacunar infarct of the left basal ganglia. There is
no intra or extra-axial fluid collection or mass lesion. The basilar
cisterns and ventricles have a normal appearance. There is no CT
evidence for acute infarction or hemorrhage.

Vascular: There is atherosclerotic calcification of the carotid
siphons.

Skull: Normal. Negative for fracture or focal lesion.

Sinuses/Orbits: Mild mucosal thickening noted within the paranasal
sinuses. Orbits are unremarkable. Mastoid air cells are normally
aerated.

Other: None
IMPRESSION: 1. Significant atrophy and small vessel disease, stable in
appearance.
2. Chronic lacunar infarct of the left basal ganglia.
3.  No evidence for acute intracranial abnormality.
4. Mild mucosal thickening of the paranasal sinuses.
